# Patient Record
Sex: Female | Born: 1940 | Race: White | Hispanic: No | Smoking: Never smoker
Health system: Southern US, Community
[De-identification: ages and names within clinical notes are randomized; demographics above are authoritative.]

## PROBLEM LIST (undated history)

## (undated) DIAGNOSIS — Z923 Personal history of irradiation: Secondary | ICD-10-CM

## (undated) DIAGNOSIS — Z9889 Other specified postprocedural states: Secondary | ICD-10-CM

## (undated) DIAGNOSIS — Z8673 Personal history of transient ischemic attack (TIA), and cerebral infarction without residual deficits: Secondary | ICD-10-CM

## (undated) DIAGNOSIS — Z8619 Personal history of other infectious and parasitic diseases: Secondary | ICD-10-CM

## (undated) DIAGNOSIS — R112 Nausea with vomiting, unspecified: Secondary | ICD-10-CM

## (undated) DIAGNOSIS — E785 Hyperlipidemia, unspecified: Secondary | ICD-10-CM

## (undated) DIAGNOSIS — Z87440 Personal history of urinary (tract) infections: Secondary | ICD-10-CM

## (undated) DIAGNOSIS — K5790 Diverticulosis of intestine, part unspecified, without perforation or abscess without bleeding: Secondary | ICD-10-CM

## (undated) DIAGNOSIS — Z9289 Personal history of other medical treatment: Secondary | ICD-10-CM

## (undated) DIAGNOSIS — I10 Essential (primary) hypertension: Secondary | ICD-10-CM

## (undated) DIAGNOSIS — M6530 Trigger finger, unspecified finger: Secondary | ICD-10-CM

## (undated) DIAGNOSIS — C50919 Malignant neoplasm of unspecified site of unspecified female breast: Secondary | ICD-10-CM

## (undated) DIAGNOSIS — K649 Unspecified hemorrhoids: Secondary | ICD-10-CM

## (undated) DIAGNOSIS — Z5189 Encounter for other specified aftercare: Secondary | ICD-10-CM

## (undated) HISTORY — DX: Personal history of other infectious and parasitic diseases: Z86.19

## (undated) HISTORY — PX: BREAST LUMPECTOMY: SHX2

## (undated) HISTORY — PX: LAPAROSCOPIC HYSTERECTOMY: SHX1926

## (undated) HISTORY — PX: ESOPHAGOGASTRODUODENOSCOPY: SHX1529

## (undated) HISTORY — DX: Personal history of transient ischemic attack (TIA), and cerebral infarction without residual deficits: Z86.73

## (undated) HISTORY — DX: Trigger finger, unspecified finger: M65.30

## (undated) HISTORY — DX: Diverticulosis of intestine, part unspecified, without perforation or abscess without bleeding: K57.90

## (undated) HISTORY — PX: OTHER SURGICAL HISTORY: SHX169

## (undated) HISTORY — DX: Personal history of other medical treatment: Z92.89

## (undated) HISTORY — PX: ABDOMINAL HYSTERECTOMY: SHX81

## (undated) HISTORY — DX: Personal history of urinary (tract) infections: Z87.440

## (undated) HISTORY — PX: WRIST SURGERY: SHX841

## (undated) HISTORY — DX: Hyperlipidemia, unspecified: E78.5

## (undated) HISTORY — DX: Unspecified hemorrhoids: K64.9

---
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
## 1984-07-03 HISTORY — PX: BREAST BIOPSY: SHX20

## 1986-07-03 HISTORY — PX: BREAST BIOPSY: SHX20

## 1993-07-03 HISTORY — PX: BREAST BIOPSY: SHX20

## 2004-06-23 ENCOUNTER — Ambulatory Visit: Payer: Self-pay | Admitting: Internal Medicine

## 2004-07-12 ENCOUNTER — Ambulatory Visit: Payer: Self-pay | Admitting: Internal Medicine

## 2004-07-15 ENCOUNTER — Ambulatory Visit: Payer: Self-pay | Admitting: General Practice

## 2005-01-11 ENCOUNTER — Ambulatory Visit: Payer: Self-pay | Admitting: Internal Medicine

## 2005-03-22 ENCOUNTER — Ambulatory Visit: Payer: Self-pay | Admitting: General Practice

## 2005-07-13 ENCOUNTER — Ambulatory Visit: Payer: Self-pay | Admitting: Internal Medicine

## 2006-07-16 ENCOUNTER — Ambulatory Visit: Payer: Self-pay | Admitting: Internal Medicine

## 2007-07-04 DIAGNOSIS — C50919 Malignant neoplasm of unspecified site of unspecified female breast: Secondary | ICD-10-CM

## 2007-07-04 HISTORY — DX: Malignant neoplasm of unspecified site of unspecified female breast: C50.919

## 2007-07-04 HISTORY — PX: BREAST BIOPSY: SHX20

## 2007-07-18 ENCOUNTER — Ambulatory Visit: Payer: Self-pay | Admitting: Internal Medicine

## 2007-07-19 ENCOUNTER — Ambulatory Visit: Payer: Self-pay | Admitting: Internal Medicine

## 2007-08-07 ENCOUNTER — Ambulatory Visit: Payer: Self-pay | Admitting: General Surgery

## 2007-08-19 ENCOUNTER — Ambulatory Visit: Payer: Self-pay | Admitting: General Surgery

## 2007-08-26 ENCOUNTER — Ambulatory Visit: Payer: Self-pay | Admitting: Oncology

## 2007-09-01 ENCOUNTER — Ambulatory Visit: Payer: Self-pay | Admitting: Oncology

## 2007-09-02 ENCOUNTER — Ambulatory Visit: Payer: Self-pay | Admitting: Oncology

## 2007-10-02 ENCOUNTER — Ambulatory Visit: Payer: Self-pay | Admitting: Oncology

## 2007-11-01 ENCOUNTER — Ambulatory Visit: Payer: Self-pay | Admitting: Oncology

## 2007-12-02 ENCOUNTER — Ambulatory Visit: Payer: Self-pay | Admitting: Oncology

## 2008-01-01 ENCOUNTER — Ambulatory Visit: Payer: Self-pay | Admitting: Oncology

## 2008-02-01 ENCOUNTER — Ambulatory Visit: Payer: Self-pay | Admitting: Oncology

## 2008-03-03 ENCOUNTER — Ambulatory Visit: Payer: Self-pay | Admitting: Oncology

## 2008-03-18 ENCOUNTER — Ambulatory Visit: Payer: Self-pay | Admitting: General Surgery

## 2008-03-25 ENCOUNTER — Ambulatory Visit: Payer: Self-pay | Admitting: Oncology

## 2008-04-02 ENCOUNTER — Ambulatory Visit: Payer: Self-pay | Admitting: Oncology

## 2008-07-03 ENCOUNTER — Ambulatory Visit: Payer: Self-pay | Admitting: Radiation Oncology

## 2008-07-13 ENCOUNTER — Ambulatory Visit: Payer: Self-pay | Admitting: Radiation Oncology

## 2008-07-20 ENCOUNTER — Ambulatory Visit: Payer: Self-pay | Admitting: Oncology

## 2008-08-03 ENCOUNTER — Ambulatory Visit: Payer: Self-pay | Admitting: Radiation Oncology

## 2008-08-12 ENCOUNTER — Ambulatory Visit: Payer: Self-pay | Admitting: Radiation Oncology

## 2008-08-31 ENCOUNTER — Ambulatory Visit: Payer: Self-pay | Admitting: Radiation Oncology

## 2008-09-23 ENCOUNTER — Ambulatory Visit: Payer: Self-pay | Admitting: Oncology

## 2008-10-01 ENCOUNTER — Ambulatory Visit: Payer: Self-pay | Admitting: Radiation Oncology

## 2008-12-31 ENCOUNTER — Ambulatory Visit: Payer: Self-pay | Admitting: Oncology

## 2009-01-19 ENCOUNTER — Ambulatory Visit: Payer: Self-pay | Admitting: Oncology

## 2009-01-22 ENCOUNTER — Ambulatory Visit: Payer: Self-pay | Admitting: Oncology

## 2009-01-31 ENCOUNTER — Ambulatory Visit: Payer: Self-pay | Admitting: Oncology

## 2009-04-02 ENCOUNTER — Ambulatory Visit: Payer: Self-pay | Admitting: Oncology

## 2009-04-05 ENCOUNTER — Ambulatory Visit: Payer: Self-pay | Admitting: Oncology

## 2009-05-03 ENCOUNTER — Ambulatory Visit: Payer: Self-pay | Admitting: Oncology

## 2009-07-19 ENCOUNTER — Ambulatory Visit: Payer: Self-pay | Admitting: Internal Medicine

## 2009-10-01 ENCOUNTER — Ambulatory Visit: Payer: Self-pay | Admitting: Oncology

## 2009-10-06 ENCOUNTER — Ambulatory Visit: Payer: Self-pay | Admitting: Oncology

## 2009-10-31 ENCOUNTER — Ambulatory Visit: Payer: Self-pay | Admitting: Oncology

## 2009-12-31 ENCOUNTER — Ambulatory Visit: Payer: Self-pay | Admitting: Oncology

## 2010-01-07 ENCOUNTER — Ambulatory Visit: Payer: Self-pay | Admitting: Oncology

## 2010-01-31 ENCOUNTER — Ambulatory Visit: Payer: Self-pay | Admitting: Oncology

## 2010-02-16 ENCOUNTER — Ambulatory Visit: Payer: Self-pay | Admitting: Internal Medicine

## 2010-04-02 ENCOUNTER — Ambulatory Visit: Payer: Self-pay | Admitting: Oncology

## 2010-04-07 ENCOUNTER — Ambulatory Visit: Payer: Self-pay | Admitting: Oncology

## 2010-05-03 ENCOUNTER — Ambulatory Visit: Payer: Self-pay | Admitting: Oncology

## 2010-08-22 ENCOUNTER — Ambulatory Visit: Payer: Self-pay | Admitting: Internal Medicine

## 2010-08-26 ENCOUNTER — Ambulatory Visit: Payer: Self-pay | Admitting: Oncology

## 2010-09-01 ENCOUNTER — Ambulatory Visit: Payer: Self-pay | Admitting: Oncology

## 2011-01-20 ENCOUNTER — Ambulatory Visit: Payer: Self-pay | Admitting: Oncology

## 2011-02-01 ENCOUNTER — Ambulatory Visit: Payer: Self-pay | Admitting: Oncology

## 2011-03-31 ENCOUNTER — Ambulatory Visit: Payer: Self-pay | Admitting: Oncology

## 2011-04-03 ENCOUNTER — Ambulatory Visit: Payer: Self-pay | Admitting: Oncology

## 2011-08-24 ENCOUNTER — Ambulatory Visit: Payer: Self-pay | Admitting: Internal Medicine

## 2011-10-03 ENCOUNTER — Ambulatory Visit: Payer: Self-pay | Admitting: Oncology

## 2011-10-03 LAB — CBC CANCER CENTER
Basophil #: 0 x10 3/mm (ref 0.0–0.1)
Basophil %: 0.3 %
Eosinophil #: 0.1 x10 3/mm (ref 0.0–0.7)
HGB: 13.7 g/dL (ref 12.0–16.0)
Lymphocyte #: 1.1 x10 3/mm (ref 1.0–3.6)
Lymphocyte %: 16.3 %
MCHC: 34.8 g/dL (ref 32.0–36.0)
MCV: 90 fL (ref 80–100)
Neutrophil #: 4.9 x10 3/mm (ref 1.4–6.5)
Platelet: 306 x10 3/mm (ref 150–440)
RDW: 14.3 % (ref 11.5–14.5)

## 2011-10-03 LAB — COMPREHENSIVE METABOLIC PANEL
Albumin: 4 g/dL (ref 3.4–5.0)
Alkaline Phosphatase: 94 U/L (ref 50–136)
Anion Gap: 9 (ref 7–16)
BUN: 10 mg/dL (ref 7–18)
Bilirubin,Total: 0.5 mg/dL (ref 0.2–1.0)
Chloride: 100 mmol/L (ref 98–107)
Co2: 30 mmol/L (ref 21–32)
EGFR (African American): >60
EGFR (Non-African Amer.): >60
Glucose: 86 mg/dL (ref 65–99)
Osmolality: 276 (ref 275–301)
Potassium: 4 mmol/L (ref 3.5–5.1)
Sodium: 139 mmol/L (ref 136–145)
Total Protein: 7.6 g/dL (ref 6.4–8.2)

## 2011-11-01 ENCOUNTER — Ambulatory Visit: Payer: Self-pay | Admitting: Oncology

## 2012-01-26 ENCOUNTER — Ambulatory Visit: Payer: Self-pay | Admitting: Radiation Oncology

## 2012-02-01 ENCOUNTER — Ambulatory Visit: Payer: Self-pay | Admitting: Radiation Oncology

## 2012-04-02 ENCOUNTER — Ambulatory Visit: Payer: Self-pay | Admitting: Oncology

## 2012-04-02 LAB — CBC CANCER CENTER
Basophil #: 0 x10 3/mm (ref 0.0–0.1)
HCT: 42.5 % (ref 35.0–47.0)
HGB: 14.1 g/dL (ref 12.0–16.0)
Lymphocyte #: 1.3 x10 3/mm (ref 1.0–3.6)
Lymphocyte %: 21.3 %
MCH: 30 pg (ref 26.0–34.0)
Monocyte #: 0.4 x10 3/mm (ref 0.2–0.9)
Monocyte %: 7.3 %
Neutrophil %: 68.3 %
Platelet: 297 x10 3/mm (ref 150–440)
RBC: 4.69 10*6/uL (ref 3.80–5.20)
RDW: 13.4 % (ref 11.5–14.5)
WBC: 5.9 x10 3/mm (ref 3.6–11.0)

## 2012-04-02 LAB — COMPREHENSIVE METABOLIC PANEL
Albumin: 4 g/dL (ref 3.4–5.0)
Alkaline Phosphatase: 95 U/L (ref 50–136)
Anion Gap: 13 (ref 7–16)
BUN: 10 mg/dL (ref 7–18)
Bilirubin,Total: 0.5 mg/dL (ref 0.2–1.0)
Calcium, Total: 9.3 mg/dL (ref 8.5–10.1)
Creatinine: 0.76 mg/dL (ref 0.60–1.30)
Glucose: 100 mg/dL — ABNORMAL HIGH (ref 65–99)
Potassium: 3.9 mmol/L (ref 3.5–5.1)
SGOT(AST): 19 U/L (ref 15–37)
SGPT (ALT): 18 U/L (ref 12–78)
Total Protein: 7.7 g/dL (ref 6.4–8.2)

## 2012-05-03 ENCOUNTER — Ambulatory Visit: Payer: Self-pay | Admitting: Oncology

## 2012-08-27 ENCOUNTER — Ambulatory Visit: Payer: Self-pay | Admitting: Internal Medicine

## 2012-10-01 ENCOUNTER — Ambulatory Visit: Payer: Self-pay | Admitting: Oncology

## 2012-10-01 LAB — COMPREHENSIVE METABOLIC PANEL
Alkaline Phosphatase: 93 U/L (ref 50–136)
Bilirubin,Total: 0.6 mg/dL (ref 0.2–1.0)
Calcium, Total: 9 mg/dL (ref 8.5–10.1)
Co2: 30 mmol/L (ref 21–32)
Creatinine: 0.87 mg/dL (ref 0.60–1.30)
EGFR (African American): >60
EGFR (Non-African Amer.): >60
Osmolality: 280 (ref 275–301)
Sodium: 141 mmol/L (ref 136–145)

## 2012-10-01 LAB — CBC CANCER CENTER
Basophil #: 0 x10 3/mm (ref 0.0–0.1)
Basophil %: 0.9 %
HCT: 41.8 % (ref 35.0–47.0)
Lymphocyte %: 23 %
MCV: 89 fL (ref 80–100)
Monocyte #: 0.5 x10 3/mm (ref 0.2–0.9)
Monocyte %: 9.5 %
Neutrophil #: 3.6 x10 3/mm (ref 1.4–6.5)
RBC: 4.7 10*6/uL (ref 3.80–5.20)
RDW: 14.2 % (ref 11.5–14.5)
WBC: 5.6 x10 3/mm (ref 3.6–11.0)

## 2012-10-02 LAB — CANCER ANTIGEN 27.29: CA 27.29: 29.4 U/mL (ref 0.0–38.6)

## 2012-10-31 ENCOUNTER — Ambulatory Visit: Payer: Self-pay | Admitting: Oncology

## 2012-12-31 ENCOUNTER — Ambulatory Visit: Payer: Self-pay | Admitting: Oncology

## 2013-01-31 ENCOUNTER — Ambulatory Visit: Payer: Self-pay | Admitting: Oncology

## 2013-06-05 ENCOUNTER — Ambulatory Visit: Payer: Self-pay | Admitting: Internal Medicine

## 2013-06-06 ENCOUNTER — Ambulatory Visit: Payer: Self-pay | Admitting: Internal Medicine

## 2013-07-31 ENCOUNTER — Ambulatory Visit: Payer: Self-pay | Admitting: Gastroenterology

## 2013-07-31 HISTORY — PX: COLONOSCOPY: SHX174

## 2013-09-03 ENCOUNTER — Ambulatory Visit: Payer: Self-pay | Admitting: Internal Medicine

## 2013-10-01 ENCOUNTER — Ambulatory Visit: Payer: Self-pay | Admitting: Oncology

## 2013-10-01 LAB — COMPREHENSIVE METABOLIC PANEL
ALBUMIN: 3.8 g/dL (ref 3.4–5.0)
ALT: 17 U/L (ref 12–78)
Alkaline Phosphatase: 99 U/L
Anion Gap: 8 (ref 7–16)
BUN: 11 mg/dL (ref 7–18)
Bilirubin,Total: 0.5 mg/dL (ref 0.2–1.0)
Calcium, Total: 9.2 mg/dL (ref 8.5–10.1)
Chloride: 101 mmol/L (ref 98–107)
Co2: 29 mmol/L (ref 21–32)
Creatinine: 0.71 mg/dL (ref 0.60–1.30)
EGFR (African American): >60
EGFR (Non-African Amer.): >60
Glucose: 102 mg/dL — ABNORMAL HIGH (ref 65–99)
OSMOLALITY: 275 (ref 275–301)
Potassium: 4.2 mmol/L (ref 3.5–5.1)
SGOT(AST): 21 U/L (ref 15–37)
Sodium: 138 mmol/L (ref 136–145)
TOTAL PROTEIN: 7.4 g/dL (ref 6.4–8.2)

## 2013-10-01 LAB — CBC CANCER CENTER
Basophil #: 0.1 x10 3/mm (ref 0.0–0.1)
Basophil %: 0.9 %
Eosinophil #: 0.2 x10 3/mm (ref 0.0–0.7)
Eosinophil %: 2.3 %
HCT: 40.5 % (ref 35.0–47.0)
HGB: 13.6 g/dL (ref 12.0–16.0)
LYMPHS ABS: 1.2 x10 3/mm (ref 1.0–3.6)
LYMPHS PCT: 16.2 %
MCH: 29.6 pg (ref 26.0–34.0)
MCHC: 33.6 g/dL (ref 32.0–36.0)
MCV: 88 fL (ref 80–100)
Monocyte #: 0.5 x10 3/mm (ref 0.2–0.9)
Monocyte %: 6.5 %
Neutrophil #: 5.6 x10 3/mm (ref 1.4–6.5)
Neutrophil %: 74.1 %
Platelet: 322 x10 3/mm (ref 150–440)
RBC: 4.59 10*6/uL (ref 3.80–5.20)
RDW: 14 % (ref 11.5–14.5)
WBC: 7.6 x10 3/mm (ref 3.6–11.0)

## 2013-10-02 LAB — CANCER ANTIGEN 27.29: CA 27.29: 21 U/mL (ref 0.0–38.6)

## 2013-10-31 ENCOUNTER — Ambulatory Visit: Payer: Self-pay | Admitting: Oncology

## 2013-11-20 DIAGNOSIS — E7800 Pure hypercholesterolemia, unspecified: Secondary | ICD-10-CM | POA: Insufficient documentation

## 2014-03-26 ENCOUNTER — Emergency Department: Payer: Self-pay | Admitting: Emergency Medicine

## 2014-09-07 ENCOUNTER — Ambulatory Visit: Payer: Self-pay | Admitting: Internal Medicine

## 2014-09-17 ENCOUNTER — Emergency Department: Payer: Self-pay | Admitting: Internal Medicine

## 2014-10-05 ENCOUNTER — Ambulatory Visit
Admit: 2014-10-05 | Disposition: A | Discharge status: Home / Self Care | Payer: Self-pay | Attending: Oncology | Admitting: Oncology

## 2014-10-05 LAB — CBC CANCER CENTER
BASOS ABS: 0.1 x10 3/mm (ref 0.0–0.1)
BASOS PCT: 1.1 %
EOS PCT: 3.5 %
Eosinophil #: 0.2 x10 3/mm (ref 0.0–0.7)
HCT: 40.7 % (ref 35.0–47.0)
HGB: 13.8 g/dL (ref 12.0–16.0)
Lymphocyte #: 1.3 x10 3/mm (ref 1.0–3.6)
Lymphocyte %: 25.2 %
MCH: 30.1 pg (ref 26.0–34.0)
MCHC: 34 g/dL (ref 32.0–36.0)
MCV: 89 fL (ref 80–100)
MONOS PCT: 7.1 %
Monocyte #: 0.4 x10 3/mm (ref 0.2–0.9)
NEUTROS ABS: 3.2 x10 3/mm (ref 1.4–6.5)
Neutrophil %: 63.1 %
Platelet: 394 x10 3/mm (ref 150–440)
RBC: 4.6 10*6/uL (ref 3.80–5.20)
RDW: 14.3 % (ref 11.5–14.5)
WBC: 5 x10 3/mm (ref 3.6–11.0)

## 2014-10-05 LAB — COMPREHENSIVE METABOLIC PANEL
ALBUMIN: 4.6 g/dL
ANION GAP: 7 (ref 7–16)
Alkaline Phosphatase: 110 U/L
BUN: 10 mg/dL
Bilirubin,Total: 0.7 mg/dL
CALCIUM: 9.1 mg/dL
CO2: 29 mmol/L
Chloride: 96 mmol/L — ABNORMAL LOW
Creatinine: 0.65 mg/dL
EGFR (Non-African Amer.): >60
Glucose: 132 mg/dL — ABNORMAL HIGH
POTASSIUM: 3.7 mmol/L
SGOT(AST): 25 U/L
SGPT (ALT): 19 U/L
Sodium: 132 mmol/L — ABNORMAL LOW
Total Protein: 7.6 g/dL

## 2014-11-04 ENCOUNTER — Other Ambulatory Visit: Payer: Self-pay | Admitting: *Deleted

## 2014-11-04 DIAGNOSIS — Z853 Personal history of malignant neoplasm of breast: Secondary | ICD-10-CM

## 2015-09-13 ENCOUNTER — Ambulatory Visit
Admission: RE | Admit: 2015-09-13 | Discharge: 2015-09-13 | Disposition: A | Discharge status: Home / Self Care | Payer: Commercial Managed Care - HMO | Source: Ambulatory Visit | Attending: Oncology | Admitting: Oncology

## 2015-09-13 ENCOUNTER — Other Ambulatory Visit: Payer: Self-pay | Admitting: Oncology

## 2015-09-13 DIAGNOSIS — Z853 Personal history of malignant neoplasm of breast: Secondary | ICD-10-CM

## 2015-09-13 DIAGNOSIS — Z1231 Encounter for screening mammogram for malignant neoplasm of breast: Secondary | ICD-10-CM | POA: Insufficient documentation

## 2015-09-13 HISTORY — DX: Malignant neoplasm of unspecified site of unspecified female breast: C50.919

## 2015-10-11 ENCOUNTER — Other Ambulatory Visit: Payer: Self-pay

## 2015-10-11 ENCOUNTER — Ambulatory Visit: Payer: Self-pay | Admitting: Oncology

## 2015-11-01 ENCOUNTER — Other Ambulatory Visit: Payer: Self-pay | Admitting: *Deleted

## 2015-11-01 DIAGNOSIS — C50919 Malignant neoplasm of unspecified site of unspecified female breast: Secondary | ICD-10-CM

## 2015-11-04 ENCOUNTER — Encounter: Payer: Self-pay | Admitting: *Deleted

## 2015-11-04 ENCOUNTER — Inpatient Hospital Stay: Payer: Commercial Managed Care - HMO | Attending: Internal Medicine | Admitting: Internal Medicine

## 2015-11-04 ENCOUNTER — Other Ambulatory Visit: Payer: Self-pay | Admitting: *Deleted

## 2015-11-04 ENCOUNTER — Inpatient Hospital Stay: Payer: Commercial Managed Care - HMO

## 2015-11-04 ENCOUNTER — Encounter: Payer: Self-pay | Admitting: Internal Medicine

## 2015-11-04 VITALS — BP 147/85 | HR 75 | Temp 95.5°F | Resp 20 | Wt 135.5 lb

## 2015-11-04 DIAGNOSIS — Z9223 Personal history of estrogen therapy: Secondary | ICD-10-CM | POA: Diagnosis not present

## 2015-11-04 DIAGNOSIS — C50912 Malignant neoplasm of unspecified site of left female breast: Secondary | ICD-10-CM

## 2015-11-04 DIAGNOSIS — Z853 Personal history of malignant neoplasm of breast: Secondary | ICD-10-CM | POA: Diagnosis not present

## 2015-11-04 DIAGNOSIS — Z923 Personal history of irradiation: Secondary | ICD-10-CM | POA: Diagnosis not present

## 2015-11-04 DIAGNOSIS — Z170 Estrogen receptor positive status [ER+]: Secondary | ICD-10-CM

## 2015-11-04 LAB — CBC WITH DIFFERENTIAL/PLATELET
Basophils Absolute: 0 10*3/uL (ref 0–0.1)
Basophils Relative: 1 %
EOS ABS: 0.2 10*3/uL (ref 0–0.7)
EOS PCT: 4 %
HCT: 41.2 % (ref 35.0–47.0)
Hemoglobin: 14.2 g/dL (ref 12.0–16.0)
LYMPHS ABS: 1.2 10*3/uL (ref 1.0–3.6)
Lymphocytes Relative: 24 %
MCH: 30.2 pg (ref 26.0–34.0)
MCHC: 34.4 g/dL (ref 32.0–36.0)
MCV: 87.7 fL (ref 80.0–100.0)
MONO ABS: 0.5 10*3/uL (ref 0.2–0.9)
Monocytes Relative: 9 %
Neutro Abs: 3.1 10*3/uL (ref 1.4–6.5)
Neutrophils Relative %: 62 %
Platelets: 301 10*3/uL (ref 150–440)
RBC: 4.7 MIL/uL (ref 3.80–5.20)
RDW: 15.4 % — AB (ref 11.5–14.5)
WBC: 5 10*3/uL (ref 3.6–11.0)

## 2015-11-04 LAB — COMPREHENSIVE METABOLIC PANEL
ALT: 12 U/L — AB (ref 14–54)
AST: 20 U/L (ref 15–41)
Albumin: 4.3 g/dL (ref 3.5–5.0)
Alkaline Phosphatase: 81 U/L (ref 38–126)
Anion gap: 8 (ref 5–15)
BILIRUBIN TOTAL: 0.8 mg/dL (ref 0.3–1.2)
BUN: 12 mg/dL (ref 6–20)
CO2: 29 mmol/L (ref 22–32)
CREATININE: 0.63 mg/dL (ref 0.44–1.00)
Calcium: 9.4 mg/dL (ref 8.9–10.3)
Chloride: 100 mmol/L — ABNORMAL LOW (ref 101–111)
GFR calc Af Amer: >60 mL/min (ref >60)
GLUCOSE: 99 mg/dL (ref 65–99)
Potassium: 4.1 mmol/L (ref 3.5–5.1)
Sodium: 137 mmol/L (ref 135–145)
TOTAL PROTEIN: 7.4 g/dL (ref 6.5–8.1)

## 2015-11-04 NOTE — Progress Notes (Signed)
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 I reviewed the patient's prior charts/pertinent labs/imaging in detail; findings are summarized above.   Patient is doing well. Denies any unusual chest pain or shortness of breath or cough. No bone pain. She continues to work as a Administrator, arts on Fridays.   REVIEW OF SYSTEMS:  A complete 10 point review of system is done which is negative except mentioned above/history of present illness.   PAST MEDICAL HISTORY :  Past Medical History  Diagnosis Date  . Breast cancer ([place]) 2009    left breast, radiation  . History of bladder infections     PAST SURGICAL HISTORY :   Past Surgical History  Procedure Laterality Date  . Breast biopsy Left 1986    neg  . Breast biopsy Left 1988    neg  . Breast biopsy Left 1995    neg  . Breast lumpectomy    . Appendectomy    . Laparoscopic hysterectomy      FAMILY HISTORY :  No family history on file.  SOCIAL HISTORY:   Social History  Substance Use Topics  . Smoking status: Never Smoker   . Smokeless tobacco: Never Used  . Alcohol Use: No    ALLERGIES:  is allergic to levofloxacin and other.  MEDICATIONS:  Current Outpatient Prescriptions  Medication Sig Dispense Refill  . aspirin EC 81 MG tablet Take 81 mg by mouth.    . diphenhydrAMINE (BENADRYL) 25 mg capsule Take 25 mg by mouth Nightly.    . gabapentin (NEURONTIN) 300 MG capsule 300 mg 3 (three) times daily.    Marland Kitchen guaiFENesin-codeine (ROBITUSSIN AC) 100-10 MG/5ML syrup Take 5 mLs by mouth 2 (two) times daily.    . pravastatin (PRAVACHOL) 40 MG  tablet 40 mg.     No current facility-administered medications for this visit.    PHYSICAL EXAMINATION: ECOG PERFORMANCE STATUS: 0 - Asymptomatic  BP 147/85 mmHg  Pulse 75  Temp(Src) 95.5 F (35.3 C) (Tympanic)  Wt 135 lb 7.6 oz (61.45 kg)  Filed Weights   11/04/15 1119  Weight: 135 lb 7.6 oz (61.45 kg)    GENERAL: Well-nourished well-developed; Alert, no distress and comfortable.  Alone. EYES: no pallor or icterus OROPHARYNX: no thrush or ulceration; good dentition  NECK: supple, no masses felt LYMPH:  no palpable lymphadenopathy in the cervical, axillary or inguinal regions LUNGS: clear to auscultation and  No wheeze or crackles HEART/CVS: regular rate & rhythm and no murmurs; No lower extremity edema ABDOMEN:abdomen soft, non-tender and normal bowel sounds Musculoskeletal:no cyanosis of digits and no clubbing  PSYCH: alert & oriented x 3 with fluent speech NEURO: no focal motor/sensory deficits SKIN:  no rashes or significant lesions Right and left BREAST exam [in the presence of nurse]- no unusual skin changes or dominant masses felt. Surgical scar left outer upper quadrant noted.    LABORATORY DATA:  I have reviewed the data as listed    Component Value Date/Time   NA 137 11/04/2015 1031   NA 132* 10/05/2014 0951   K 4.1 11/04/2015 1031   K 3.7 10/05/2014  0951   CL 100* 11/04/2015 1031   CL 96* 10/05/2014 0951   CO2 29 11/04/2015 1031   CO2 29 10/05/2014 0951   GLUCOSE 99 11/04/2015 1031   GLUCOSE 132* 10/05/2014 0951   BUN 12 11/04/2015 1031   BUN 10 10/05/2014 0951   CREATININE 0.63 11/04/2015 1031   CREATININE 0.65 10/05/2014 0951   CALCIUM 9.4 11/04/2015 1031   CALCIUM 9.1 10/05/2014 0951   PROT 7.4 11/04/2015 1031   PROT 7.6 10/05/2014 0951   ALBUMIN 4.3 11/04/2015 1031   ALBUMIN 4.6 10/05/2014 0951   AST 20 11/04/2015 1031   AST 25 10/05/2014 0951   ALT 12* 11/04/2015 1031   ALT 19 10/05/2014 0951   ALKPHOS 81 11/04/2015 1031   ALKPHOS 110  10/05/2014 0951   BILITOT 0.8 11/04/2015 1031   BILITOT 0.7 10/05/2014 0951   GFRNONAA >60 11/04/2015 1031   GFRNONAA >60 10/05/2014 0951   GFRNONAA >60 10/03/2011 1117   GFRAA >60 11/04/2015 1031   GFRAA >60 10/05/2014 0951   GFRAA >60 10/03/2011 1117    No results found for: SPEP, UPEP  Lab Results  Component Value Date   WBC 5.0 11/04/2015   NEUTROABS 3.1 11/04/2015   HGB 14.2 11/04/2015   HCT 41.2 11/04/2015   MCV 87.7 11/04/2015   PLT 301 11/04/2015      Chemistry      Component Value Date/Time   NA 137 11/04/2015 1031   NA 132* 10/05/2014 0951   K 4.1 11/04/2015 1031   K 3.7 10/05/2014 0951   CL 100* 11/04/2015 1031   CL 96* 10/05/2014 0951   CO2 29 11/04/2015 1031   CO2 29 10/05/2014 0951   BUN 12 11/04/2015 1031   BUN 10 10/05/2014 0951   CREATININE 0.63 11/04/2015 1031   CREATININE 0.65 10/05/2014 0951      Component Value Date/Time   CALCIUM 9.4 11/04/2015 1031   CALCIUM 9.1 10/05/2014 0951   ALKPHOS 81 11/04/2015 1031   ALKPHOS 110 10/05/2014 0951   AST 20 11/04/2015 1031   AST 25 10/05/2014 0951   ALT 12* 11/04/2015 1031   ALT 19 10/05/2014 0951   BILITOT 0.8 11/04/2015 1031   BILITOT 0.7 10/05/2014 0951         ASSESSMENT & PLAN:   # 2008 STAGE I LEFT BREAST CA-  s/p Lumpec & RT ER/PR Pos; her 2 NEG; No Chemo; Arimidex 5 years [Dr.Choksi]. Clinically no dense recurrence. Recent mammogram March 2017 normal.   # Patient will follow-up with [place] in approximately one year/labs;  mammogram to be done through her PCP. CBC CMP revealed within normal limits.  All questions were answered. The patient knows to call the clinic with any problems, questions or concerns.      Cammie Sickle, MD 11/04/2015 11:29 AM

## 2015-11-04 NOTE — Progress Notes (Signed)
Rn Chaperoned provider with Breast Exam

## 2015-11-04 NOTE — Progress Notes (Signed)
Patient ambulates without assistance, brought to exam room 6.  Patient denies pain or discomfort, medication record updated information provided by patient.

## 2015-12-29 DIAGNOSIS — B0229 Other postherpetic nervous system involvement: Secondary | ICD-10-CM | POA: Insufficient documentation

## 2016-01-07 ENCOUNTER — Encounter: Payer: Self-pay | Admitting: *Deleted

## 2016-01-07 ENCOUNTER — Observation Stay
Admission: EM | Admit: 2016-01-07 | Discharge: 2016-01-10 | Disposition: A | Discharge status: Home / Self Care | Payer: Commercial Managed Care - HMO | Attending: Internal Medicine | Admitting: Internal Medicine

## 2016-01-07 DIAGNOSIS — Z8049 Family history of malignant neoplasm of other genital organs: Secondary | ICD-10-CM | POA: Insufficient documentation

## 2016-01-07 DIAGNOSIS — Z853 Personal history of malignant neoplasm of breast: Secondary | ICD-10-CM | POA: Diagnosis not present

## 2016-01-07 DIAGNOSIS — K640 First degree hemorrhoids: Secondary | ICD-10-CM | POA: Diagnosis not present

## 2016-01-07 DIAGNOSIS — Z8673 Personal history of transient ischemic attack (TIA), and cerebral infarction without residual deficits: Secondary | ICD-10-CM | POA: Diagnosis not present

## 2016-01-07 DIAGNOSIS — Z9049 Acquired absence of other specified parts of digestive tract: Secondary | ICD-10-CM | POA: Diagnosis not present

## 2016-01-07 DIAGNOSIS — Z87440 Personal history of urinary (tract) infections: Secondary | ICD-10-CM | POA: Insufficient documentation

## 2016-01-07 DIAGNOSIS — K2970 Gastritis, unspecified, without bleeding: Secondary | ICD-10-CM | POA: Insufficient documentation

## 2016-01-07 DIAGNOSIS — K5730 Diverticulosis of large intestine without perforation or abscess without bleeding: Secondary | ICD-10-CM | POA: Diagnosis not present

## 2016-01-07 DIAGNOSIS — Z8042 Family history of malignant neoplasm of prostate: Secondary | ICD-10-CM | POA: Insufficient documentation

## 2016-01-07 DIAGNOSIS — C50919 Malignant neoplasm of unspecified site of unspecified female breast: Secondary | ICD-10-CM | POA: Diagnosis not present

## 2016-01-07 DIAGNOSIS — Z923 Personal history of irradiation: Secondary | ICD-10-CM | POA: Diagnosis not present

## 2016-01-07 DIAGNOSIS — E785 Hyperlipidemia, unspecified: Secondary | ICD-10-CM | POA: Diagnosis not present

## 2016-01-07 DIAGNOSIS — Z823 Family history of stroke: Secondary | ICD-10-CM | POA: Diagnosis not present

## 2016-01-07 DIAGNOSIS — Z7982 Long term (current) use of aspirin: Secondary | ICD-10-CM | POA: Diagnosis not present

## 2016-01-07 DIAGNOSIS — Z881 Allergy status to other antibiotic agents status: Secondary | ICD-10-CM | POA: Insufficient documentation

## 2016-01-07 DIAGNOSIS — Z8249 Family history of ischemic heart disease and other diseases of the circulatory system: Secondary | ICD-10-CM | POA: Diagnosis not present

## 2016-01-07 DIAGNOSIS — K921 Melena: Secondary | ICD-10-CM | POA: Diagnosis not present

## 2016-01-07 DIAGNOSIS — K625 Hemorrhage of anus and rectum: Secondary | ICD-10-CM | POA: Diagnosis present

## 2016-01-07 DIAGNOSIS — K922 Gastrointestinal hemorrhage, unspecified: Secondary | ICD-10-CM

## 2016-01-07 DIAGNOSIS — Z8379 Family history of other diseases of the digestive system: Secondary | ICD-10-CM | POA: Insufficient documentation

## 2016-01-07 DIAGNOSIS — Z79899 Other long term (current) drug therapy: Secondary | ICD-10-CM | POA: Diagnosis not present

## 2016-01-07 HISTORY — DX: Encounter for other specified aftercare: Z51.89

## 2016-01-07 LAB — COMPREHENSIVE METABOLIC PANEL
ALT: 14 U/L (ref 14–54)
AST: 21 U/L (ref 15–41)
Albumin: 4.4 g/dL (ref 3.5–5.0)
Alkaline Phosphatase: 85 U/L (ref 38–126)
Anion gap: 7 (ref 5–15)
BILIRUBIN TOTAL: 0.3 mg/dL (ref 0.3–1.2)
BUN: 15 mg/dL (ref 6–20)
CHLORIDE: 102 mmol/L (ref 101–111)
CO2: 30 mmol/L (ref 22–32)
CREATININE: 0.7 mg/dL (ref 0.44–1.00)
Calcium: 9.5 mg/dL (ref 8.9–10.3)
Glucose, Bld: 119 mg/dL — ABNORMAL HIGH (ref 65–99)
POTASSIUM: 3.9 mmol/L (ref 3.5–5.1)
Sodium: 139 mmol/L (ref 135–145)
TOTAL PROTEIN: 7.4 g/dL (ref 6.5–8.1)

## 2016-01-07 LAB — CBC WITH DIFFERENTIAL/PLATELET
Basophils Absolute: 0.1 10*3/uL (ref 0–0.1)
Basophils Relative: 1 %
EOS PCT: 4 %
Eosinophils Absolute: 0.3 10*3/uL (ref 0–0.7)
HCT: 38.2 % (ref 35.0–47.0)
Hemoglobin: 13.1 g/dL (ref 12.0–16.0)
LYMPHS ABS: 1.5 10*3/uL (ref 1.0–3.6)
LYMPHS PCT: 23 %
MCH: 30.6 pg (ref 26.0–34.0)
MCHC: 34.2 g/dL (ref 32.0–36.0)
MCV: 89.3 fL (ref 80.0–100.0)
MONO ABS: 0.6 10*3/uL (ref 0.2–0.9)
Monocytes Relative: 9 %
Neutro Abs: 4.1 10*3/uL (ref 1.4–6.5)
Neutrophils Relative %: 63 %
PLATELETS: 291 10*3/uL (ref 150–440)
RBC: 4.27 MIL/uL (ref 3.80–5.20)
RDW: 13.5 % (ref 11.5–14.5)
WBC: 6.4 10*3/uL (ref 3.6–11.0)

## 2016-01-07 LAB — PROTIME-INR
INR: 1.11
PROTHROMBIN TIME: 14.5 s (ref 11.4–15.0)

## 2016-01-07 LAB — TYPE AND SCREEN
ABO/RH(D): O POS
Antibody Screen: NEGATIVE

## 2016-01-07 LAB — APTT: APTT: 34 s (ref 24–36)

## 2016-01-07 MED ORDER — SODIUM CHLORIDE 0.9 % IV BOLUS (SEPSIS)
500.0000 mL | Freq: Once | INTRAVENOUS | Status: AC
  Administered 2016-01-07: 500 mL via INTRAVENOUS

## 2016-01-07 MED ORDER — GABAPENTIN 300 MG PO CAPS
300.0000 mg | ORAL_CAPSULE | Freq: Three times a day (TID) | ORAL | Status: DC
  Administered 2016-01-07 – 2016-01-09 (×6): 300 mg via ORAL
  Filled 2016-01-07 (×6): qty 1

## 2016-01-07 MED ORDER — DOCUSATE SODIUM 100 MG PO CAPS: 100.0000 mg | ORAL_CAPSULE | Freq: Two times a day (BID) | ORAL | Status: DC

## 2016-01-07 MED ORDER — HYDROCODONE-ACETAMINOPHEN 5-325 MG PO TABS: 1.0000 | ORAL_TABLET | ORAL | Status: DC | PRN

## 2016-01-07 MED ORDER — ACETAMINOPHEN 650 MG RE SUPP: 650.0000 mg | Freq: Four times a day (QID) | RECTAL | Status: DC | PRN

## 2016-01-07 MED ORDER — TRAZODONE HCL 50 MG PO TABS: 25.0000 mg | ORAL_TABLET | Freq: Every evening | ORAL | Status: DC | PRN

## 2016-01-07 MED ORDER — ONDANSETRON HCL 4 MG/2ML IJ SOLN: 4.0000 mg | Freq: Four times a day (QID) | INTRAMUSCULAR | Status: DC | PRN

## 2016-01-07 MED ORDER — DIPHENHYDRAMINE HCL 25 MG PO CAPS
25.0000 mg | ORAL_CAPSULE | Freq: Four times a day (QID) | ORAL | Status: DC | PRN
  Administered 2016-01-07 – 2016-01-08 (×2): 25 mg via ORAL
  Filled 2016-01-07 (×2): qty 1

## 2016-01-07 MED ORDER — BISACODYL 5 MG PO TBEC
5.0000 mg | DELAYED_RELEASE_TABLET | Freq: Every day | ORAL | Status: DC | PRN
  Filled 2016-01-07: qty 1

## 2016-01-07 MED ORDER — ACETAMINOPHEN 325 MG PO TABS
650.0000 mg | ORAL_TABLET | Freq: Four times a day (QID) | ORAL | Status: DC | PRN
  Administered 2016-01-08 – 2016-01-09 (×2): 650 mg via ORAL
  Filled 2016-01-07 (×2): qty 2

## 2016-01-07 MED ORDER — PRAVASTATIN SODIUM 40 MG PO TABS
40.0000 mg | ORAL_TABLET | Freq: Every day | ORAL | Status: DC
  Administered 2016-01-07: 40 mg via ORAL
  Filled 2016-01-07 (×2): qty 1

## 2016-01-07 MED ORDER — SODIUM CHLORIDE 0.9 % IV SOLN: INTRAVENOUS | Status: DC

## 2016-01-07 MED ORDER — ONDANSETRON HCL 4 MG PO TABS: 4.0000 mg | ORAL_TABLET | Freq: Four times a day (QID) | ORAL | Status: DC | PRN

## 2016-01-07 MED ORDER — FAMOTIDINE IN NACL 20-0.9 MG/50ML-% IV SOLN
20.0000 mg | Freq: Two times a day (BID) | INTRAVENOUS | Status: DC
  Administered 2016-01-07 – 2016-01-09 (×5): 20 mg via INTRAVENOUS
  Filled 2016-01-07 (×7): qty 50

## 2016-01-07 NOTE — ED Notes (Addendum)
Pt to ED via POV after blood in stools began Wednesday morning. Pt has had two blood draws past to days to check H&H with Ronney Asters, PA..  hemoglobin dropped one point today and sent to ED today for an admission. Pt denies any other s/s with this. Vitals stable, NAD noted.

## 2016-01-07 NOTE — H&P (Signed)
[facility] [place] NAME: [name]    MR#:  [id number]  DATE OF BIRTH:  08-09-1940  DATE OF ADMISSION:  01/07/2016  PRIMARY CARE PHYSICIAN: [name], MD   REQUESTING/REFERRING PHYSICIAN: Dr. Marcelene Butte  CHIEF COMPLAINT: Sent in by primary GI doctor for hemoglobin drop    Chief Complaint  Patient presents with  . Rectal Bleeding    HISTORY OF PRESENT ILLNESS:  [name] [name]  is a 75 y.o. female with a known history of Diverticulosis before, hyperlipidemia, hemorrhoids before comes in because of rectal bleeding. Patient is having the episodes of black stool since Wednesday ,she had 8 bms on wedndesday. Patient went to see gastroenterology Dr. Anastasio Champion. The patient has no abdominal pain. No prior history of GI bleed. Denies any use of NSAID's. Takes only ASA. She is not taking aspirin since Wednesday. She indeed have a hemoglobin checked wednesdayt and Thursday, today she was sent to emergency room because of one point drop in hemoglobin. Hemoglobin was 12.7 yesterday and states 11.9 today in office.but here hb is  Good at 13.1.but because of her black stool  will keep her overnight.pt denies any chest  Pain or abdominal pain.Marland Kitchen  PAST MEDICAL HISTORY:   Past Medical History  Diagnosis Date  . History of bladder infections   . History of CVA (cerebrovascular accident)   . Hyperlipidemia   . Trigger finger   . Diverticulosis   . H/O bone density study     05/30/06; 06/04/08; 06/05/12  . History of chicken pox   . Hemorrhoids   . Diverticulosis   . Breast cancer ([place]) 2009    left breast, radiation  . Blood transfusion without reported diagnosis     PAST SURGICAL HISTOIRY:   Past Surgical History  Procedure Laterality Date  . Breast biopsy Left 1986    neg  . Breast biopsy Left 1988    neg  . Breast biopsy Left 1995    neg  . Breast lumpectomy    . Appendectomy  1954  . Laparoscopic hysterectomy    .  Tonsillectomy  1954  . Wrist surgery  2009, 2006    ganglion cysts removal  . Esophagogastroduodenoscopy  1/292015  . Colonoscopy  07/31/2013  . Eye lid lift      SOCIAL HISTORY:   Social History  Substance Use Topics  . Smoking status: Never Smoker   . Smokeless tobacco: Never Used  . Alcohol Use: No    FAMILY HISTORY:   Family History  Problem Relation Age of Onset  . Prostate cancer Brother   . Crohn's disease Brother   . Heart attack Father   . Prostate cancer Father   . Vaginal cancer Maternal Grandmother   . Stroke Mother     DRUG ALLERGIES:   Allergies  Allergen Reactions  . Levofloxacin Other (See Comments)  . Other Nausea And Vomiting    REVIEW OF SYSTEMS:  CONSTITUTIONAL: No fever, fatigue or weakness.  EYES: No blurred or double vision.  EARS, NOSE, AND THROAT: No tinnitus or ear pain.  RESPIRATORY: No cough, shortness of breath, wheezing or hemoptysis.  CARDIOVASCULAR: No chest pain, orthopnea, edema.  GASTROINTESTINAL: No nausea, vomiting, diarrhea or abdominal pain.  GENITOURINARY: No dysuria, hematuria.  ENDOCRINE: No polyuria, nocturia,  HEMATOLOGY: No anemia, easy bruising or bleeding SKIN: No rash or lesion. MUSCULOSKELETAL: No joint pain or arthritis.   NEUROLOGIC: No tingling, numbness, weakness.  PSYCHIATRY: No anxiety  or depression.   MEDICATIONS AT HOME:   Prior to Admission medications   Medication Sig Start Date End Date Taking? Authorizing Provider  aspirin EC 81 MG tablet Take 81 mg by mouth.    Historical Provider, MD  diphenhydrAMINE (BENADRYL) 25 mg capsule Take 25 mg by mouth Nightly.    Historical Provider, MD  gabapentin (NEURONTIN) 300 MG capsule 300 mg 3 (three) times daily. 07/16/15   Historical Provider, MD  guaiFENesin-codeine (ROBITUSSIN AC) 100-10 MG/5ML syrup Take 5 mLs by mouth 2 (two) times daily. 09/15/15   Historical Provider, MD  pravastatin (PRAVACHOL) 40 MG tablet 40 mg. 06/09/15   Historical Provider, MD       VITAL SIGNS:  Blood pressure 146/78, pulse 90, temperature 98.7 F (37.1 C), temperature source Oral, resp. rate 16, height 5\' 1"  (1.549 m), weight 59.875 kg (132 lb), SpO2 97 %.  PHYSICAL EXAMINATION:  GENERAL:  75 y.o.-year-old patient lying in the bed with no acute distress.  EYES: Pupils equal, round, reactive to light and accommodation. No scleral icterus. Extraocular muscles intact.  HEENT: Head atraumatic, normocephalic. Oropharynx and nasopharynx clear.  NECK:  Supple, no jugular venous distention. No thyroid enlargement, no tenderness.  LUNGS: Normal breath sounds bilaterally, no wheezing, rales,rhonchi or crepitation. No use of accessory muscles of respiration.  CARDIOVASCULAR: S1, S2 normal. No murmurs, rubs, or gallops.  ABDOMEN: Soft, nontender, nondistended. Bowel sounds present. No organomegaly or mass.  EXTREMITIES: No pedal edema, cyanosis, or clubbing.  NEUROLOGIC: Cranial nerves II through XII are intact. Muscle strength 5/5 in all extremities. Sensation intact. Gait not checked.  PSYCHIATRIC: The patient is alert and oriented x 3.  SKIN: No obvious rash, lesion, or ulcer.   LABORATORY PANEL:   CBC  Recent Labs Lab 01/07/16 1740  WBC 6.4  HGB 13.1  HCT 38.2  PLT 291   ------------------------------------------------------------------------------------------------------------------  Chemistries   Recent Labs Lab 01/07/16 1740  NA 139  K 3.9  CL 102  CO2 30  GLUCOSE 119*  BUN 15  CREATININE 0.70  CALCIUM 9.5  AST 21  ALT 14  ALKPHOS 85  BILITOT 0.3   ------------------------------------------------------------------------------------------------------------------  Cardiac Enzymes No results for input(s): TROPONINI in the last 168 hours. ------------------------------------------------------------------------------------------------------------------  RADIOLOGY:  No results found.  EKG:  No orders found for this or any previous  visit.  IMPRESSION AND PLAN:   1.GI bleed; with black stool;likley hemorrhoidal;no drop in hb here at [facility] labs but outside labs showed  Hb drop from 12.7 to 11.9.pt is convinced that she needed to stay ;because of mismatch of  Hb,will keep her overnight due to her history of  passing black stool since Wednesday.continue PPI>hold fluids and other blood work.    All the records are reviewed and case discussed with ED provider. Management plans discussed with the patient, family and they are in agreement.  CODE STATUS:  full TOTAL TIME TAKING CARE OF THIS PATIENT: 42minutes.    Epifanio Lesches M.D on 01/07/2016 at 6:37 PM  Between 7am to 6pm - Pager - [phone]  After 6pm go to [email or web address] - password [email or web address] [facility]  Office  [phone]  CC: Primary care physician; [name], MD  Note: This dictation was prepared with Dragon dictation along with smaller phrase technology. Any transcriptional errors that result from this process are unintentional.

## 2016-01-07 NOTE — ED Provider Notes (Signed)
Time Seen: Approximately 1715 I have reviewed the triage notes  Chief Complaint: Rectal Bleeding   History of Present Illness: [name] [name] is a 75 y.o. female who has had some bright red blood in her stool mixed in with some dark colored stool over the last several days. Patient's been followed by gastroenterology and was referred here by the PA after they had checked her hemoglobin today"" dropped one point "". Since had a shortness of breath but otherwise has not had any near syncopal symptoms, chest pain. She is not currently on any blood thinners. She states they plan on doing a colonoscopy "" next week". She denies any easy bruising or history with her difficulties with her blood clotting. Review of the laboratory work present systems does not show any recent hemoglobins.    Past Medical History  Diagnosis Date  . History of bladder infections   . History of CVA (cerebrovascular accident)   . Hyperlipidemia   . Trigger finger   . Diverticulosis   . H/O bone density study     05/30/06; 06/04/08; 06/05/12  . History of chicken pox   . Hemorrhoids   . Diverticulosis   . Breast cancer ([place]) 2009    left breast, radiation  . Blood transfusion without reported diagnosis     Patient Active Problem List   Diagnosis Date Noted  . Malignant neoplasm of breast ([place]) 11/04/2015    Past Surgical History  Procedure Laterality Date  . Breast biopsy Left 1986    neg  . Breast biopsy Left 1988    neg  . Breast biopsy Left 1995    neg  . Breast lumpectomy    . Appendectomy  1954  . Laparoscopic hysterectomy    . Tonsillectomy  1954  . Wrist surgery  2009, 2006    ganglion cysts removal  . Esophagogastroduodenoscopy  1/292015  . Colonoscopy  07/31/2013  . Eye lid lift      Past Surgical History  Procedure Laterality Date  . Breast biopsy Left 1986    neg  . Breast biopsy Left 1988    neg  . Breast biopsy Left 1995    neg  . Breast lumpectomy    . Appendectomy  1954   . Laparoscopic hysterectomy    . Tonsillectomy  1954  . Wrist surgery  2009, 2006    ganglion cysts removal  . Esophagogastroduodenoscopy  1/292015  . Colonoscopy  07/31/2013  . Eye lid lift      Current Outpatient Rx  Name  Route  Sig  Dispense  Refill  . aspirin EC 81 MG tablet   Oral   Take 81 mg by mouth.         . diphenhydrAMINE (BENADRYL) 25 mg capsule   Oral   Take 25 mg by mouth Nightly.         . gabapentin (NEURONTIN) 300 MG capsule      300 mg 3 (three) times daily.         Marland Kitchen guaiFENesin-codeine (ROBITUSSIN AC) 100-10 MG/5ML syrup   Oral   Take 5 mLs by mouth 2 (two) times daily.         . pravastatin (PRAVACHOL) 40 MG tablet      40 mg.           Allergies:  Levofloxacin and Other  Family History: Family History  Problem Relation Age of Onset  . Prostate cancer Brother   . Crohn's disease Brother   .  Heart attack Father   . Prostate cancer Father   . Vaginal cancer Maternal Grandmother   . Stroke Mother     Social History: Social History  Substance Use Topics  . Smoking status: Never Smoker   . Smokeless tobacco: Never Used  . Alcohol Use: No     Review of Systems:   10 point review of systems was performed and was otherwise negative:  Constitutional: No fever Eyes: No visual disturbances ENT: No sore throat, ear pain Cardiac: No chest pain Respiratory: No shortness of breath, wheezing, or stridor Abdomen: No abdominal pain, no vomiting, No diarrhea Endocrine: No weight loss, No night sweats Extremities: No peripheral edema, cyanosis Skin: No rashes, easy bruising Neurologic: No focal weakness, trouble with speech or swollowing Urologic: No dysuria, Hematuria, or urinary frequency Patient states she had a rectal exam in the office 2 days ago which showed blood  Physical Exam:  ED Triage Vitals  Enc Vitals Group     BP 01/07/16 1601 146/78 mmHg     Pulse Rate 01/07/16 1601 90     Resp 01/07/16 1601 16     Temp  01/07/16 1601 98.7 F (37.1 C)     Temp Source 01/07/16 1601 Oral     SpO2 01/07/16 1601 97 %     Weight 01/07/16 1601 132 lb (59.875 kg)     Height 01/07/16 1601 5\' 1"  (1.549 m)     Head Cir --      Peak Flow --      Pain Score 01/07/16 1601 0     Pain Loc --      Pain Edu? --      Excl. in [place]? --     General: Awake , Alert , and Oriented times 3; GCS 15 Head: Normal cephalic , atraumatic Eyes: Pupils equal , round, reactive to light Nose/Throat: No nasal drainage, patent upper airway without erythema or exudate.  Neck: Supple, Full range of motion, No anterior adenopathy or palpable thyroid masses Lungs: Clear to ascultation without wheezes , rhonchi, or rales Heart: Regular rate, regular rhythm without murmurs , gallops , or rubs Abdomen: Soft, non tender without rebound, guarding , or rigidity; bowel sounds positive and symmetric in all 4 quadrants. No organomegaly .        Extremities: 2 plus symmetric pulses. No edema, clubbing or cyanosis Neurologic: normal ambulation, Motor symmetric without deficits, sensory intact Skin: warm, dry, no rashes   Labs:   All laboratory work was reviewed including any pertinent negatives or positives listed below:  Labs Reviewed  CBC WITH DIFFERENTIAL/PLATELET  COMPREHENSIVE METABOLIC PANEL  APTT  PROTIME-INR  TYPE AND SCREEN  Laboratory work was reviewed and showed no clinically significant abnormalities.    ED Course:  Patient appears to be hemodynamically stable and the laboratory work is within normal limits. Source of bleeding could be diverticulosis, colon/rectal cancer, AV malformation, etc. Patient's case was reviewed with the hospitalist team, further disposition and management depends upon their evaluation, I felt there was no reason to initiate blood transfusion at this time    Assessment: * Stable gastrointestinal bleed     Plan: *Inpatient observation          Daymon Larsen, MD 01/07/16 [phone]

## 2016-01-08 LAB — CBC
HEMATOCRIT: 33.3 % — AB (ref 35.0–47.0)
HEMOGLOBIN: 11.7 g/dL — AB (ref 12.0–16.0)
MCH: 31.3 pg (ref 26.0–34.0)
MCHC: 35.1 g/dL (ref 32.0–36.0)
MCV: 89.2 fL (ref 80.0–100.0)
Platelets: 248 10*3/uL (ref 150–440)
RBC: 3.73 MIL/uL — AB (ref 3.80–5.20)
RDW: 13.5 % (ref 11.5–14.5)
WBC: 4.9 10*3/uL (ref 3.6–11.0)

## 2016-01-08 LAB — BASIC METABOLIC PANEL
ANION GAP: 6 (ref 5–15)
BUN: 15 mg/dL (ref 6–20)
CALCIUM: 8.8 mg/dL — AB (ref 8.9–10.3)
CHLORIDE: 108 mmol/L (ref 101–111)
CO2: 26 mmol/L (ref 22–32)
Creatinine, Ser: 0.69 mg/dL (ref 0.44–1.00)
Glucose, Bld: 100 mg/dL — ABNORMAL HIGH (ref 65–99)
POTASSIUM: 3.8 mmol/L (ref 3.5–5.1)
Sodium: 140 mmol/L (ref 135–145)

## 2016-01-08 MED ORDER — PRAVASTATIN SODIUM 40 MG PO TABS
40.0000 mg | ORAL_TABLET | Freq: Every day | ORAL | Status: DC
  Administered 2016-01-08 – 2016-01-09 (×2): 40 mg via ORAL
  Filled 2016-01-08 (×2): qty 1

## 2016-01-08 MED ORDER — PRAVASTATIN SODIUM 40 MG PO TABS: 40.0000 mg | ORAL_TABLET | Freq: Every day | ORAL | Status: DC

## 2016-01-08 NOTE — Consult Note (Signed)
GI Inpatient Consult Note  Reason for Consult: Rectal bleeding   Attending Requesting Consult: Dr. Vianne Bulls  History of Present Illness: [name] [name] is a 75 y.o. 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  Patient states she was in her usual state of health experienced approximately 8 episodes of soft/loose black stools with BRB intermixed on Wednesday.  On Thursday morning, she awoke to 3 more stools of the same color and consistency.  The BMs were not painful or difficult to pass.  No palpable hemorrhoids or fissures per patient.  She endorses 1-2 episodes of intermittent epigastric burning per week; this began about 6 months ago.  Alleviating factors include drinking water.  Otherwise, she denies dysphasia, GERD symptoms, nausea/vomiting, unexplained weight loss, or appetite changes.  No significant NSAID use; she may take Advil 400 mg once per week for headache as needed.  Patient's last scopes were in 07/2013.  Her colonoscopy wasnotable for sigmoid diverticulosis.  EGD (to evaluate weight loss) was normal.  Patient denies a family history of CCA, colon polyps, or other GI malignancy.  Patient presented to [facility], PA[facility] on Thursday for evaluation of these complaints.  At that time, rectal exam revealed maroon appearing stool in the rectal vault, which was grossly heme positive on FOBT cards.  Labs revealed Hgb 12.7 with normal HCT, MCV, and BUN.  Patient return on Friday morning for a repeat CBC, which was notable for Hgb 11.9.  Given this drop in hemoglobin as well as continued maroon stools, patient was advised to seek evaluation at the [facility] ED.  The ED, patient continued to endorse intermittent black/maroon stools but denies any anemia symptoms or other GI concerns.  Hgb returned at 13.1.  Patient was admitted for monitoring of serial Hgb, which  returned at 11.7 this morning.  BUN remains WNL.  Patient is currently receiving IV Pepcid 20 mg twice daily for GI prophylaxis.  A GI consultation was requested for further management.  This morning, [name] [name] reports 3 episodes of "very liquid stool", still with a small amount of BRB intermixed.  She is tolerating clear liquids w/o difficulty.  No new GI complaints at this time.   Past Medical History:  Past Medical History  Diagnosis Date  . History of bladder infections   . History of CVA (cerebrovascular accident)   . Hyperlipidemia   . Trigger finger   . Diverticulosis   . H/O bone density study     05/30/06; 06/04/08; 06/05/12  . History of chicken pox   . Hemorrhoids   . Diverticulosis   . Breast cancer ([place]) 2009    left breast, radiation  . Blood transfusion without reported diagnosis     Problem List: Patient Active Problem List   Diagnosis Date Noted  . Rectal bleeding 01/07/2016  . Malignant neoplasm of breast ([place]) 11/04/2015    Past Surgical History: Past Surgical History  Procedure Laterality Date  . Breast biopsy Left 1986    neg  . Breast biopsy Left 1988    neg  . Breast biopsy Left 1995    neg  . Breast lumpectomy    . Appendectomy  1954  . Laparoscopic hysterectomy    . Tonsillectomy  1954  . Wrist surgery  2009, 2006    ganglion cysts removal  . Esophagogastroduodenoscopy  1/292015  . Colonoscopy  07/31/2013  . Eye lid lift  Allergies: Allergies  Allergen Reactions  . Levofloxacin Other (See Comments)    Causes muscle problems.  . Demerol [Meperidine] Nausea Only  . Other Nausea And Vomiting  . Sulfa Antibiotics Nausea Only    Home Medications: Prescriptions prior to admission  Medication Sig Dispense Refill Last Dose  . diphenhydrAMINE (BENADRYL) 25 mg capsule Take 25 mg by mouth at bedtime.    01/06/2016 at Unknown time  . gabapentin (NEURONTIN) 300 MG capsule 300 mg 2 (two) times daily.    01/07/2016 at Unknown time  . pantoprazole  (PROTONIX) 40 MG tablet Take 40 mg by mouth 2 (two) times daily.   01/07/2016 at 1300  . pravastatin (PRAVACHOL) 40 MG tablet Take 40 mg by mouth at bedtime.    01/06/2016 at Unknown time  . aspirin EC 81 MG tablet Take 81 mg by mouth.   01/06/2016   Home medication reconciliation was completed with the patient.   Scheduled Inpatient Medications:   . famotidine (PEPCID) IV  20 mg Intravenous Q12H  . gabapentin  300 mg Oral TID  . pravastatin  40 mg Oral QHS    Continuous Inpatient Infusions:     PRN Inpatient Medications:  acetaminophen **OR** acetaminophen, bisacodyl, diphenhydrAMINE, HYDROcodone-acetaminophen, ondansetron **OR** ondansetron (ZOFRAN) IV, traZODone  Family History: family history includes Crohn's disease in her brother; Heart attack in her father; Prostate cancer in her brother and father; Stroke in her mother; Vaginal cancer in her maternal grandmother.    Social History:   reports that she has never smoked. She has never used smokeless tobacco. She reports that she does not drink alcohol or use illicit drugs.   Review of Systems: Constitutional: Weight is stable.  Eyes: No changes in vision. ENT: No oral lesions, sore throat.  GI: see HPI.  Heme/Lymph: No easy bruising.  CV: No chest pain.  GU: No hematuria.  Integumentary: No rashes.  Neuro: No headaches.  Psych: No depression/anxiety.  Endocrine: No heat/cold intolerance.  Allergic/Immunologic: No urticaria.  Resp: No cough, SOB.  Musculoskeletal: No joint swelling.    Physical Examination: BP 131/67 mmHg  Pulse 79  Temp(Src) 97.8 F (36.6 C) (Oral)  Resp 20  Ht 5\' 1"  (1.549 m)  Wt 62.415 kg (137 lb 9.6 oz)  BMI 26.01 kg/m2  SpO2 97% Gen: NAD, alert and oriented x 4 HEENT: PEERLA, EOMI, Neck: supple, no JVD or thyromegaly Chest: CTA bilaterally, no wheezes, crackles, or other adventitious sounds CV: RRR, no m/g/c/r Abd: soft, NT, ND, +BS in all four quadrants; no HSM, guarding, ridigity, or  rebound tenderness Ext: no edema, well perfused with 2+ pulses, Skin: no rash or lesions noted Lymph: no LAD  Data: Lab Results  Component Value Date   WBC 4.9 01/08/2016   HGB 11.7* 01/08/2016   HCT 33.3* 01/08/2016   MCV 89.2 01/08/2016   PLT 248 01/08/2016    Recent Labs Lab 01/07/16 1740 01/08/16 0337  HGB 13.1 11.7*   Lab Results  Component Value Date   NA 140 01/08/2016   K 3.8 01/08/2016   CL 108 01/08/2016   CO2 26 01/08/2016   BUN 15 01/08/2016   CREATININE 0.69 01/08/2016   Lab Results  Component Value Date   ALT 14 01/07/2016   AST 21 01/07/2016   ALKPHOS 85 01/07/2016   BILITOT 0.3 01/07/2016    Recent Labs Lab 01/07/16 1740  APTT 34  INR 1.11   Assessment/Plan: Ms. Schrauben is a 75 y.o. female with a known history of  CVA (not on anticoagulation), diverticulosis, hemorrhoids, breast cancer (A/P radiation), and HLD admitted with rectal bleeding.  Patient presented to [facility] on Thursday for evaluation after several episodes of black stools with BRB intermixed.  HCV was initially 12.7, decreased to 11.9 on Friday.  ED evaluation was recommended given this drop in Hgb and continued her in stools.  Since admission, Hgb has fluctuated between 13.1 and 11.7.  In March, patient's Hgb was 14.2.  Patient presently denies GI concerns.  Patient had a BM just prior to my visit, which consisted of dark-colored liquid with a small amount of BRB intermixed.  I discussed this patient with Dr. Vira Agar, and a colonoscopy will be considered pending her progress through the rest of the day.  Please see his note for further recommendations.  Recommendations: - Considering colonoscopy - Dr. Vira Agar to determine pending patient's progress - Monitor Hgb, transfuse if <7 - Continue clear liquids, PPI - Will continue to monitor  Thank you for the consult. We will follow along with you. Please call with questions or concerns.  Lavera Guise, PA-C [facility]  Gastroenterology Phone: [phone] Pager: [phone]

## 2016-01-08 NOTE — Progress Notes (Signed)
[place] at [place] NAME: [name] [name]    MR#:  [id number]  DATE OF BIRTH:  Apr 10, 1941  SUBJECTIVE:   Patient with dark colored stools no abdominal pain Not taking NSAIDs.  REVIEW OF SYSTEMS:    Review of Systems  Constitutional: Negative for fever, chills and malaise/fatigue.  HENT: Negative for ear discharge, ear pain, hearing loss, nosebleeds and sore throat.   Eyes: Negative for blurred vision and pain.  Respiratory: Negative for cough, hemoptysis, shortness of breath and wheezing.   Cardiovascular: Negative for chest pain, palpitations and leg swelling.  Gastrointestinal: Positive for melena. Negative for nausea, vomiting, abdominal pain, diarrhea and blood in stool.  Genitourinary: Negative for dysuria.  Musculoskeletal: Negative for back pain.  Neurological: Negative for dizziness, tremors, speech change, focal weakness, seizures and headaches.  Endo/Heme/Allergies: Does not bruise/bleed easily.  Psychiatric/Behavioral: Negative for depression, suicidal ideas and hallucinations.    Tolerating Diet:Clear liquid diet      DRUG ALLERGIES:   Allergies  Allergen Reactions  . Levofloxacin Other (See Comments)    Causes muscle problems.  . Demerol [Meperidine] Nausea Only  . Other Nausea And Vomiting  . Sulfa Antibiotics Nausea Only    VITALS:  Blood pressure 131/67, pulse 79, temperature 97.8 F (36.6 C), temperature source Oral, resp. rate 20, height 5\' 1"  (1.549 m), weight 62.415 kg (137 lb 9.6 oz), SpO2 97 %.  PHYSICAL EXAMINATION:   Physical Exam    LABORATORY PANEL:   CBC  Recent Labs Lab 01/08/16 0337  WBC 4.9  HGB 11.7*  HCT 33.3*  PLT 248   ------------------------------------------------------------------------------------------------------------------  Chemistries   Recent Labs Lab 01/07/16 1740 01/08/16 0337  NA 139 140  K 3.9 3.8  CL 102 108  CO2 30 26  GLUCOSE 119* 100*  BUN 15 15   CREATININE 0.70 0.69  CALCIUM 9.5 8.8*  AST 21  --   ALT 14  --   ALKPHOS 85  --   BILITOT 0.3  --    ------------------------------------------------------------------------------------------------------------------  Cardiac Enzymes No results for input(s): TROPONINI in the last 168 hours. ------------------------------------------------------------------------------------------------------------------  RADIOLOGY:  No results found.   ASSESSMENT AND PLAN:   75 year old female who presents with melena.   1. GI bleed with melena: Hemoglobin relatively stable. GI consult for further evaluation. Continue PPI  2. Hyperlipidemia: Continue statin.     Management plans discussed with the patient and she is in agreement.  CODE STATUS: full  TOTAL TIME TAKING CARE OF THIS PATIENT: 30 minutes.     POSSIBLE D/C tomorrow, DEPENDING ON CLINICAL CONDITION.   Jeanean Hollett M.D on 01/08/2016 at 10:22 AM  Between 7am to 6pm - Pager - [phone] After 6pm go to [email or web address] - password [email or web address] [place] [facility]  Office  [phone]  CC: Primary care physician; [name], MD  Note: This dictation was prepared with Dragon dictation along with smaller phrase technology. Any transcriptional errors that result from this process are unintentional.

## 2016-01-08 NOTE — Care Management Obs Status (Signed)
MEDICARE OBSERVATION STATUS NOTIFICATION   Patient Details  Name: [name] [name] MRN: [id number] Date of Birth: 02-Dec-1940   Medicare Observation Status Notification Given:  Yes    Ival Bible, RN 01/08/2016, 7:07 PM

## 2016-01-08 NOTE — Consult Note (Signed)
Patient with blackish stools for a few days with some BRB on or mixed with it.  Hx of diverticulosis 07/2013.  No epigastric abd pain, no NSAID use for a while. Will do EGD tomorrow and if neg do colonoscopy Monday.

## 2016-01-09 ENCOUNTER — Observation Stay: Payer: Commercial Managed Care - HMO | Admitting: Registered Nurse

## 2016-01-09 ENCOUNTER — Encounter: Payer: Self-pay | Admitting: *Deleted

## 2016-01-09 ENCOUNTER — Encounter
Admission: EM | Disposition: A | Discharge status: Home / Self Care | Payer: Self-pay | Source: Home / Self Care | Attending: Emergency Medicine

## 2016-01-09 HISTORY — PX: ESOPHAGOGASTRODUODENOSCOPY: SHX5428

## 2016-01-09 LAB — CBC
HCT: 37.6 % (ref 35.0–47.0)
Hemoglobin: 13.4 g/dL (ref 12.0–16.0)
MCH: 31.1 pg (ref 26.0–34.0)
MCHC: 35.5 g/dL (ref 32.0–36.0)
MCV: 87.6 fL (ref 80.0–100.0)
PLATELETS: 280 10*3/uL (ref 150–440)
RBC: 4.29 MIL/uL (ref 3.80–5.20)
RDW: 13.4 % (ref 11.5–14.5)
WBC: 5 10*3/uL (ref 3.6–11.0)

## 2016-01-09 LAB — GLUCOSE, CAPILLARY: Glucose-Capillary: 91 mg/dL (ref 65–99)

## 2016-01-09 SURGERY — EGD (ESOPHAGOGASTRODUODENOSCOPY)
Anesthesia: General

## 2016-01-09 MED ORDER — PEG 3350-KCL-NA BICARB-NACL 420 G PO SOLR
4000.0000 mL | Freq: Once | ORAL | Status: AC
  Administered 2016-01-09: 4000 mL via ORAL
  Filled 2016-01-09: qty 4000

## 2016-01-09 MED ORDER — FENTANYL CITRATE (PF) 100 MCG/2ML IJ SOLN
INTRAMUSCULAR | Status: DC | PRN
  Administered 2016-01-09: 50 ug via INTRAVENOUS

## 2016-01-09 MED ORDER — FENTANYL CITRATE (PF) 100 MCG/2ML IJ SOLN
25.0000 ug | INTRAMUSCULAR | Status: DC | PRN
  Administered 2016-01-10: 50 ug via INTRAVENOUS
  Filled 2016-01-09 (×2): qty 0.5

## 2016-01-09 MED ORDER — MIDAZOLAM HCL 2 MG/2ML IJ SOLN
INTRAMUSCULAR | Status: DC | PRN
  Administered 2016-01-09: 1 mg via INTRAVENOUS

## 2016-01-09 MED ORDER — LACTATED RINGERS IV SOLN
INTRAVENOUS | Status: DC
  Administered 2016-01-09: 08:00:00 via INTRAVENOUS
  Administered 2016-01-09: 1000 mL via INTRAVENOUS

## 2016-01-09 MED ORDER — ONDANSETRON HCL 4 MG/2ML IJ SOLN: 4.0000 mg | Freq: Once | INTRAMUSCULAR | Status: DC | PRN

## 2016-01-09 MED ORDER — SODIUM CHLORIDE 0.9 % IV SOLN: INTRAVENOUS | Status: DC

## 2016-01-09 MED ORDER — PROPOFOL 10 MG/ML IV BOLUS
INTRAVENOUS | Status: DC | PRN
  Administered 2016-01-09: 50 mg via INTRAVENOUS

## 2016-01-09 MED ORDER — PROPOFOL 500 MG/50ML IV EMUL
INTRAVENOUS | Status: DC | PRN
  Administered 2016-01-09: 180 ug/kg/min via INTRAVENOUS

## 2016-01-09 NOTE — Consult Note (Signed)
Patient EGD showed minimal gastritis in body of stomach, doubt source of melena.  Will do colonoscopy tomorrow morning after 4L [place] prep today.

## 2016-01-09 NOTE — Op Note (Signed)
[facility] Patient Name: [name] [name] Procedure Date: 01/09/2016 8:09 AM MRN: [id number] Account #: [id number] Date of Birth: 04/02/1941 Admit Type: Inpatient Age: 75 Room: [facility] ENDO ROOM 4 Gender: Female Note Status: Finalized Procedure:            Upper GI endoscopy Indications:          Melena Providers:            Manya Silvas, MD Referring MD:         Hewitt Blade. Sarina Ser, MD (Referring MD) Medicines:            Propofol per Anesthesia Complications:        No immediate complications. Procedure:            Pre-Anesthesia Assessment:                       - After reviewing the risks and benefits, the patient                        was deemed in satisfactory condition to undergo the                        procedure.                       After obtaining informed consent, the endoscope was                        passed under direct vision. Throughout the procedure,                        the patient's blood pressure, pulse, and oxygen                        saturations were monitored continuously. The Endoscope                        was introduced through the mouth, and advanced to the                        second part of duodenum. The upper GI endoscopy was                        accomplished without difficulty. The patient tolerated                        the procedure well. Findings:      The examined esophagus was normal. GEJ 40cm.      Patchy minimal inflammation characterized by erythema and granularity       was found in the gastric body. And cardia. Streaks of minimal coarse       erythema.      The examined duodenum was normal. Impression:           - Normal esophagus.                       - Gastritis.                       - Normal examined duodenum.                       -  No specimens collected. Recommendation:       - The findings and recommendations were discussed with                        the patient. Plan  colonoscopy for tomorrow. Manya Silvas, MD 01/09/2016 8:21:38 AM This report has been signed electronically. Number of Addenda: 0 Note Initiated On: 01/09/2016 8:09 AM      [facility]

## 2016-01-09 NOTE — Anesthesia Preprocedure Evaluation (Addendum)
Anesthesia Evaluation  Patient identified by MRN, date of birth, ID band Patient awake    Reviewed: Allergy & Precautions, NPO status , Patient's Chart, lab work & pertinent test results  Airway Mallampati: II  TM Distance: >3 FB     Dental  (+) Caps   Pulmonary neg pulmonary ROS,    Pulmonary exam normal        Cardiovascular negative cardio ROS Normal cardiovascular exam     Neuro/Psych CVA, No Residual Symptoms negative psych ROS   GI/Hepatic Neg liver ROS, GERD  Medicated and Controlled,Rectal bleeding diverticulosis   Endo/Other  negative endocrine ROS  Renal/GU negative Renal ROS     Musculoskeletal   Abdominal Normal abdominal exam  (+)   Peds  Hematology negative hematology ROS (+)   Anesthesia Other Findings Rectal bleeding Hx of colonoscopy and endoscopy Breast neoplasm  Reproductive/Obstetrics                             Anesthesia Physical Anesthesia Plan  ASA: III and emergent  Anesthesia Plan: General   Post-op Pain Management:    Induction: Intravenous  Airway Management Planned: Nasal Cannula  Additional Equipment:   Intra-op Plan:   Post-operative Plan:   Informed Consent: I have reviewed the patients History and Physical, chart, labs and discussed the procedure including the risks, benefits and alternatives for the proposed anesthesia with the patient or authorized representative who has indicated his/her understanding and acceptance.   Dental advisory given  Plan Discussed with: CRNA and Surgeon  Anesthesia Plan Comments:        Anesthesia Quick Evaluation

## 2016-01-09 NOTE — Anesthesia Procedure Notes (Signed)
Date/Time: 01/09/2016 8:06 AM Performed by: Doreen Salvage Pre-anesthesia Checklist: Patient identified, Emergency Drugs available, Suction available and Patient being monitored Patient Re-evaluated:Patient Re-evaluated prior to inductionOxygen Delivery Method: Nasal cannula Intubation Type: IV induction Dental Injury: Teeth and Oropharynx as per pre-operative assessment  Comments: Nasal cannula with etCO2 monitoring

## 2016-01-09 NOTE — Transfer of Care (Signed)
Immediate Anesthesia Transfer of Care Note  Patient: [name] [name]  Procedure(s) Performed: Procedure(s): ESOPHAGOGASTRODUODENOSCOPY (EGD) (N/A)  Patient Location: PACU  Anesthesia Type:General  Level of Consciousness: sedated  Airway & Oxygen Therapy: Patient Spontanous Breathing and Patient connected to face mask oxygen  Post-op Assessment: Report given to RN and Post -op Vital signs reviewed and stable  Post vital signs: Reviewed and stable  Last Vitals:  Filed Vitals:   01/09/16 0745 01/09/16 0825  BP: 136/73 105/71  Pulse: 79 73  Temp: 36.9 C 35.6 C  Resp: 18 12    Complications: No apparent anesthesia complications

## 2016-01-09 NOTE — Progress Notes (Signed)
[place] at [place] NAME: [name] [name]    MR#:  [id number]  DATE OF BIRTH:  1940-09-05  SUBJECTIVE:   Patient Had 2 dark tar colored stool yesterday  REVIEW OF SYSTEMS:    Review of Systems  Constitutional: Negative for fever, chills and malaise/fatigue.  HENT: Negative for ear discharge, ear pain, hearing loss, nosebleeds and sore throat.   Eyes: Negative for blurred vision and pain.  Respiratory: Negative for cough, hemoptysis, shortness of breath and wheezing.   Cardiovascular: Negative for chest pain, palpitations and leg swelling.  Gastrointestinal: Positive for melena. Negative for nausea, vomiting, abdominal pain, diarrhea and blood in stool.  Genitourinary: Negative for dysuria.  Musculoskeletal: Negative for back pain.  Neurological: Negative for dizziness, tremors, speech change, focal weakness, seizures and headaches.  Endo/Heme/Allergies: Does not bruise/bleed easily.  Psychiatric/Behavioral: Negative for depression, suicidal ideas and hallucinations.    Tolerating Diet: Nothing by mouth     DRUG ALLERGIES:   Allergies  Allergen Reactions  . Levofloxacin Other (See Comments)    Causes muscle problems.  . Demerol [Meperidine] Nausea Only  . Other Nausea And Vomiting  . Sulfa Antibiotics Nausea Only    VITALS:  Blood pressure 128/65, pulse 69, temperature 97.9 F (36.6 C), temperature source Oral, resp. rate 16, height 5\' 1"  (1.549 m), weight 62.279 kg (137 lb 4.8 oz), SpO2 95 %.  PHYSICAL EXAMINATION:   Physical Exam    LABORATORY PANEL:   CBC  Recent Labs Lab 01/09/16 0714  WBC 5.0  HGB 13.4  HCT 37.6  PLT 280   ------------------------------------------------------------------------------------------------------------------  Chemistries   Recent Labs Lab 01/07/16 1740 01/08/16 0337  NA 139 140  K 3.9 3.8  CL 102 108  CO2 30 26  GLUCOSE 119* 100*  BUN 15 15  CREATININE 0.70 0.69   CALCIUM 9.5 8.8*  AST 21  --   ALT 14  --   ALKPHOS 85  --   BILITOT 0.3  --    ------------------------------------------------------------------------------------------------------------------  Cardiac Enzymes No results for input(s): TROPONINI in the last 168 hours. ------------------------------------------------------------------------------------------------------------------  RADIOLOGY:  No results found.   ASSESSMENT AND PLAN:   75 year old female who presents with melena.   1. GI bleed with melena: Hemoglobin Remained stable. She underwent EGD which showed no etiology of GI bleed. Plan for colonoscopy tomorrow.   2. Hyperlipidemia: Continue statin.     Management plans discussed with the patient and she is in agreement.  CODE STATUS: full  TOTAL TIME TAKING CARE OF THIS PATIENT: 25 minutes.   Discussed with Dr. Vira Agar  POSSIBLE D/C tomorrow, DEPENDING ON CLINICAL CONDITION.   Shi Blankenship M.D on 01/09/2016 at 9:28 AM  Between 7am to 6pm - Pager - [phone] After 6pm go to [email or web address] - password [email or web address] [place] [facility]  Office  [phone]  CC: Primary care physician; [name] Brunner, MD  Note: This dictation was prepared with Dragon dictation along with smaller phrase technology. Any transcriptional errors that result from this process are unintentional.

## 2016-01-10 ENCOUNTER — Observation Stay: Payer: Commercial Managed Care - HMO | Admitting: Anesthesiology

## 2016-01-10 ENCOUNTER — Encounter
Admission: EM | Disposition: A | Discharge status: Home / Self Care | Payer: Self-pay | Source: Home / Self Care | Attending: Emergency Medicine

## 2016-01-10 ENCOUNTER — Encounter: Payer: Self-pay | Admitting: *Deleted

## 2016-01-10 HISTORY — PX: COLONOSCOPY WITH PROPOFOL: SHX5780

## 2016-01-10 LAB — CBC
HCT: 35.6 % (ref 35.0–47.0)
HEMOGLOBIN: 12.5 g/dL (ref 12.0–16.0)
MCH: 31.1 pg (ref 26.0–34.0)
MCHC: 35.2 g/dL (ref 32.0–36.0)
MCV: 88.3 fL (ref 80.0–100.0)
Platelets: 269 10*3/uL (ref 150–440)
RBC: 4.04 MIL/uL (ref 3.80–5.20)
RDW: 13.4 % (ref 11.5–14.5)
WBC: 4.6 10*3/uL (ref 3.6–11.0)

## 2016-01-10 LAB — GLUCOSE, CAPILLARY: GLUCOSE-CAPILLARY: 89 mg/dL (ref 65–99)

## 2016-01-10 SURGERY — COLONOSCOPY WITH PROPOFOL
Anesthesia: General

## 2016-01-10 MED ORDER — PROPOFOL 500 MG/50ML IV EMUL
INTRAVENOUS | Status: DC | PRN
  Administered 2016-01-10: 50 ug/kg/min via INTRAVENOUS

## 2016-01-10 MED ORDER — MIDAZOLAM HCL 5 MG/5ML IJ SOLN
INTRAMUSCULAR | Status: DC | PRN
  Administered 2016-01-10: 1 mg via INTRAVENOUS

## 2016-01-10 MED ORDER — LIDOCAINE HCL (PF) 2 % IJ SOLN
INTRAMUSCULAR | Status: DC | PRN
  Administered 2016-01-10: 50 mg

## 2016-01-10 MED ORDER — SODIUM CHLORIDE 0.9 % IV SOLN
INTRAVENOUS | Status: DC
Start: 2016-01-10 — End: 2016-01-10
  Administered 2016-01-10: 09:00:00 via INTRAVENOUS

## 2016-01-10 MED ORDER — PROPOFOL 10 MG/ML IV BOLUS
INTRAVENOUS | Status: DC | PRN
  Administered 2016-01-10: 10 mg via INTRAVENOUS
  Administered 2016-01-10: 20 mg via INTRAVENOUS

## 2016-01-10 MED ORDER — PANTOPRAZOLE SODIUM 40 MG PO TBEC: 40.0000 mg | DELAYED_RELEASE_TABLET | Freq: Every day | ORAL | Status: DC

## 2016-01-10 NOTE — Anesthesia Preprocedure Evaluation (Signed)
Anesthesia Evaluation  Patient identified by MRN, date of birth, ID band Patient awake    Reviewed: Allergy & Precautions, H&P , NPO status , Patient's Chart, lab work & pertinent test results, reviewed documented beta blocker date and time   Airway Mallampati: II   Neck ROM: full    Dental  (+) Poor Dentition   Pulmonary neg pulmonary ROS,    Pulmonary exam normal        Cardiovascular negative cardio ROS Normal cardiovascular exam     Neuro/Psych negative neurological ROS  negative psych ROS   GI/Hepatic negative GI ROS, Neg liver ROS,   Endo/Other  negative endocrine ROS  Renal/GU negative Renal ROS  negative genitourinary   Musculoskeletal   Abdominal   Peds  Hematology negative hematology ROS (+)   Anesthesia Other Findings Past Medical History:   History of bladder infections                                History of CVA (cerebrovascular accident)                    Hyperlipidemia                                               Trigger finger                                               Diverticulosis                                               H/O bone density study                                         Comment:05/30/06; 06/04/08; 06/05/12   History of chicken pox                                       Hemorrhoids                                                  Diverticulosis                                               Blood transfusion without reported diagnosis                 Breast cancer ([place])                             2009           Comment:left breast,  radiation Past Surgical History:   BREAST BIOPSY                                   Left 1986           Comment:neg   BREAST BIOPSY                                   Left 1988           Comment:neg   BREAST BIOPSY                                   Left 1995           Comment:neg   BREAST LUMPECTOMY                                              [place]         WRIST SURGERY                                    2009, 2006     Comment:ganglion cysts removal   ESOPHAGOGASTRODUODENOSCOPY                       1/292015     COLONOSCOPY                                      07/31/2013    eye lid lift                                                BMI    Body Mass Index   25.31 kg/m 2     Reproductive/Obstetrics                             Anesthesia Physical Anesthesia Plan  ASA: III  Anesthesia Plan: General   Post-op Pain Management:    Induction:   Airway Management Planned:   Additional Equipment:   Intra-op Plan:   Post-operative Plan:   Informed Consent: I have reviewed the patients History and Physical, chart, labs and  discussed the procedure including the risks, benefits and alternatives for the proposed anesthesia with the patient or authorized representative who has indicated his/her understanding and acceptance.   Dental Advisory Given  Plan Discussed with: CRNA  Anesthesia Plan Comments:         Anesthesia Quick Evaluation

## 2016-01-10 NOTE — Discharge Summary (Signed)
[name] [place] NAME: [name] [name]    MR#:  [id number]  DATE OF BIRTH:  March 05, 1941  DATE OF ADMISSION:  01/07/2016 ADMITTING PHYSICIAN: Epifanio Lesches, MD  DATE OF DISCHARGE: 01/10/2016  PRIMARY CARE PHYSICIAN: [name], MD    ADMISSION DIAGNOSIS:  Lower GI bleed [K92.2]  DISCHARGE DIAGNOSIS:  Active Problems:   Rectal bleeding   SECONDARY DIAGNOSIS:   Past Medical History  Diagnosis Date  . History of bladder infections   . History of CVA (cerebrovascular accident)   . Hyperlipidemia   . Trigger finger   . Diverticulosis   . H/O bone density study     05/30/06; 06/04/08; 06/05/12  . History of chicken pox   . Hemorrhoids   . Diverticulosis   . Blood transfusion without reported diagnosis   . Breast cancer [facility]) 2009    left breast, radiation    HOSPITAL COURSE:   75 year old female who presents with melena.   1. GI bleed with melena: Hemoglobin Remains stable. She underwent EGD and colonoscopy Both of which did not reveal the source of GI bleed. We are suspecting patient may have had a diverticular bleed. If she continues to have this in the future she will most likely benefit from capsule endoscopy. She required no blood transfusions while in the hospital.  2. Hyperlipidemia: Continue statin.    DISCHARGE CONDITIONS AND DIET:   Stable  Regular diet  CONSULTS OBTAINED:  Treatment Team:  Bettey Costa, MD Manya Silvas, MD  DRUG ALLERGIES:   Allergies  Allergen Reactions  . Levofloxacin Other (See Comments)    Causes muscle problems.  . Demerol [Meperidine] Nausea Only  . Other Nausea And Vomiting  . Sulfa Antibiotics Nausea Only    DISCHARGE MEDICATIONS:   Current Discharge Medication List    CONTINUE these medications which have CHANGED   Details  pantoprazole (PROTONIX) 40 MG tablet Take 1 tablet (40 mg total) by mouth daily. Qty: 30 tablet, Refills: 0      CONTINUE these  medications which have NOT CHANGED   Details  diphenhydrAMINE (BENADRYL) 25 mg capsule Take 25 mg by mouth at bedtime.    Associated Diagnoses: Malignant neoplasm of left female breast, unspecified site of breast (HCC)    gabapentin (NEURONTIN) 300 MG capsule 300 mg 2 (two) times daily.    Associated Diagnoses: Malignant neoplasm of left female breast, unspecified site of breast (HCC)    pravastatin (PRAVACHOL) 40 MG tablet Take 40 mg by mouth at bedtime.    Associated Diagnoses: Malignant neoplasm of left female breast, unspecified site of breast (HCC)    aspirin EC 81 MG tablet Take 81 mg by mouth.   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   VITAL SIGNS:  Blood pressure 124/67, pulse 79, temperature 97.6 F (36.4 C), temperature source Oral, resp. rate 18, height 5\' 1"  (1.549 m), weight 60.737 kg (133 lb 14.4 oz), SpO2 95 %.   REVIEW OF SYSTEMS:  Review of Systems  Constitutional: Negative for fever, chills and malaise/fatigue.  HENT: Negative for ear discharge, ear pain, hearing loss, nosebleeds and sore throat.   Eyes: Negative for blurred vision and pain.  Respiratory: Negative for cough, hemoptysis, shortness of breath and wheezing.   Cardiovascular: Negative for  chest pain, palpitations and leg swelling.  Gastrointestinal: Negative for nausea, vomiting, abdominal pain, diarrhea and blood in stool.  Genitourinary: Negative for dysuria.  Musculoskeletal: Negative for back pain.  Neurological: Negative for dizziness, tremors, speech change, focal weakness, seizures and headaches.  Endo/Heme/Allergies: Does not bruise/bleed easily.  Psychiatric/Behavioral: Negative for depression, suicidal ideas and hallucinations.     PHYSICAL EXAMINATION:  GENERAL:  75 y.o.-year-old patient lying in the bed with no acute distress.  NECK:   Supple, no jugular venous distention. No thyroid enlargement, no tenderness.  LUNGS: Normal breath sounds bilaterally, no wheezing, rales,rhonchi  No use of accessory muscles of respiration.  CARDIOVASCULAR: S1, S2 normal. No murmurs, rubs, or gallops.  ABDOMEN: Soft, non-tender, non-distended. Bowel sounds present. No organomegaly or mass.  EXTREMITIES: No pedal edema, cyanosis, or clubbing.  PSYCHIATRIC: The patient is alert and oriented x 3.  SKIN: No obvious rash, lesion, or ulcer.   DATA REVIEW:   CBC  Recent Labs Lab 01/10/16 0420  WBC 4.6  HGB 12.5  HCT 35.6  PLT 269    Chemistries   Recent Labs Lab 01/07/16 1740 01/08/16 0337  NA 139 140  K 3.9 3.8  CL 102 108  CO2 30 26  GLUCOSE 119* 100*  BUN 15 15  CREATININE 0.70 0.69  CALCIUM 9.5 8.8*  AST 21  --   ALT 14  --   ALKPHOS 85  --   BILITOT 0.3  --     Cardiac Enzymes No results for input(s): TROPONINI in the last 168 hours.  Microbiology Results  @[email or web address] @  RADIOLOGY:  No results found.    Management plans discussed with the patient and she is in agreement. Stable for discharge home  Patient should follow up with PCP in 7-14 days  CODE STATUS:     Code Status Orders        Start     Ordered   01/07/16 1827  Full code   Continuous     01/07/16 1827    Code Status History    Date Active Date Inactive Code Status Order ID Comments User Context   This patient has a current code status but no historical code status.      TOTAL TIME TAKING CARE OF THIS PATIENT: 35 minutes.    Note: This dictation was prepared with Dragon dictation along with smaller phrase technology. Any transcriptional errors that result from this process are unintentional.  Peg Fifer M.D on 01/10/2016 at 10:54 AM  Between 7am to 6pm - Pager - [phone] After 6pm go to [email or web address] - password [email or web address] [facility]  Office  [phone]  CC: Primary care physician; [name], MD

## 2016-01-10 NOTE — Op Note (Signed)
[facility]enterology Patient Name: [name] [name] Procedure Date: 01/10/2016 9:21 AM MRN: [id number] Account #: [id number] Date of Birth: 1941-02-18 Admit Type: Inpatient Age: 75 Room: [facility] ENDO ROOM 4 Gender: Female Note Status: Finalized Procedure:            Colonoscopy Indications:          Melena Providers:            Manya Silvas, MD Referring MD:         Hewitt Blade. Sarina Ser, MD (Referring MD) Medicines:            Propofol per Anesthesia Complications:        No immediate complications. Procedure:            Pre-Anesthesia Assessment:                       - After reviewing the risks and benefits, the patient                        was deemed in satisfactory condition to undergo the                        procedure.                       After obtaining informed consent, the colonoscope was                        passed under direct vision. Throughout the procedure,                        the patient's blood pressure, pulse, and oxygen                        saturations were monitored continuously. The                        Colonoscope was introduced through the anus and                        advanced to the the cecum, identified by appendiceal                        orifice and ileocecal valve. The colonoscopy was                        performed without difficulty. The patient tolerated the                        procedure well. The quality of the bowel preparation                        was good. Findings:      Multiple small and large-mouthed diverticula were found in the sigmoid       colon and descending colon.      Internal hemorrhoids were found during endoscopy. The hemorrhoids were       small and Grade I (internal hemorrhoids that do not prolapse).      The exam was otherwise without abnormality. Impression:           - Diverticulosis in the sigmoid colon and  in the                        descending colon.                       -  Internal hemorrhoids.                       - The examination was otherwise normal.                       - No specimens collected. Recommendation:       - The findings and recommendations were discussed with                        the patient. OK to discharge home. Manya Silvas, MD 01/10/2016 9:47:13 AM This report has been signed electronically. Number of Addenda: 0 Note Initiated On: 01/10/2016 9:21 AM Scope Withdrawal Time: 0 hours 9 minutes 33 seconds  Total Procedure Duration: 0 hours 17 minutes 57 seconds       [facility]

## 2016-01-10 NOTE — Progress Notes (Signed)
Alert and oriented. Vital signs stable . No signs of acute distress. Discharge instructions given. Patient verbalizes understanding. No other issues noted at this time. Patient will call regular Dr's office for follow up appointment.

## 2016-01-10 NOTE — Progress Notes (Addendum)
Pt. Non compliant with voiding in the hat and only informed us of one stool that we could check if the GoLytely worked. That stool was watery and light brown

## 2016-01-10 NOTE — Anesthesia Postprocedure Evaluation (Signed)
Anesthesia Post Note  Patient: TOMA SALZANO  Procedure(s) Performed: Procedure(s) (LRB): COLONOSCOPY WITH PROPOFOL (N/A)  Patient location during evaluation: PACU Anesthesia Type: General Level of consciousness: awake and alert Pain management: pain level controlled Vital Signs Assessment: post-procedure vital signs reviewed and stable Respiratory status: spontaneous breathing, nonlabored ventilation, respiratory function stable and patient connected to nasal cannula oxygen Cardiovascular status: blood pressure returned to baseline and stable Postop Assessment: no signs of nausea or vomiting Anesthetic complications: no    Last Vitals:  Filed Vitals:   01/10/16 0947 01/10/16 1049  BP: 117/72 124/67  Pulse: 77 79  Temp: 36.3 C 36.4 C  Resp: 14 18    Last Pain:  Filed Vitals:   01/10/16 1049  PainSc: 0-No pain                 Molli Barrows

## 2016-01-10 NOTE — Transfer of Care (Signed)
Immediate Anesthesia Transfer of Care Note  Patient: [name] [name]  Procedure(s) Performed: Procedure(s): COLONOSCOPY WITH PROPOFOL (N/A)  Patient Location: PACU  Anesthesia Type:General  Level of Consciousness: sedated  Airway & Oxygen Therapy: Patient Spontanous Breathing and Patient connected to nasal cannula oxygen  Post-op Assessment: Report given to RN and Post -op Vital signs reviewed and stable  Post vital signs: Reviewed and stable  Last Vitals:  Filed Vitals:   01/10/16 0902 01/10/16 0947  BP: 144/70 117/72  Pulse: 76 77  Temp: 35.9 C 36.3 C  Resp: 15 14    Last Pain:  Filed Vitals:   01/10/16 0949  PainSc: 0-No pain      Patients Stated Pain Goal: 0 ([id number])  Complications: No apparent anesthesia complications

## 2016-01-11 ENCOUNTER — Encounter: Payer: Self-pay | Admitting: Unknown Physician Specialty

## 2016-01-11 NOTE — Anesthesia Postprocedure Evaluation (Signed)
Anesthesia Post Note  Patient: [name] [name]  Procedure(s) Performed: Procedure(s) (LRB): ESOPHAGOGASTRODUODENOSCOPY (EGD) (N/A)  Patient location during evaluation: PACU Anesthesia Type: General Level of consciousness: awake and alert and oriented Pain management: pain level controlled Vital Signs Assessment: post-procedure vital signs reviewed and stable Respiratory status: spontaneous breathing Cardiovascular status: blood pressure returned to baseline Anesthetic complications: no    Last Vitals:  Filed Vitals:   01/10/16 0947 01/10/16 1049  BP: 117/72 124/67  Pulse: 77 79  Temp: 36.3 C 36.4 C  Resp: 14 18    Last Pain:  Filed Vitals:   01/10/16 1049  PainSc: 0-No pain                 Averiana Clouatre

## 2016-07-07 ENCOUNTER — Other Ambulatory Visit: Payer: Self-pay | Admitting: Internal Medicine

## 2016-07-07 DIAGNOSIS — Z1231 Encounter for screening mammogram for malignant neoplasm of breast: Secondary | ICD-10-CM

## 2016-09-13 ENCOUNTER — Ambulatory Visit
Admission: RE | Admit: 2016-09-13 | Discharge: 2016-09-13 | Disposition: A | Discharge status: Home / Self Care | Payer: Commercial Managed Care - HMO | Source: Ambulatory Visit | Attending: Internal Medicine | Admitting: Internal Medicine

## 2016-09-13 ENCOUNTER — Encounter: Payer: Self-pay | Admitting: Radiology

## 2016-09-13 DIAGNOSIS — Z1231 Encounter for screening mammogram for malignant neoplasm of breast: Secondary | ICD-10-CM | POA: Diagnosis present

## 2016-09-13 HISTORY — DX: Personal history of irradiation: Z92.3

## 2016-11-03 ENCOUNTER — Inpatient Hospital Stay (HOSPITAL_BASED_OUTPATIENT_CLINIC_OR_DEPARTMENT_OTHER): Payer: Medicare PPO | Admitting: Internal Medicine

## 2016-11-03 ENCOUNTER — Inpatient Hospital Stay: Payer: Medicare PPO | Attending: Internal Medicine

## 2016-11-03 DIAGNOSIS — Z9223 Personal history of estrogen therapy: Secondary | ICD-10-CM | POA: Insufficient documentation

## 2016-11-03 DIAGNOSIS — Z79899 Other long term (current) drug therapy: Secondary | ICD-10-CM | POA: Insufficient documentation

## 2016-11-03 DIAGNOSIS — Z87440 Personal history of urinary (tract) infections: Secondary | ICD-10-CM | POA: Insufficient documentation

## 2016-11-03 DIAGNOSIS — I10 Essential (primary) hypertension: Secondary | ICD-10-CM

## 2016-11-03 DIAGNOSIS — C50912 Malignant neoplasm of unspecified site of left female breast: Secondary | ICD-10-CM

## 2016-11-03 DIAGNOSIS — Z170 Estrogen receptor positive status [ER+]: Secondary | ICD-10-CM | POA: Insufficient documentation

## 2016-11-03 DIAGNOSIS — Z8673 Personal history of transient ischemic attack (TIA), and cerebral infarction without residual deficits: Secondary | ICD-10-CM | POA: Diagnosis not present

## 2016-11-03 DIAGNOSIS — Z923 Personal history of irradiation: Secondary | ICD-10-CM

## 2016-11-03 DIAGNOSIS — C50812 Malignant neoplasm of overlapping sites of left female breast: Secondary | ICD-10-CM | POA: Insufficient documentation

## 2016-11-03 DIAGNOSIS — Z7982 Long term (current) use of aspirin: Secondary | ICD-10-CM | POA: Diagnosis not present

## 2016-11-03 DIAGNOSIS — Z853 Personal history of malignant neoplasm of breast: Secondary | ICD-10-CM | POA: Diagnosis not present

## 2016-11-03 DIAGNOSIS — E785 Hyperlipidemia, unspecified: Secondary | ICD-10-CM | POA: Insufficient documentation

## 2016-11-03 LAB — CBC WITH DIFFERENTIAL/PLATELET
Basophils Absolute: 0 10*3/uL (ref 0–0.1)
Basophils Relative: 1 %
EOS ABS: 0.1 10*3/uL (ref 0–0.7)
EOS PCT: 1 %
HCT: 37.4 % (ref 35.0–47.0)
Hemoglobin: 13.1 g/dL (ref 12.0–16.0)
LYMPHS ABS: 1.5 10*3/uL (ref 1.0–3.6)
LYMPHS PCT: 24 %
MCH: 30 pg (ref 26.0–34.0)
MCHC: 34.9 g/dL (ref 32.0–36.0)
MCV: 86.1 fL (ref 80.0–100.0)
MONO ABS: 0.5 10*3/uL (ref 0.2–0.9)
MONOS PCT: 7 %
Neutro Abs: 4.3 10*3/uL (ref 1.4–6.5)
Neutrophils Relative %: 67 %
PLATELETS: 303 10*3/uL (ref 150–440)
RBC: 4.35 MIL/uL (ref 3.80–5.20)
RDW: 14.1 % (ref 11.5–14.5)
WBC: 6.5 10*3/uL (ref 3.6–11.0)

## 2016-11-03 LAB — COMPREHENSIVE METABOLIC PANEL
ALT: 14 U/L (ref 14–54)
ANION GAP: 8 (ref 5–15)
AST: 22 U/L (ref 15–41)
Albumin: 4.2 g/dL (ref 3.5–5.0)
Alkaline Phosphatase: 95 U/L (ref 38–126)
BUN: 16 mg/dL (ref 6–20)
CHLORIDE: 100 mmol/L — AB (ref 101–111)
CO2: 26 mmol/L (ref 22–32)
CREATININE: 0.7 mg/dL (ref 0.44–1.00)
Calcium: 9.2 mg/dL (ref 8.9–10.3)
GFR calc Af Amer: >60 mL/min (ref >60)
Glucose, Bld: 75 mg/dL (ref 65–99)
POTASSIUM: 4.2 mmol/L (ref 3.5–5.1)
SODIUM: 134 mmol/L — AB (ref 135–145)
Total Bilirubin: 0.8 mg/dL (ref 0.3–1.2)
Total Protein: 7.3 g/dL (ref 6.5–8.1)

## 2016-11-03 NOTE — Assessment & Plan Note (Addendum)
#   2008 STAGE I LEFT BREAST CA-  s/p Lumpec & RT ER/PR Pos; her 2 NEG; No Chemo; Arimidex 5 years [Dr.Choksi]. Clinically no dense recurrence. Recent mammogram March 2018 normal.   # Slightly elevated blood pressure- recommend keeping a log of the blood pressures; bring them to PCP attention.   # Thyroid nodules [followed by Dr.Solum]  # Patient will follow-up with [place] in approximately one year/labs;  mammogram to be done through her PCP. CBC CMP revealed- all within normal limits.  All questions were answered. The patient knows to call the clinic with any problems, questions or concerns.

## 2016-11-03 NOTE — Progress Notes (Signed)
[place] OFFICE PROGRESS NOTE  Patient Care Team: [name], MD as PCP - General (Internal Medicine)   SUMMARY OF HEMATOLOGIC/ONCOLOGIC HISTORY:  Oncology History   # 2008 STAGE I LEFT BREAST CA- s/p Lumpec & RT ER/PR Pos; her 2 NEG; No Chemo; Arimidex 5 years [Dr.Choksi]; mammor March 2018-NEG     Carcinoma of overlapping sites of left breast in female, estrogen receptor positive ([place])     INTERVAL HISTORY:  A very pleasant 76 year old female patient with above history of stage I left breast cancer is here for follow-up. 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 Denies any unusual chest pain or shortness of breath or cough. No bone pain. States that she was recently diagnosed with thyroid nodules being followed by endocrinology. Denies any headaches. Denies any swelling in the legs.  REVIEW OF SYSTEMS:  A complete 10 point review of system is done which is negative except mentioned above/history of present illness.   PAST MEDICAL HISTORY :  Past Medical History:  Diagnosis Date  . Blood transfusion without reported diagnosis   . Breast cancer ([place]) 2009   left breast, radiation  . Diverticulosis   . Diverticulosis   . H/O bone density study    05/30/06; 06/04/08; 06/05/12  . Hemorrhoids   . History of bladder infections   . History of chicken pox   . History of CVA (cerebrovascular accident)   . Hyperlipidemia   . Personal history of radiation therapy   . Trigger finger     PAST SURGICAL HISTORY :   Past Surgical History:  Procedure Laterality Date  . APPENDECTOMY  1954  . BREAST BIOPSY Left 1986   neg  . BREAST BIOPSY Left 1988   neg  . BREAST BIOPSY Left 1995   neg  . BREAST BIOPSY Left 2009   positive (ultrasound guided biopsy)  . BREAST LUMPECTOMY    . COLONOSCOPY  07/31/2013  . COLONOSCOPY WITH PROPOFOL N/A 01/10/2016   Procedure: COLONOSCOPY WITH PROPOFOL;  Surgeon: Manya Silvas, MD;  Location: [facility] ENDOSCOPY;  Service: Endoscopy;  Laterality: N/A;  . ESOPHAGOGASTRODUODENOSCOPY  1/292015  . ESOPHAGOGASTRODUODENOSCOPY N/A 01/09/2016   Procedure: ESOPHAGOGASTRODUODENOSCOPY (EGD);  Surgeon: Manya Silvas, MD;  Location[facility] ENDOSCOPY;  Service: Endoscopy;  Laterality: N/A;  . eye lid lift    . LAPAROSCOPIC HYSTERECTOMY    . TONSILLECTOMY  1954  . WRIST SURGERY  2009, 2006   ganglion cysts removal    FAMILY HISTORY :   Family History  Problem Relation Age of Onset  . Prostate cancer Brother   . Crohn's disease Brother   . Heart attack Father   . Prostate cancer Father   . Vaginal cancer Maternal Grandmother   . Stroke Mother     SOCIAL HISTORY:   Social History  Substance Use Topics  . Smoking status: Never Smoker  . Smokeless tobacco: Never Used  . Alcohol use No    ALLERGIES:  is allergic to levofloxacin; demerol [meperidine]; other; and sulfa antibiotics.  MEDICATIONS:  Current Outpatient Prescriptions  Medication Sig Dispense Refill  . aspirin EC 81 MG tablet Take 81 mg by mouth.    . diphenhydrAMINE (BENADRYL) 25 mg capsule Take 25 mg by mouth at bedtime.     . gabapentin (NEURONTIN) 300 MG capsule Take 300 mg by mouth 2 (two) times daily.     . pantoprazole (PROTONIX) 40 MG tablet Take  1 tablet (40 mg total) by mouth daily. 30 tablet 0  . pravastatin (PRAVACHOL) 40 MG tablet Take 40 mg by mouth at bedtime.      No current facility-administered medications for this visit.     PHYSICAL EXAMINATION: ECOG PERFORMANCE STATUS: 0 - Asymptomatic  BP (!) 135/94 (BP Location: Right Arm, Patient Position: Sitting)   Pulse 89   Temp 97.6 F (36.4 C) (Tympanic)   Resp 18   Ht 5\' 1"  (1.549 m)   Wt 132 lb (59.9 kg)   BMI 24.94 kg/m   Filed Weights   11/03/16 1022  Weight: 132 lb (59.9 kg)    GENERAL: Well-nourished well-developed; Alert, no distress and comfortable.  Alone. EYES: no pallor or icterus OROPHARYNX: no thrush or  ulceration; good dentition  NECK: supple, no masses felt LYMPH:  no palpable lymphadenopathy in the cervical, axillary or inguinal regions LUNGS: clear to auscultation and  No wheeze or crackles HEART/CVS: regular rate & rhythm and no murmurs; No lower extremity edema ABDOMEN:abdomen soft, non-tender and normal bowel sounds Musculoskeletal:no cyanosis of digits and no clubbing  PSYCH: alert & oriented x 3 with fluent speech NEURO: no focal motor/sensory deficits SKIN:  no rashes or significant lesions Right and left BREAST exam [in the presence of nurse]- no unusual skin changes or dominant masses felt. Surgical scar left outer upper quadrant noted.    LABORATORY DATA:  I have reviewed the data as listed    Component Value Date/Time   NA 134 (L) 11/03/2016 1003   NA 132 (L) 10/05/2014 0951   K 4.2 11/03/2016 1003   K 3.7 10/05/2014 0951   CL 100 (L) 11/03/2016 1003   CL 96 (L) 10/05/2014 0951   CO2 26 11/03/2016 1003   CO2 29 10/05/2014 0951   GLUCOSE 75 11/03/2016 1003   GLUCOSE 132 (H) 10/05/2014 0951   BUN 16 11/03/2016 1003   BUN 10 10/05/2014 0951   CREATININE 0.70 11/03/2016 1003   CREATININE 0.65 10/05/2014 0951   CALCIUM 9.2 11/03/2016 1003   CALCIUM 9.1 10/05/2014 0951   PROT 7.3 11/03/2016 1003   PROT 7.6 10/05/2014 0951   ALBUMIN 4.2 11/03/2016 1003   ALBUMIN 4.6 10/05/2014 0951   AST 22 11/03/2016 1003   AST 25 10/05/2014 0951   ALT 14 11/03/2016 1003   ALT 19 10/05/2014 0951   ALKPHOS 95 11/03/2016 1003   ALKPHOS 110 10/05/2014 0951   BILITOT 0.8 11/03/2016 1003   BILITOT 0.7 10/05/2014 0951   GFRNONAA >60 11/03/2016 1003   GFRNONAA >60 10/05/2014 0951   GFRAA >60 11/03/2016 1003   GFRAA >60 10/05/2014 0951    No results found for: SPEP, UPEP  Lab Results  Component Value Date   WBC 6.5 11/03/2016   NEUTROABS 4.3 11/03/2016   HGB 13.1 11/03/2016   HCT 37.4 11/03/2016   MCV 86.1 11/03/2016   PLT 303 11/03/2016      Chemistry      Component  Value Date/Time   NA 134 (L) 11/03/2016 1003   NA 132 (L) 10/05/2014 0951   K 4.2 11/03/2016 1003   K 3.7 10/05/2014 0951   CL 100 (L) 11/03/2016 1003   CL 96 (L) 10/05/2014 0951   CO2 26 11/03/2016 1003   CO2 29 10/05/2014 0951   BUN 16 11/03/2016 1003   BUN 10 10/05/2014 0951   CREATININE 0.70 11/03/2016 1003   CREATININE 0.65 10/05/2014 0951      Component Value Date/Time   CALCIUM  9.2 11/03/2016 1003   CALCIUM 9.1 10/05/2014 0951   ALKPHOS 95 11/03/2016 1003   ALKPHOS 110 10/05/2014 0951   AST 22 11/03/2016 1003   AST 25 10/05/2014 0951   ALT 14 11/03/2016 1003   ALT 19 10/05/2014 0951   BILITOT 0.8 11/03/2016 1003   BILITOT 0.7 10/05/2014 0951         ASSESSMENT & PLAN:  Carcinoma of overlapping sites of left breast in female, estrogen receptor positive ([place])  # 2008 STAGE I LEFT BREAST CA-  s/p Lumpec & RT ER/PR Pos; her 2 NEG; No Chemo; Arimidex 5 years [Dr.Choksi]. Clinically no dense recurrence. Recent mammogram March 2018 normal.   # Slightly elevated blood pressure- recommend keeping a log of the blood pressures; bring them to PCP attention.   # Thyroid nodules [followed by Dr.Solum]  # Patient will follow-up with [place] in approximately one year/labs;  mammogram to be done through her PCP. CBC CMP revealed- all within normal limits.  All questions were answered. The patient knows to call the clinic with any problems, questions or concerns.        Cammie Sickle, MD 11/03/2016 1:05 PM

## 2017-07-16 ENCOUNTER — Other Ambulatory Visit: Payer: Self-pay | Admitting: Internal Medicine

## 2017-07-16 DIAGNOSIS — Z1231 Encounter for screening mammogram for malignant neoplasm of breast: Secondary | ICD-10-CM

## 2017-07-16 DIAGNOSIS — K219 Gastro-esophageal reflux disease without esophagitis: Secondary | ICD-10-CM | POA: Insufficient documentation

## 2017-07-25 DIAGNOSIS — D099 Carcinoma in situ, unspecified: Secondary | ICD-10-CM

## 2017-07-25 HISTORY — DX: Carcinoma in situ, unspecified: D09.9

## 2017-09-17 ENCOUNTER — Ambulatory Visit
Admission: RE | Admit: 2017-09-17 | Discharge: 2017-09-17 | Disposition: A | Discharge status: Home / Self Care | Payer: Medicare PPO | Source: Ambulatory Visit | Attending: Internal Medicine | Admitting: Internal Medicine

## 2017-09-17 DIAGNOSIS — Z1231 Encounter for screening mammogram for malignant neoplasm of breast: Secondary | ICD-10-CM | POA: Insufficient documentation

## 2017-11-01 ENCOUNTER — Other Ambulatory Visit: Payer: Self-pay | Admitting: *Deleted

## 2017-11-01 DIAGNOSIS — C50812 Malignant neoplasm of overlapping sites of left female breast: Secondary | ICD-10-CM

## 2017-11-01 DIAGNOSIS — Z170 Estrogen receptor positive status [ER+]: Principal | ICD-10-CM

## 2017-11-02 ENCOUNTER — Inpatient Hospital Stay: Payer: Medicare PPO | Admitting: Internal Medicine

## 2017-11-02 ENCOUNTER — Encounter: Payer: Self-pay | Admitting: Internal Medicine

## 2017-11-02 ENCOUNTER — Inpatient Hospital Stay: Payer: Medicare PPO | Attending: Internal Medicine

## 2017-11-02 ENCOUNTER — Other Ambulatory Visit: Payer: Self-pay

## 2017-11-02 VITALS — BP 142/88 | HR 78 | Temp 97.8°F | Resp 16 | Wt 127.4 lb

## 2017-11-02 DIAGNOSIS — C50812 Malignant neoplasm of overlapping sites of left female breast: Secondary | ICD-10-CM

## 2017-11-02 DIAGNOSIS — Z853 Personal history of malignant neoplasm of breast: Secondary | ICD-10-CM | POA: Diagnosis not present

## 2017-11-02 DIAGNOSIS — Z170 Estrogen receptor positive status [ER+]: Principal | ICD-10-CM

## 2017-11-02 LAB — CBC WITH DIFFERENTIAL/PLATELET
BASOS ABS: 0.1 10*3/uL (ref 0–0.1)
Basophils Relative: 1 %
EOS ABS: 0.1 10*3/uL (ref 0–0.7)
Eosinophils Relative: 2 %
HCT: 37.1 % (ref 35.0–47.0)
HEMOGLOBIN: 12.8 g/dL (ref 12.0–16.0)
LYMPHS PCT: 22 %
Lymphs Abs: 1.3 10*3/uL (ref 1.0–3.6)
MCH: 29.7 pg (ref 26.0–34.0)
MCHC: 34.6 g/dL (ref 32.0–36.0)
MCV: 85.8 fL (ref 80.0–100.0)
Monocytes Absolute: 0.4 10*3/uL (ref 0.2–0.9)
Monocytes Relative: 7 %
NEUTROS PCT: 68 %
Neutro Abs: 4.2 10*3/uL (ref 1.4–6.5)
Platelets: 328 10*3/uL (ref 150–440)
RBC: 4.32 MIL/uL (ref 3.80–5.20)
RDW: 14.8 % — ABNORMAL HIGH (ref 11.5–14.5)
WBC: 6.1 10*3/uL (ref 3.6–11.0)

## 2017-11-02 LAB — COMPREHENSIVE METABOLIC PANEL
ALT: 11 U/L — ABNORMAL LOW (ref 14–54)
AST: 20 U/L (ref 15–41)
Albumin: 4 g/dL (ref 3.5–5.0)
Alkaline Phosphatase: 82 U/L (ref 38–126)
Anion gap: 9 (ref 5–15)
BILIRUBIN TOTAL: 0.6 mg/dL (ref 0.3–1.2)
BUN: 13 mg/dL (ref 6–20)
CO2: 24 mmol/L (ref 22–32)
Calcium: 9.1 mg/dL (ref 8.9–10.3)
Chloride: 99 mmol/L — ABNORMAL LOW (ref 101–111)
Creatinine, Ser: 0.7 mg/dL (ref 0.44–1.00)
Glucose, Bld: 96 mg/dL (ref 65–99)
POTASSIUM: 4.1 mmol/L (ref 3.5–5.1)
Sodium: 132 mmol/L — ABNORMAL LOW (ref 135–145)
TOTAL PROTEIN: 7.1 g/dL (ref 6.5–8.1)

## 2017-11-02 NOTE — Progress Notes (Signed)
[place] OFFICE PROGRESS NOTE  Patient Care Team: [name], MD as PCP - General (Internal Medicine)   SUMMARY OF HEMATOLOGIC/ONCOLOGIC HISTORY:  Oncology History   # 2008 STAGE I LEFT BREAST CA- s/p Lumpec & RT ER/PR Pos; her 2 NEG; No Chemo; Arimidex 5 years [Dr.Choksi]; mammor March 2018-NEG     Carcinoma of overlapping sites of left breast in female, estrogen receptor positive ([place])     INTERVAL HISTORY:  77 year old female patient with above history of stage I left breast cancer is here for follow-up.   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  Denies any unusual body aches or joint pains.  Denies any headaches.  REVIEW OF SYSTEMS:  A complete 10 point review of system is done which is negative except mentioned above/history of present illness.   PAST MEDICAL HISTORY :  Past Medical History:  Diagnosis Date  . Blood transfusion without reported diagnosis   . Breast cancer ([place]) 2009   left breast, radiation  . Diverticulosis   . Diverticulosis   . H/O bone density study    05/30/06; 06/04/08; 06/05/12  . Hemorrhoids   . History of bladder infections   . History of chicken pox   . History of CVA (cerebrovascular accident)   . Hyperlipidemia   . Personal history of radiation therapy   . Trigger finger     PAST SURGICAL HISTORY :   Past Surgical History:  Procedure Laterality Date  . APPENDECTOMY  1954  . BREAST BIOPSY Left 1986   neg  . BREAST BIOPSY Left 1988   neg  . BREAST BIOPSY Left 1995   neg  . BREAST BIOPSY Left 2009   positive (ultrasound guided biopsy)  . BREAST LUMPECTOMY    . COLONOSCOPY  07/31/2013  . COLONOSCOPY WITH PROPOFOL N/A 01/10/2016   Procedure: COLONOSCOPY WITH PROPOFOL;  Surgeon: Manya Silvas, MD;  Location: [facility] ENDOSCOPY;  Service: Endoscopy;  Laterality: N/A;  . ESOPHAGOGASTRODUODENOSCOPY  1/292015  . ESOPHAGOGASTRODUODENOSCOPY N/A 01/09/2016   Procedure:  ESOPHAGOGASTRODUODENOSCOPY (EGD);  Surgeon: Manya Silvas, MD;  Location: [facility] ENDOSCOPY;  Service: Endoscopy;  Laterality: N/A;  . eye lid lift    . LAPAROSCOPIC HYSTERECTOMY    . TONSILLECTOMY  1954  . WRIST SURGERY  2009, 2006   ganglion cysts removal    FAMILY HISTORY :   Family History  Problem Relation Age of Onset  . Prostate cancer Brother   . Crohn's disease Brother   . Heart attack Father   . Prostate cancer Father   . Vaginal cancer Maternal Grandmother   . Stroke Mother     SOCIAL HISTORY:   Social History   Tobacco Use  . Smoking status: Never Smoker  . Smokeless tobacco: Never Used  Substance Use Topics  . Alcohol use: No    Alcohol/week: 0.0 oz  . Drug use: No    ALLERGIES:  is allergic to levofloxacin; demerol [meperidine]; other; and sulfa antibiotics.  MEDICATIONS:  Current Outpatient Medications  Medication Sig Dispense Refill  . aspirin EC 81 MG tablet Take 81 mg by mouth.    . gabapentin (NEURONTIN) 300 MG capsule Take 300 mg by mouth 2 (two) times daily.     . pantoprazole (PROTONIX) 40 MG tablet Take 1 tablet (40 mg total) by mouth daily. 30 tablet 0  . pravastatin (PRAVACHOL) 40 MG tablet Take 40 mg by mouth at bedtime.     . diphenhydrAMINE (BENADRYL)  25 mg capsule Take 25 mg by mouth at bedtime.      No current facility-administered medications for this visit.     PHYSICAL EXAMINATION: ECOG PERFORMANCE STATUS: 0 - Asymptomatic  BP (!) 142/88 (BP Location: Left Arm, Patient Position: Sitting)   Pulse 78   Temp 97.8 F (36.6 C) (Tympanic)   Resp 16   Wt 127 lb 6.4 oz (57.8 kg)   BMI 24.07 kg/m   Filed Weights   11/02/17 1116  Weight: 127 lb 6.4 oz (57.8 kg)    GENERAL: Well-nourished well-developed; Alert, no distress and comfortable.  Alone. EYES: no pallor or icterus OROPHARYNX: no thrush or ulceration; good dentition  NECK: supple, no masses felt LYMPH:  no palpable lymphadenopathy in the cervical, axillary or inguinal  regions LUNGS: clear to auscultation and  No wheeze or crackles HEART/CVS: regular rate & rhythm and no murmurs; No lower extremity edema ABDOMEN:abdomen soft, non-tender and normal bowel sounds Musculoskeletal:no cyanosis of digits and no clubbing  PSYCH: alert & oriented x 3 with fluent speech NEURO: no focal motor/sensory deficits SKIN:  no rashes or significant lesions Right and left BREAST exam [in the presence of nurse]- no unusual skin changes or dominant masses felt. Surgical scar left outer upper quadrant noted.    LABORATORY DATA:  I have reviewed the data as listed    Component Value Date/Time   NA 132 (L) 11/02/2017 1048   NA 132 (L) 10/05/2014 0951   K 4.1 11/02/2017 1048   K 3.7 10/05/2014 0951   CL 99 (L) 11/02/2017 1048   CL 96 (L) 10/05/2014 0951   CO2 24 11/02/2017 1048   CO2 29 10/05/2014 0951   GLUCOSE 96 11/02/2017 1048   GLUCOSE 132 (H) 10/05/2014 0951   BUN 13 11/02/2017 1048   BUN 10 10/05/2014 0951   CREATININE 0.70 11/02/2017 1048   CREATININE 0.65 10/05/2014 0951   CALCIUM 9.1 11/02/2017 1048   CALCIUM 9.1 10/05/2014 0951   PROT 7.1 11/02/2017 1048   PROT 7.6 10/05/2014 0951   ALBUMIN 4.0 11/02/2017 1048   ALBUMIN 4.6 10/05/2014 0951   AST 20 11/02/2017 1048   AST 25 10/05/2014 0951   ALT 11 (L) 11/02/2017 1048   ALT 19 10/05/2014 0951   ALKPHOS 82 11/02/2017 1048   ALKPHOS 110 10/05/2014 0951   BILITOT 0.6 11/02/2017 1048   BILITOT 0.7 10/05/2014 0951   GFRNONAA >60 11/02/2017 1048   GFRNONAA >60 10/05/2014 0951   GFRAA >60 11/02/2017 1048   GFRAA >60 10/05/2014 0951    No results found for: SPEP, UPEP  Lab Results  Component Value Date   WBC 6.1 11/02/2017   NEUTROABS 4.2 11/02/2017   HGB 12.8 11/02/2017   HCT 37.1 11/02/2017   MCV 85.8 11/02/2017   PLT 328 11/02/2017      Chemistry      Component Value Date/Time   NA 132 (L) 11/02/2017 1048   NA 132 (L) 10/05/2014 0951   K 4.1 11/02/2017 1048   K 3.7 10/05/2014 0951    CL 99 (L) 11/02/2017 1048   CL 96 (L) 10/05/2014 0951   CO2 24 11/02/2017 1048   CO2 29 10/05/2014 0951   BUN 13 11/02/2017 1048   BUN 10 10/05/2014 0951   CREATININE 0.70 11/02/2017 1048   CREATININE 0.65 10/05/2014 0951      Component Value Date/Time   CALCIUM 9.1 11/02/2017 1048   CALCIUM 9.1 10/05/2014 0951   ALKPHOS 82 11/02/2017 1048  ALKPHOS 110 10/05/2014 0951   AST 20 11/02/2017 1048   AST 25 10/05/2014 0951   ALT 11 (L) 11/02/2017 1048   ALT 19 10/05/2014 0951   BILITOT 0.6 11/02/2017 1048   BILITOT 0.7 10/05/2014 0951         ASSESSMENT & PLAN:  Carcinoma of overlapping sites of left breast in female, estrogen receptor positive ([place])  # 2008 STAGE I LEFT BREAST CA-  s/p Lumpec & RT ER/PR Pos; her 2 NEG; No Chemo; Arimidex 5 years [Dr.Choksi]. Clinically no evidence of recurrence. Recent mammogram March 2019- normal.   # Slightly elevated blood pressure-not well controlled; recommend checking blood pressure closely at home.  # patient will continue follow-up with PCP/annual mammograms and annual breast exams;  follow up as needed.   Cc; Dr.Johnston.         Cammie Sickle, MD 11/02/2017 1:24 PM

## 2017-11-02 NOTE — Assessment & Plan Note (Addendum)
#   2008 STAGE I LEFT BREAST CA-  s/p Lumpec & RT ER/PR Pos; her 2 NEG; No Chemo; Arimidex 5 years [Dr.Choksi]. Clinically no evidence of recurrence. Recent mammogram March 2019- normal.   # Slightly elevated blood pressure-not well controlled; recommend checking blood pressure closely at home.  # patient will continue follow-up with PCP/annual mammograms and annual breast exams;  follow up as needed.   Cc; Dr.Johnston.

## 2018-08-06 ENCOUNTER — Other Ambulatory Visit: Payer: Self-pay | Admitting: Internal Medicine

## 2018-08-06 DIAGNOSIS — Z1231 Encounter for screening mammogram for malignant neoplasm of breast: Secondary | ICD-10-CM

## 2018-08-27 ENCOUNTER — Other Ambulatory Visit: Payer: Self-pay | Admitting: Family Medicine

## 2018-08-27 ENCOUNTER — Ambulatory Visit
Admission: RE | Admit: 2018-08-27 | Discharge: 2018-08-27 | Disposition: A | Discharge status: Home / Self Care | Payer: Medicare PPO | Source: Ambulatory Visit | Attending: Family Medicine | Admitting: Family Medicine

## 2018-08-27 DIAGNOSIS — S0990XA Unspecified injury of head, initial encounter: Secondary | ICD-10-CM

## 2018-08-27 DIAGNOSIS — G44319 Acute post-traumatic headache, not intractable: Secondary | ICD-10-CM

## 2018-08-27 DIAGNOSIS — R55 Syncope and collapse: Secondary | ICD-10-CM | POA: Insufficient documentation

## 2018-09-05 ENCOUNTER — Ambulatory Visit: Admission: RE | Admit: 2018-09-05 | Payer: Medicare PPO | Source: Ambulatory Visit

## 2018-09-05 ENCOUNTER — Ambulatory Visit: Payer: Medicare PPO

## 2019-01-01 ENCOUNTER — Ambulatory Visit
Admission: RE | Admit: 2019-01-01 | Discharge: 2019-01-01 | Disposition: A | Discharge status: Home / Self Care | Payer: Medicare PPO | Source: Ambulatory Visit | Attending: Internal Medicine | Admitting: Internal Medicine

## 2019-01-01 ENCOUNTER — Other Ambulatory Visit: Payer: Self-pay

## 2019-01-01 DIAGNOSIS — Z1231 Encounter for screening mammogram for malignant neoplasm of breast: Secondary | ICD-10-CM | POA: Insufficient documentation

## 2019-01-02 ENCOUNTER — Other Ambulatory Visit: Payer: Self-pay | Admitting: Internal Medicine

## 2019-01-02 DIAGNOSIS — N6320 Unspecified lump in the left breast, unspecified quadrant: Secondary | ICD-10-CM

## 2019-01-08 ENCOUNTER — Ambulatory Visit
Admission: RE | Admit: 2019-01-08 | Discharge: 2019-01-08 | Disposition: A | Discharge status: Home / Self Care | Payer: Medicare PPO | Source: Ambulatory Visit | Attending: Internal Medicine | Admitting: Internal Medicine

## 2019-01-08 DIAGNOSIS — N6320 Unspecified lump in the left breast, unspecified quadrant: Secondary | ICD-10-CM | POA: Insufficient documentation

## 2019-01-08 DIAGNOSIS — Z853 Personal history of malignant neoplasm of breast: Secondary | ICD-10-CM | POA: Diagnosis not present

## 2019-01-08 DIAGNOSIS — Z9012 Acquired absence of left breast and nipple: Secondary | ICD-10-CM | POA: Insufficient documentation

## 2019-05-24 ENCOUNTER — Encounter: Payer: Self-pay | Admitting: Emergency Medicine

## 2019-05-24 ENCOUNTER — Emergency Department
Admission: EM | Admit: 2019-05-24 | Discharge: 2019-05-24 | Disposition: A | Discharge status: Home / Self Care | Payer: Medicare PPO | Attending: Student | Admitting: Student

## 2019-05-24 ENCOUNTER — Other Ambulatory Visit: Payer: Self-pay

## 2019-05-24 DIAGNOSIS — Z79899 Other long term (current) drug therapy: Secondary | ICD-10-CM | POA: Diagnosis not present

## 2019-05-24 DIAGNOSIS — Y9301 Activity, walking, marching and hiking: Secondary | ICD-10-CM | POA: Diagnosis not present

## 2019-05-24 DIAGNOSIS — Y999 Unspecified external cause status: Secondary | ICD-10-CM | POA: Insufficient documentation

## 2019-05-24 DIAGNOSIS — S61511A Laceration without foreign body of right wrist, initial encounter: Secondary | ICD-10-CM | POA: Insufficient documentation

## 2019-05-24 DIAGNOSIS — W01110A Fall on same level from slipping, tripping and stumbling with subsequent striking against sharp glass, initial encounter: Secondary | ICD-10-CM | POA: Diagnosis not present

## 2019-05-24 DIAGNOSIS — Z7982 Long term (current) use of aspirin: Secondary | ICD-10-CM | POA: Diagnosis not present

## 2019-05-24 DIAGNOSIS — Y92009 Unspecified place in unspecified non-institutional (private) residence as the place of occurrence of the external cause: Secondary | ICD-10-CM | POA: Insufficient documentation

## 2019-05-24 MED ORDER — LIDOCAINE HCL (PF) 1 % IJ SOLN
5.0000 mL | Freq: Once | INTRAMUSCULAR | Status: AC
  Administered 2019-05-24: 18:00:00 5 mL

## 2019-05-24 NOTE — Discharge Instructions (Signed)
Keep the wound clean, dry, and covered. See your provider in 10-14 days for suture removal.

## 2019-05-24 NOTE — ED Provider Notes (Signed)
[facility] Emergency Department Provider Note ____________________________________________  Time seen: 1730  I have reviewed the triage vital signs and the nursing notes.  HISTORY  Chief Complaint  Laceration  HPI [name] [name] is a 78 y.o. female presents with self to the ED for evaluation of an accidental fall, resulting in a laceration to the volar aspect of her right wrist.  Patient describes she was walking into her family's home, when she apparently tripped, and landed, cutting her wrist on a mirror frame.  She denies any mechanical injury or disability to the hand or wrist.  She denies any head injury, loss of consciousness, or weakness.  She presents for evaluation of a laceration to the volar wrist, with bleeding currently controlled.  She reports a current tetanus status.  Past Medical History:  Diagnosis Date  . Blood transfusion without reported diagnosis   . Breast cancer ([place]) 2009   left breast, radiation  . Diverticulosis   . Diverticulosis   . H/O bone density study    05/30/06; 06/04/08; 06/05/12  . Hemorrhoids   . History of bladder infections   . History of chicken pox   . History of CVA (cerebrovascular accident)   . Hyperlipidemia   . Personal history of radiation therapy   . Trigger finger     Patient Active Problem List   Diagnosis Date Noted  . Carcinoma of overlapping sites of left breast in female, estrogen receptor positive ([place]) 11/03/2016  . Rectal bleeding 01/07/2016    Past Surgical History:  Procedure Laterality Date  . APPENDECTOMY  1954  . BREAST BIOPSY Left 1986   neg  . BREAST BIOPSY Left 1988   neg  . BREAST BIOPSY Left 1995   neg  . BREAST BIOPSY Left 2009   positive (ultrasound guided biopsy)  . BREAST LUMPECTOMY    . COLONOSCOPY  07/31/2013  . COLONOSCOPY WITH PROPOFOL N/A 01/10/2016   Procedure: COLONOSCOPY WITH PROPOFOL;  Surgeon: Manya Silvas, MD;  Location: [facility] ENDOSCOPY;  Service: Endoscopy;   Laterality: N/A;  . ESOPHAGOGASTRODUODENOSCOPY  1/292015  . ESOPHAGOGASTRODUODENOSCOPY N/A 01/09/2016   Procedure: ESOPHAGOGASTRODUODENOSCOPY (EGD);  Surgeon: Manya Silvas, MD;  Location: [facility] ENDOSCOPY;  Service: Endoscopy;  Laterality: N/A;  . eye lid lift    . LAPAROSCOPIC HYSTERECTOMY    . TONSILLECTOMY  1954  . WRIST SURGERY  2009, 2006   ganglion cysts removal    Prior to Admission medications   Medication Sig Start Date End Date Taking? Authorizing Provider  aspirin EC 81 MG tablet Take 81 mg by mouth.    [provider]  diphenhydrAMINE (BENADRYL) 25 mg capsule Take 25 mg by mouth at bedtime.     [provider]  gabapentin (NEURONTIN) 300 MG capsule Take 300 mg by mouth 2 (two) times daily.  07/16/15   [provider]  pantoprazole (PROTONIX) 40 MG tablet Take 1 tablet (40 mg total) by mouth daily. 01/10/16   Bettey Costa, MD  pravastatin (PRAVACHOL) 40 MG tablet Take 40 mg by mouth at bedtime.  06/09/15   [provider]    Allergies Levofloxacin, Demerol [meperidine], Other, and Sulfa antibiotics  Family History  Problem Relation Age of Onset  . Prostate cancer Brother   . Crohn's disease Brother   . Heart attack Father   . Prostate cancer Father   . Vaginal cancer Maternal Grandmother   . Stroke Mother   . Breast cancer Neg Hx     Social History  Social History   Tobacco Use  . Smoking status: Never Smoker  . Smokeless tobacco: Never Used  Substance Use Topics  . Alcohol use: No    Alcohol/week: 0.0 standard drinks  . Drug use: No    Review of Systems  Constitutional: Negative for fever. Cardiovascular: Negative for chest pain. Respiratory: Negative for shortness of breath. Musculoskeletal: Negative for back pain. Skin: Negative for rash.  Right wrist laceration as above. Neurological: Negative for headaches, focal weakness or numbness. ____________________________________________  PHYSICAL EXAM:  VITAL SIGNS: ED  Triage Vitals  Enc Vitals Group     BP 05/24/19 1639 (!) 165/86     Pulse Rate 05/24/19 1639 84     Resp 05/24/19 1639 16     Temp 05/24/19 1639 98.4 F (36.9 C)     Temp Source 05/24/19 1639 Oral     SpO2 05/24/19 1639 98 %     Weight 05/24/19 1639 120 lb (54.4 kg)     Height 05/24/19 1639 5\' 2"  (1.575 m)     Head Circumference --      Peak Flow --      Pain Score 05/24/19 1646 5     Pain Loc --      Pain Edu? --      Excl. in [place]? --     Constitutional: Alert and oriented. Well appearing and in no distress. Head: Normocephalic and atraumatic. Eyes: Conjunctivae are normal. Normal extraocular movements Cardiovascular: Normal rate, regular rhythm. Normal distal pulses. Respiratory: Normal respiratory effort.  Musculoskeletal: Normal composite fist on the right.  Nontender with normal range of motion in all extremities.  Neurologic:  Normal gait without ataxia. Normal speech and language. No gross focal neurologic deficits are appreciated.  Right volar wrist with a 4 cm curvilinear laceration at the distal portion.  No active bleeding is appreciated.  Subcu fat is visualized.  No visualized tendon or vessel damage is appreciated. Skin:  Skin is warm, dry and intact. No rash noted. ___________________________________________  PROCEDURES  .Marland KitchenLaceration Repair  Date/Time: 05/24/2019 5:33 PM Performed by: Melvenia Needles, PA-C Authorized by: Melvenia Needles, PA-C   Consent:    Consent obtained:  Verbal   Consent given by:  Patient   Risks discussed:  Pain and poor wound healing Anesthesia (see MAR for exact dosages):    Anesthesia method:  Local infiltration   Local anesthetic:  Lidocaine 1% w/o epi Laceration details:    Location:  Hand   Hand location:  R wrist   Length (cm):  4   Depth (mm):  4 Repair type:    Repair type:  Simple Pre-procedure details:    Preparation:  Patient was prepped and draped in usual sterile fashion Exploration:    Hemostasis  achieved with:  Direct pressure Treatment:    Area cleansed with:  Betadine and saline   Amount of cleaning:  Standard   Irrigation solution:  Sterile saline   Irrigation method:  Tap Skin repair:    Repair method:  Sutures   Suture size:  3-0   Suture material:  Nylon   Suture technique:  Simple interrupted   Number of sutures:  5 Approximation:    Approximation:  Close Post-procedure details:    Dressing:  Non-adherent dressing and bulky dressing   Patient tolerance of procedure:  Tolerated well, no immediate complications   ____________________________________________  INITIAL IMPRESSION / ASSESSMENT AND PLAN / ED COURSE  Patient with ED evaluation of an accidental laceration  to the volar right wrist.  Patient exam is overall benign without any signs of musculoskeletal injury.  The wound is repaired with nylon sutures, and good wound edge approximation is achieved.  Patient is discharged with wound care instructions and supplies.  She will follow with primary provider for suture removal in 10 to 14 days.  [name] [name] was evaluated in Emergency Department on 05/24/2019 for the symptoms described in the history of present illness. She was evaluated in the context of the global COVID-19 pandemic, which necessitated consideration that the patient might be at risk for infection with the SARS-CoV-2 virus that causes COVID-19. Institutional protocols and algorithms that pertain to the evaluation of patients at risk for COVID-19 are in a state of rapid change based on information released by regulatory bodies including the CDC and federal and state organizations. These policies and algorithms were followed during the patient's care in the ED. ____________________________________________  FINAL CLINICAL IMPRESSION(S) / ED DIAGNOSES  Final diagnoses:  Wrist laceration, right, initial encounter      Melvenia Needles, PA-C 05/24/19 2028    Lilia Pro., MD 05/25/19  2009

## 2019-05-24 NOTE — ED Notes (Signed)
See triage note. Pt with approximately 2inch lac to R hand/wrist. Pt A&Ox4. Provider currently stitching/suturing site. Pt calmly sitting in bed; alert. Pt reports normal sensation in hand, warmth, color appropriate, pt has full movement at site.

## 2019-05-24 NOTE — ED Notes (Signed)
First Nurse Note: Pt to ED stating that she fell and has a cut on her right wrist. Pt is in NAD.

## 2019-05-24 NOTE — ED Triage Notes (Signed)
Laceration wrist. States just prior to arrival tripped on step and cut wrist on frame. Bleeding controlled at present.

## 2019-07-22 ENCOUNTER — Other Ambulatory Visit: Payer: Self-pay | Admitting: Internal Medicine

## 2019-07-22 DIAGNOSIS — Z1231 Encounter for screening mammogram for malignant neoplasm of breast: Secondary | ICD-10-CM

## 2020-01-09 ENCOUNTER — Ambulatory Visit
Admission: RE | Admit: 2020-01-09 | Discharge: 2020-01-09 | Disposition: A | Discharge status: Home / Self Care | Payer: Medicare PPO | Source: Ambulatory Visit | Attending: Internal Medicine | Admitting: Internal Medicine

## 2020-01-09 DIAGNOSIS — Z1231 Encounter for screening mammogram for malignant neoplasm of breast: Secondary | ICD-10-CM | POA: Diagnosis not present

## 2020-01-20 DIAGNOSIS — E039 Hypothyroidism, unspecified: Secondary | ICD-10-CM | POA: Insufficient documentation

## 2020-06-02 ENCOUNTER — Ambulatory Visit: Payer: Medicare PPO | Admitting: Dermatology

## 2020-06-02 ENCOUNTER — Other Ambulatory Visit: Payer: Self-pay

## 2020-06-02 DIAGNOSIS — L578 Other skin changes due to chronic exposure to nonionizing radiation: Secondary | ICD-10-CM

## 2020-06-02 DIAGNOSIS — D485 Neoplasm of uncertain behavior of skin: Secondary | ICD-10-CM

## 2020-06-02 DIAGNOSIS — Z85828 Personal history of other malignant neoplasm of skin: Secondary | ICD-10-CM | POA: Diagnosis not present

## 2020-06-02 NOTE — Progress Notes (Signed)
   Follow-Up Visit   Subjective  [name] [name] is a 79 y.o. female who presents for the following: Lesion (Patient here today for a spot on her head. Present for a long time, does not bother patient but she wants to make sure it's ok. Patient did have a [place] at left eyebrow > 20 years ago.  ).  The following portions of the chart were reviewed this encounter and updated as appropriate:   Tobacco  Allergies  Meds  Problems  Med Hx  Surg Hx  Fam Hx      Review of Systems:  No other skin or systemic complaints except as noted in HPI or Assessment and Plan.  Objective  Well appearing patient in no apparent distress; mood and affect are within normal limits.  A focused examination was performed including face, scalp. Relevant physical exam findings are noted in the Assessment and Plan.  Objective  Mid Frontal Scalp: 2.1 x 1.0cm scaly pink plaque      Assessment & Plan  Neoplasm of uncertain behavior of skin Mid Frontal Scalp  Skin / nail biopsy Type of biopsy: punch   Informed consent: discussed and consent obtained   Timeout: patient name, date of birth, surgical site, and procedure verified   Patient was prepped and draped in usual sterile fashion: Area prepped with isopropyl alcohol. Anesthesia: the lesion was anesthetized in a standard fashion   Anesthetic:  1% lidocaine w/ epinephrine 1-100,000 buffered w/ 8.4% NaHCO3 Punch size:  3 mm Suture size:  3-0 Suture type: Prolene (polypropylene)   Suture removal (days):  14 Hemostasis achieved with: suture and aluminum chloride   Outcome: patient tolerated procedure well   Post-procedure details: wound care instructions given   Additional details:  Mupirocin and a dressing applied  Specimen 1 - Surgical pathology Differential Diagnosis: r/o SCC vs SK Check Margins: No 2.1 x 1.0cm scaly pink plaque  Actinic Damage - chronic, secondary to cumulative UV radiation exposure/sun exposure over time - diffuse scaly  erythematous macules with underlying dyspigmentation - Recommend daily broad spectrum sunscreen SPF 30+ to sun-exposed areas, reapply every 2 hours as needed.  - Call for new or changing lesions.  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  - Call if any new or changing lesions are noted between office visits   Return in about 2 weeks (around 06/16/2020) for Suture Removal.  Graciella Belton, RMA, am acting as scribe for Forest Gleason, MD .  Documentation: I have reviewed the above documentation for accuracy and completeness, and I agree with the above.  Forest Gleason, MD

## 2020-06-02 NOTE — Patient Instructions (Signed)

## 2020-06-08 ENCOUNTER — Telehealth: Payer: Self-pay

## 2020-06-08 NOTE — Telephone Encounter (Signed)
Patient advised biopsy benign SK, can treat with LN2 if becomes bothersome, [name] [name]

## 2020-06-08 NOTE — Progress Notes (Signed)
Skin , mid frontal scalp SEBORRHEIC KERATOSIS, IRRITATED   This is a benign growth or "wisdom spot". No additional treatment is needed unless it's bothersome. If bothersome, we can treat with liquid nitrogen.  MAs please call

## 2020-06-08 NOTE — Telephone Encounter (Signed)
-----   Message from [place], MD sent at 06/08/2020  1:59 PM EST ----- Skin , mid frontal scalp SEBORRHEIC KERATOSIS, IRRITATED   This is a benign growth or "wisdom spot". No additional treatment is needed unless it's bothersome. If bothersome, we can treat with liquid nitrogen.  MAs please call

## 2020-06-14 ENCOUNTER — Encounter: Payer: Self-pay | Admitting: Dermatology

## 2020-06-15 ENCOUNTER — Other Ambulatory Visit: Payer: Self-pay

## 2020-06-15 ENCOUNTER — Ambulatory Visit (INDEPENDENT_AMBULATORY_CARE_PROVIDER_SITE_OTHER): Payer: Medicare PPO | Admitting: Dermatology

## 2020-06-15 ENCOUNTER — Encounter: Payer: Self-pay | Admitting: Dermatology

## 2020-06-15 DIAGNOSIS — L578 Other skin changes due to chronic exposure to nonionizing radiation: Secondary | ICD-10-CM

## 2020-06-15 MED ORDER — IMIQUIMOD 5 % EX CREA: TOPICAL_CREAM | CUTANEOUS | 1 refills | Status: DC

## 2020-06-15 NOTE — Progress Notes (Signed)
   Follow-Up Visit   Subjective  [name] [name] is a 79 y.o. female who presents for the following: Follow-up (Patient here today for suture removal at the mid frontal scalp, excision showed benign SK. ).  She also has some crusted areas on her face present for a long time. She has used an over the counter moisturizer with some improvement.  The following portions of the chart were reviewed this encounter and updated as appropriate:   Tobacco  Allergies  Meds  Problems  Med Hx  Surg Hx  Fam Hx      Review of Systems:  No other skin or systemic complaints except as noted in HPI or Assessment and Plan.  Objective  Well appearing patient in no apparent distress; mood and affect are within normal limits.  A focused examination was performed including scalp, face. Relevant physical exam findings are noted in the Assessment and Plan.     Assessment & Plan    Actinic Damage - Severe, chronic, secondary to cumulative UV radiation exposure over time - diffuse scaly erythematous macules and papules with underlying dyspigmentation at multiple areas of the face - Discussed Prescription "Field Treatment" for Severe, Chronic Confluent Actinic Changes with Pre-Cancerous Actinic Keratoses Field treatment involves treatment of an entire area of skin that has confluent Actinic Changes (Sun/ Ultraviolet light damage) and PreCancerous Actinic Keratoses by method of PhotoDynamic Therapy (PDT) and/or prescription Topical Chemotherapy agents such as 5-fluorouracil, 5-fluorouracil/calcipotriene, and/or imiquimod.  The purpose is to decrease the number of clinically evident and subclinical PreCancerous lesions to prevent progression to development of skin cancer by chemically destroying early precancer changes that may or may not be visible.  It has been shown to reduce the risk of developing skin cancer in the treated area. As a result of treatment, redness, scaling, crusting, and open sores may occur  during treatment course. One or more than one of these methods may be used and may have to be used several times to control, suppress and eliminate the PreCancerous changes. Discussed treatment course, expected reaction, and possible side effects. - Recommend daily broad spectrum sunscreen SPF 30+ to sun-exposed areas, reapply every 2 hours as needed.  - Call for new or changing lesions. - She had a bad experience with fluorouracil in the past. Discussed options of 5-fluorouracil/calcipotriene or imiquimod. She prefers imiquimod. - Start imiquimod 5% 3 times weekly for 2 months to affected areas at Right Tip of Nose, nasal dorsum, R nasal sidewall, R nasofacial, R temple, L temple  Encounter for Removal of Sutures - Incision site at the mid frontal scalp is clean, dry and intact - Wound cleansed, sutures removed, wound cleansed and steri strips applied.  - Discussed pathology results showing seborrheic keratosis  - Patient advised to keep steri-strips dry until they fall off. - Scars remodel for a full year. - Once steri-strips fall off, patient can apply over-the-counter silicone scar cream each night to help with scar remodeling if desired. - Patient advised to call with any concerns or if they notice any new or changing lesions.   Return for TBSE.  Graciella Belton, RMA, am acting as scribe for Forest Gleason, MD .  Documentation: I have reviewed the above documentation for accuracy and completeness, and I agree with the above.  Forest Gleason, MD

## 2020-08-18 ENCOUNTER — Other Ambulatory Visit: Payer: Self-pay | Admitting: Internal Medicine

## 2020-08-18 DIAGNOSIS — Z1231 Encounter for screening mammogram for malignant neoplasm of breast: Secondary | ICD-10-CM

## 2020-11-18 ENCOUNTER — Encounter: Payer: Medicare PPO | Admitting: Dermatology

## 2021-01-11 ENCOUNTER — Other Ambulatory Visit: Payer: Self-pay

## 2021-01-11 ENCOUNTER — Ambulatory Visit
Admission: RE | Admit: 2021-01-11 | Discharge: 2021-01-11 | Disposition: A | Discharge status: Home / Self Care | Payer: Medicare PPO | Source: Ambulatory Visit | Attending: Internal Medicine | Admitting: Internal Medicine

## 2021-01-11 DIAGNOSIS — Z1231 Encounter for screening mammogram for malignant neoplasm of breast: Secondary | ICD-10-CM | POA: Insufficient documentation

## 2021-02-11 ENCOUNTER — Encounter: Payer: Self-pay | Admitting: Ophthalmology

## 2021-02-15 NOTE — Discharge Instructions (Signed)

## 2021-02-18 ENCOUNTER — Encounter
Admission: RE | Disposition: A | Discharge status: Home / Self Care | Payer: Self-pay | Source: Home / Self Care | Attending: Ophthalmology

## 2021-02-18 ENCOUNTER — Ambulatory Visit: Payer: Medicare PPO | Admitting: Anesthesiology

## 2021-02-18 ENCOUNTER — Encounter: Payer: Self-pay | Admitting: Ophthalmology

## 2021-02-18 ENCOUNTER — Ambulatory Visit
Admission: RE | Admit: 2021-02-18 | Discharge: 2021-02-18 | Disposition: A | Discharge status: Home / Self Care | Payer: Medicare PPO | Attending: Ophthalmology | Admitting: Ophthalmology

## 2021-02-18 ENCOUNTER — Other Ambulatory Visit: Payer: Self-pay

## 2021-02-18 DIAGNOSIS — I10 Essential (primary) hypertension: Secondary | ICD-10-CM | POA: Diagnosis not present

## 2021-02-18 DIAGNOSIS — E785 Hyperlipidemia, unspecified: Secondary | ICD-10-CM | POA: Insufficient documentation

## 2021-02-18 DIAGNOSIS — Z85828 Personal history of other malignant neoplasm of skin: Secondary | ICD-10-CM | POA: Insufficient documentation

## 2021-02-18 DIAGNOSIS — Z8673 Personal history of transient ischemic attack (TIA), and cerebral infarction without residual deficits: Secondary | ICD-10-CM | POA: Diagnosis not present

## 2021-02-18 DIAGNOSIS — Z881 Allergy status to other antibiotic agents status: Secondary | ICD-10-CM | POA: Diagnosis not present

## 2021-02-18 DIAGNOSIS — Z9049 Acquired absence of other specified parts of digestive tract: Secondary | ICD-10-CM | POA: Insufficient documentation

## 2021-02-18 DIAGNOSIS — H02403 Unspecified ptosis of bilateral eyelids: Secondary | ICD-10-CM | POA: Insufficient documentation

## 2021-02-18 DIAGNOSIS — Z8049 Family history of malignant neoplasm of other genital organs: Secondary | ICD-10-CM | POA: Insufficient documentation

## 2021-02-18 DIAGNOSIS — Z8249 Family history of ischemic heart disease and other diseases of the circulatory system: Secondary | ICD-10-CM | POA: Diagnosis not present

## 2021-02-18 DIAGNOSIS — Z8379 Family history of other diseases of the digestive system: Secondary | ICD-10-CM | POA: Diagnosis not present

## 2021-02-18 DIAGNOSIS — Z8619 Personal history of other infectious and parasitic diseases: Secondary | ICD-10-CM | POA: Insufficient documentation

## 2021-02-18 DIAGNOSIS — Z90710 Acquired absence of both cervix and uterus: Secondary | ICD-10-CM | POA: Insufficient documentation

## 2021-02-18 DIAGNOSIS — Z853 Personal history of malignant neoplasm of breast: Secondary | ICD-10-CM | POA: Diagnosis not present

## 2021-02-18 DIAGNOSIS — Z885 Allergy status to narcotic agent status: Secondary | ICD-10-CM | POA: Diagnosis not present

## 2021-02-18 DIAGNOSIS — Z8719 Personal history of other diseases of the digestive system: Secondary | ICD-10-CM | POA: Diagnosis not present

## 2021-02-18 DIAGNOSIS — Z7982 Long term (current) use of aspirin: Secondary | ICD-10-CM | POA: Diagnosis not present

## 2021-02-18 DIAGNOSIS — Z882 Allergy status to sulfonamides status: Secondary | ICD-10-CM | POA: Insufficient documentation

## 2021-02-18 DIAGNOSIS — Z8042 Family history of malignant neoplasm of prostate: Secondary | ICD-10-CM | POA: Insufficient documentation

## 2021-02-18 HISTORY — PX: BROW LIFT: SHX178

## 2021-02-18 HISTORY — DX: Nausea with vomiting, unspecified: R11.2

## 2021-02-18 HISTORY — DX: Essential (primary) hypertension: I10

## 2021-02-18 HISTORY — DX: Other specified postprocedural states: Z98.890

## 2021-02-18 SURGERY — BLEPHAROPLASTY
Anesthesia: General | Site: Eye

## 2021-02-18 MED ORDER — OXYCODONE HCL 5 MG/5ML PO SOLN: 5.0000 mg | Freq: Once | ORAL | Status: DC | PRN

## 2021-02-18 MED ORDER — LIDOCAINE HCL (CARDIAC) PF 100 MG/5ML IV SOSY
PREFILLED_SYRINGE | INTRAVENOUS | Status: DC | PRN
  Administered 2021-02-18: 20 mg via INTRAVENOUS

## 2021-02-18 MED ORDER — TETRACAINE HCL 0.5 % OP SOLN
OPHTHALMIC | Status: DC | PRN
  Administered 2021-02-18: 1 [drp] via OPHTHALMIC

## 2021-02-18 MED ORDER — OXYCODONE HCL 5 MG PO TABS: 5.0000 mg | ORAL_TABLET | Freq: Once | ORAL | Status: DC | PRN

## 2021-02-18 MED ORDER — PROMETHAZINE HCL 25 MG/ML IJ SOLN: 6.2500 mg | INTRAMUSCULAR | Status: DC | PRN

## 2021-02-18 MED ORDER — MIDAZOLAM HCL 2 MG/2ML IJ SOLN
INTRAMUSCULAR | Status: DC | PRN
  Administered 2021-02-18: 1 mg via INTRAVENOUS

## 2021-02-18 MED ORDER — TRAMADOL HCL 50 MG PO TABS: ORAL_TABLET | ORAL | 0 refills | Status: DC

## 2021-02-18 MED ORDER — ERYTHROMYCIN 5 MG/GM OP OINT: TOPICAL_OINTMENT | OPHTHALMIC | 2 refills | Status: DC

## 2021-02-18 MED ORDER — ERYTHROMYCIN 5 MG/GM OP OINT
TOPICAL_OINTMENT | OPHTHALMIC | Status: DC | PRN
  Administered 2021-02-18: 1 via OPHTHALMIC

## 2021-02-18 MED ORDER — BSS IO SOLN
INTRAOCULAR | Status: DC | PRN
  Administered 2021-02-18: 15 mL

## 2021-02-18 MED ORDER — FENTANYL CITRATE PF 50 MCG/ML IJ SOSY: 25.0000 ug | PREFILLED_SYRINGE | INTRAMUSCULAR | Status: DC | PRN

## 2021-02-18 MED ORDER — PROPOFOL 500 MG/50ML IV EMUL
INTRAVENOUS | Status: DC | PRN
  Administered 2021-02-18: 25 ug/kg/min via INTRAVENOUS

## 2021-02-18 MED ORDER — ALFENTANIL 500 MCG/ML IJ INJ
INJECTION | INTRAVENOUS | Status: DC | PRN
  Administered 2021-02-18: 200 ug via INTRAVENOUS

## 2021-02-18 MED ORDER — BACITRACIN 500 UNIT/GM OP OINT: TOPICAL_OINTMENT | OPHTHALMIC | Status: DC | PRN

## 2021-02-18 MED ORDER — LACTATED RINGERS IV SOLN: INTRAVENOUS | Status: DC

## 2021-02-18 MED ORDER — LIDOCAINE-EPINEPHRINE 2 %-1:100000 IJ SOLN
INTRAMUSCULAR | Status: DC | PRN
  Administered 2021-02-18: 1 mL via OPHTHALMIC

## 2021-02-18 SURGICAL SUPPLY — 21 items
APPLICATOR COTTON TIP WD 3 STR (MISCELLANEOUS) ×2 IMPLANT
BLADE SURG 15 STRL LF DISP TIS (BLADE) ×1 IMPLANT
BLADE SURG 15 STRL SS (BLADE) ×2
CORD BIP STRL DISP 12FT (MISCELLANEOUS) ×2 IMPLANT
GAUZE SPONGE 4X4 12PLY STRL (GAUZE/BANDAGES/DRESSINGS) ×2 IMPLANT
GLOVE BIO SURGEON STRL SZ7 (GLOVE) ×4 IMPLANT
GOWN STRL REUS W/ TWL LRG LVL3 (GOWN DISPOSABLE) ×1 IMPLANT
GOWN STRL REUS W/TWL LRG LVL3 (GOWN DISPOSABLE) ×2
MARKER SKIN XFINE TIP W/RULER (MISCELLANEOUS) ×2 IMPLANT
NEEDLE FILTER BLUNT 18X 1/2SAF (NEEDLE) ×1
NEEDLE FILTER BLUNT 18X1 1/2 (NEEDLE) ×1 IMPLANT
NEEDLE HYPO 30X.5 LL (NEEDLE) ×4 IMPLANT
PACK ENT CUSTOM (PACKS) ×2 IMPLANT
SOL PREP PVP 2OZ (MISCELLANEOUS) ×2
SOLUTION PREP PVP 2OZ (MISCELLANEOUS) ×1 IMPLANT
SPONGE GAUZE 2X2 8PLY STRL LF (GAUZE/BANDAGES/DRESSINGS) ×20 IMPLANT
SUT GUT PLAIN 6-0 1X18 ABS (SUTURE) ×2 IMPLANT
SUT PROLENE 6 0 P 1 18 (SUTURE) ×4 IMPLANT
SYR 10ML LL (SYRINGE) ×2 IMPLANT
SYR 3ML LL SCALE MARK (SYRINGE) ×2 IMPLANT
WATER STERILE IRR 250ML POUR (IV SOLUTION) ×2 IMPLANT

## 2021-02-18 NOTE — Op Note (Signed)
Preoperative Diagnosis:  Visually significant blepharoptosis bilateral  Upper Eyelid(s)  Postoperative Diagnosis:  Same.  Procedure(s) Performed:   Blepharoptosis repair with levator aponeurosis advancement bilateral  Upper Eyelid(s)  Teaching Surgeon: Philis Pique. Vickki Muff, M.D.  Assistants: none  Anesthesia: MAC  Specimens: None.  Estimated Blood Loss: Minimal.  Complications: None.  Operative Findings: None Dictated  PROCEDURE:  Allergies were reviewed and the patient is allergic to levofloxacin, demerol [meperidine], other, and sulfa antibiotics..   After the risks, benefits, complications and alternatives were discussed with the patient, appropriate informed consent was obtained. While seated in an upright position and looking in primary gaze, the mid pupillary line was marked on the upper eyelid margins bilaterally. The patient was then brought to the operating suite and reclined supine.  Timeout was conducted and the patient was sedated. Local anesthetic consisting of a 50-50 mixture of 2% lidocaine with epinephrine and 0.75% bupivacaine with added Hylenex was injected subcutaneously to the both  upper eyelid(s). After adequate local was instilled, the patient was prepped and draped in the usual sterile fashion for eyelid surgery.   Attention was turned to the upper eyelids. A 38m upper eyelid crease incision line was marked with calipers on both  upper eyelid(s).  Attention was turned to the  right  upper eyelid. A #15 blade was used to open the premarked incision line and hemostasis was obtained with bipolar cautery. Westcott scissors were then used to transect through orbicularis for the length of the incision down to the tarsal plate. Epitarsus was dissected to create a smooth surface to suture to. Dissection was then carried superiorly in the plane between orbicularis and orbital septum. Once the preaponeurotic fat pocket was identified, the orbital septum was opened. This  revealed the levator and its aponeurosis.    Attention was then turned to the opposite eyelid where the same procedure was performed in the same manner.   3 interrupted 6-0 Prolene sutures were then passed partial thickness through the tarsal plates of both  upper eyelid(s). These sutures were placed in line with the mid pupillary, medial limbal, and lateral limbal lines. The sutures were fixed to the levator aponeurosis and adjusted until a nice lid height and contour were achieved. Once nice symmetry was achieved, the skin incisions were closed with a running 6-0 fast absorbing plain suture. The patient tolerated the procedure well. Erythromycin ophthalmic ointment was applied to the incision site(s) followed by ice packs. The patient was taken to the recovery area where she recovered without difficulty.  Post-Op Plan/Instructions:  Ms. AKluverwas instructed to use ice packs frequently for the next 48 hours. Her was instructed to use Erythromycin ophthalmic ointment on her incisions 4 times a day for the next 12 to 14 days. She was given a prescription for tramadol (or similar) for pain control should Tylenol not be effective. She was asked to to [facility] as needed for problems.  [name] [name] M. FVickki Muff M.D. Ophthalmology

## 2021-02-18 NOTE — Transfer of Care (Signed)
Immediate Anesthesia Transfer of Care Note  Patient: [name] [name]  Procedure(s) Performed: BLEPHAROPTOSIS REPAIR; RESECT EX BILATERAL (Eye)  Patient Location: PACU  Anesthesia Type: General  Level of Consciousness: awake, alert  and patient cooperative  Airway and Oxygen Therapy: Patient Spontanous Breathing and Patient connected to supplemental oxygen  Post-op Assessment: Post-op Vital signs reviewed, Patient's Cardiovascular Status Stable, Respiratory Function Stable, Patent Airway and No signs of Nausea or vomiting  Post-op Vital Signs: Reviewed and stable  Complications: No notable events documented.

## 2021-02-18 NOTE — Anesthesia Procedure Notes (Signed)
Date/Time: 02/18/2021 8:04 AM Performed by: Mayme Genta, CRNA Pre-anesthesia Checklist: Patient identified, Emergency Drugs available, Suction available, Timeout performed and Patient being monitored Patient Re-evaluated:Patient Re-evaluated prior to induction Oxygen Delivery Method: Nasal cannula Placement Confirmation: positive ETCO2

## 2021-02-18 NOTE — Anesthesia Postprocedure Evaluation (Signed)
Anesthesia Post Note  Patient: LILIA SHEPHERD  Procedure(s) Performed: BLEPHAROPTOSIS REPAIR; RESECT EX BILATERAL (Eye)     Patient location during evaluation: PACU Anesthesia Type: General Level of consciousness: awake and alert Pain management: pain level controlled Vital Signs Assessment: post-procedure vital signs reviewed and stable Respiratory status: spontaneous breathing, nonlabored ventilation, respiratory function stable and patient connected to nasal cannula oxygen Cardiovascular status: blood pressure returned to baseline and stable Postop Assessment: no apparent nausea or vomiting Anesthetic complications: no   No notable events documented.  Maddyx Wieck, Glade Stanford

## 2021-02-18 NOTE — Interval H&P Note (Signed)
History and Physical Interval Note:  02/18/2021 7:39 AM  [name] [name]  has presented today for surgery, with the diagnosis of H02.403 Ptosis of Eyelid, Unspecified, Bilateral.  The various methods of treatment have been discussed with the patient and family. After consideration of risks, benefits and other options for treatment, the patient has consented to  Procedure(s): BLEPHAROPTOSIS REPAIR; RESECT EX BILATERAL (Bilateral) as a surgical intervention.  The patient's history has been reviewed, patient examined, no change in status, stable for surgery.  I have reviewed the patient's chart and labs.  Questions were answered to the patient's satisfaction.     Vickki Muff, [name] M

## 2021-02-18 NOTE — Anesthesia Preprocedure Evaluation (Signed)
Anesthesia Evaluation  Patient identified by MRN, date of birth, ID band Patient awake    Reviewed: Allergy & Precautions, H&P , NPO status , Patient's Chart, lab work & pertinent test results, reviewed documented beta blocker date and time   History of Anesthesia Complications (+) PONV and history of anesthetic complications (PONV)  Airway Mallampati: II  TM Distance: >3 FB Neck ROM: full    Dental no notable dental hx.    Pulmonary neg pulmonary ROS,    Pulmonary exam normal breath sounds clear to auscultation       Cardiovascular Exercise Tolerance: Good hypertension, negative cardio ROS   Rhythm:regular Rate:Normal     Neuro/Psych negative neurological ROS  negative psych ROS   GI/Hepatic negative GI ROS, Neg liver ROS,   Endo/Other  negative endocrine ROS  Renal/GU negative Renal ROS  negative genitourinary   Musculoskeletal   Abdominal   Peds  Hematology negative hematology ROS (+)   Anesthesia Other Findings   Reproductive/Obstetrics negative OB ROS                             Anesthesia Physical Anesthesia Plan  ASA: 2  Anesthesia Plan: General   Post-op Pain Management:    Induction:   PONV Risk Score and Plan:   Airway Management Planned:   Additional Equipment:   Intra-op Plan:   Post-operative Plan:   Informed Consent: I have reviewed the patients History and Physical, chart, labs and discussed the procedure including the risks, benefits and alternatives for the proposed anesthesia with the patient or authorized representative who has indicated his/her understanding and acceptance.     Dental Advisory Given  Plan Discussed with: CRNA  Anesthesia Plan Comments:         Anesthesia Quick Evaluation

## 2021-02-18 NOTE — H&P (Signed)
[place]: [facility]  Primary Care Physician:  Baxter Hire, MD Ophthalmologist: Dr. Philis Pique. Vickki Muff, M.D.  Pre-Procedure History & Physical: HPI:  [name] [name] is a 80 y.o. female here for periocular surgery.   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   Trigger finger     Past Surgical History:  Procedure Laterality Date   ABDOMINAL HYSTERECTOMY     APPENDECTOMY  1954   BREAST BIOPSY Left 1986   neg   BREAST BIOPSY Left 1988   neg   BREAST BIOPSY Left 1995   neg   BREAST BIOPSY Left 2009   positive (ultrasound guided biopsy)   BREAST LUMPECTOMY     COLONOSCOPY  07/31/2013   COLONOSCOPY WITH PROPOFOL N/A 01/10/2016   Procedure: COLONOSCOPY WITH PROPOFOL;  Surgeon: Manya Silvas, MD;  Location: [place];  Service: Endoscopy;  Laterality: N/A;   ESOPHAGOGASTRODUODENOSCOPY  1/292015   ESOPHAGOGASTRODUODENOSCOPY N/A 01/09/2016   Procedure: ESOPHAGOGASTRODUODENOSCOPY (EGD);  Surgeon: Manya Silvas, MD;  Location: [facility] ENDOSCOPY;  Service: Endoscopy;  Laterality: N/A;   eye lid lift     [place]   WRIST SURGERY  2009, 2006   ganglion cysts removal    Prior to Admission medications   Medication Sig Start Date End Date Taking? Authorizing Provider  aspirin EC 81 MG tablet Take 81 mg by mouth.   Yes [provider]  diphenhydrAMINE (BENADRYL) 25 mg capsule Take 25 mg by mouth  at bedtime.    Yes [provider]  gabapentin (NEURONTIN) 300 MG capsule Take 300 mg by mouth 2 (two) times daily.  07/16/15  Yes [provider]  imiquimod (ALDARA) 5 % cream Apply to affected areas three times weekly for 2 months. 06/15/20  Yes Moye, [place], MD  losartan (COZAAR) 25 MG tablet Take 25 mg by mouth daily.   Yes [provider]  pantoprazole (PROTONIX) 40 MG tablet Take 1 tablet (40 mg total) by mouth daily. 01/10/16  Yes Mody, Ulice Bold, MD  pravastatin (PRAVACHOL) 40 MG tablet Take 40 mg by mouth at bedtime.  06/09/15  Yes [provider]    Allergies as of 01/11/2021 - Review Complete 06/15/2020  Allergen Reaction Noted   Levofloxacin Other (See Comments) 11/04/2015   Demerol [meperidine] Nausea Only 01/07/2016   Other Nausea And Vomiting 11/04/2015   Sulfa antibiotics Nausea Only 01/07/2016    Family History  Problem Relation Age of Onset   Prostate cancer Brother    Crohn's disease Brother    Heart attack Father    Prostate cancer Father    Vaginal cancer Maternal Grandmother    Stroke Mother    Breast cancer Neg Hx     Social History   Socioeconomic History   Marital status: Widowed    Spouse name: Not on file   Number  of children: Not on file   Years of education: Not on file   Highest education level: Not on file  Occupational History   Not on file  Tobacco Use   Smoking status: Never   Smokeless tobacco: Never  Substance and Sexual Activity   Alcohol use: No    Alcohol/week: 0.0 standard drinks   Drug use: No   Sexual activity: Not on file  Other Topics Concern   Not on file  Social History Narrative   Not on file   Social Determinants of Health   Financial Resource Strain: Not on file  Food Insecurity: Not on file  Transportation Needs: Not on file  Physical Activity: Not on file  Stress: Not on file  Social Connections: Not on file  Intimate Partner Violence: Not on file    Review of Systems: See  HPI, otherwise negative ROS  Physical Exam: BP (!) 172/79   Pulse 86   Temp (!) 97.2 F (36.2 C) (Temporal)   Ht '5\' 1"'$  (1.549 m)   Wt 54 kg   SpO2 97%   BMI 22.48 kg/m  General:   Alert and cooperative in NAD Head:  Normocephalic and atraumatic. Respiratory:  Normal work of breathing.  Impression/Plan: [name] [name] is here for periocular surgery.  Risks, benefits, limitations, and alternatives regarding surgery have been reviewed with the patient.  Questions have been answered.  All parties agreeable.   Karle Starch, MD  02/18/2021, 7:39 AM

## 2021-02-21 ENCOUNTER — Encounter: Payer: Self-pay | Admitting: Ophthalmology

## 2021-07-19 ENCOUNTER — Ambulatory Visit (INDEPENDENT_AMBULATORY_CARE_PROVIDER_SITE_OTHER): Payer: No Typology Code available for payment source | Admitting: Dermatology

## 2021-07-19 ENCOUNTER — Other Ambulatory Visit: Payer: Self-pay

## 2021-07-19 DIAGNOSIS — C44629 Squamous cell carcinoma of skin of left upper limb, including shoulder: Secondary | ICD-10-CM | POA: Diagnosis not present

## 2021-07-19 DIAGNOSIS — Z0100 Encounter for examination of eyes and vision without abnormal findings: Secondary | ICD-10-CM | POA: Diagnosis not present

## 2021-07-19 DIAGNOSIS — E039 Hypothyroidism, unspecified: Secondary | ICD-10-CM | POA: Diagnosis not present

## 2021-07-19 DIAGNOSIS — E7800 Pure hypercholesterolemia, unspecified: Secondary | ICD-10-CM | POA: Diagnosis not present

## 2021-07-19 DIAGNOSIS — C4492 Squamous cell carcinoma of skin, unspecified: Secondary | ICD-10-CM

## 2021-07-19 DIAGNOSIS — D492 Neoplasm of unspecified behavior of bone, soft tissue, and skin: Secondary | ICD-10-CM

## 2021-07-19 HISTORY — DX: Squamous cell carcinoma of skin, unspecified: C44.92

## 2021-07-19 NOTE — Progress Notes (Signed)
° °  Follow-Up Visit   Subjective  [name] [name] is a 81 y.o. female who presents for the following: Skin Problem (Check a growth on the left arm appeared about 2 months ago, will not go away. ).   The following portions of the chart were reviewed this encounter and updated as appropriate:   Tobacco   Allergies   Meds   Problems   Med Hx   Surg Hx   Fam Hx       Review of Systems:  No other skin or systemic complaints except as noted in HPI or Assessment and Plan.  Objective  Well appearing patient in no apparent distress; mood and affect are within normal limits.  A focused examination was performed including left arm. Relevant physical exam findings are noted in the Assessment and Plan.  Left Forearm - Anterior 0.7 cm firm pink papule              Assessment & Plan  Neoplasm of skin Left Forearm - Anterior  Skin / nail biopsy Type of biopsy: tangential   Informed consent: discussed and consent obtained   Timeout: patient name, date of birth, surgical site, and procedure verified   Procedure prep:  Patient was prepped and draped in usual sterile fashion Prep type:  Isopropyl alcohol Anesthesia: the lesion was anesthetized in a standard fashion   Anesthetic:  1% lidocaine w/ epinephrine 1-100,000 buffered w/ 8.4% NaHCO3 Instrument used: flexible razor blade   Hemostasis achieved with: aluminum chloride   Outcome: patient tolerated procedure well   Post-procedure details: wound care instructions given   Additional details:  Petrolatum and a pressure bandage applied  Specimen 1 - Surgical pathology Differential Diagnosis: R/O SCC >>amelanotic    Check Margins: No  R/O SCC >>amelanotic    Keep scheduled  I, Marye Round, CMA, am acting as scribe for Forest Gleason, MD .   Documentation: I have reviewed the above documentation for accuracy and completeness, and I agree with the above.  Forest Gleason, MD

## 2021-07-19 NOTE — Patient Instructions (Addendum)

## 2021-07-21 ENCOUNTER — Telehealth: Payer: Self-pay

## 2021-07-21 NOTE — Telephone Encounter (Signed)
Patient advised bx results SCC, will treat with EDC on follow up.js

## 2021-07-24 ENCOUNTER — Encounter: Payer: Self-pay | Admitting: Dermatology

## 2021-08-18 ENCOUNTER — Encounter: Payer: Self-pay | Admitting: Dermatology

## 2021-08-18 ENCOUNTER — Ambulatory Visit (INDEPENDENT_AMBULATORY_CARE_PROVIDER_SITE_OTHER): Payer: No Typology Code available for payment source | Admitting: Dermatology

## 2021-08-18 ENCOUNTER — Other Ambulatory Visit: Payer: Self-pay

## 2021-08-18 DIAGNOSIS — L578 Other skin changes due to chronic exposure to nonionizing radiation: Secondary | ICD-10-CM | POA: Diagnosis not present

## 2021-08-18 DIAGNOSIS — Z1283 Encounter for screening for malignant neoplasm of skin: Secondary | ICD-10-CM

## 2021-08-18 DIAGNOSIS — D229 Melanocytic nevi, unspecified: Secondary | ICD-10-CM

## 2021-08-18 DIAGNOSIS — C4492 Squamous cell carcinoma of skin, unspecified: Secondary | ICD-10-CM

## 2021-08-18 DIAGNOSIS — L570 Actinic keratosis: Secondary | ICD-10-CM

## 2021-08-18 DIAGNOSIS — L814 Other melanin hyperpigmentation: Secondary | ICD-10-CM | POA: Diagnosis not present

## 2021-08-18 DIAGNOSIS — D1800 Hemangioma unspecified site: Secondary | ICD-10-CM

## 2021-08-18 DIAGNOSIS — C44629 Squamous cell carcinoma of skin of left upper limb, including shoulder: Secondary | ICD-10-CM | POA: Diagnosis not present

## 2021-08-18 DIAGNOSIS — Z85828 Personal history of other malignant neoplasm of skin: Secondary | ICD-10-CM | POA: Diagnosis not present

## 2021-08-18 DIAGNOSIS — Z872 Personal history of diseases of the skin and subcutaneous tissue: Secondary | ICD-10-CM | POA: Diagnosis not present

## 2021-08-18 DIAGNOSIS — L821 Other seborrheic keratosis: Secondary | ICD-10-CM | POA: Diagnosis not present

## 2021-08-18 NOTE — Patient Instructions (Addendum)
Wound Care Instructions  Cleanse wound gently with soap and water once a day then pat dry with clean gauze. Apply a thing coat of Petrolatum (petroleum jelly, "Vaseline") over the wound (unless you have an allergy to this). We recommend that you use a new, sterile tube of Vaseline. Do not pick or remove scabs. Do not remove the yellow or white "healing tissue" from the base of the wound.  Cover the wound with fresh, clean, nonstick gauze and secure with paper tape. You may use Band-Aids in place of gauze and tape if the would is small enough, but would recommend trimming much of the tape off as there is often too much. Sometimes Band-Aids can irritate the skin.  You should call the office for your biopsy report after 1 week if you have not already been contacted.  If you experience any problems, such as abnormal amounts of bleeding, swelling, significant bruising, significant pain, or evidence of infection, please call the office immediately.  FOR ADULT SURGERY PATIENTS: If you need something for pain relief you may take 1 extra strength Tylenol (acetaminophen) AND 2 Ibuprofen (200mg  each) together every 4 hours as needed for pain. (do not take these if you are allergic to them or if you have a reason you should not take them.) Typically, you may only need pain medication for 1 to 3 days.   Cryotherapy Aftercare  Wash gently with soap and water everyday.   Apply Vaseline and Band-Aid daily until healed.   Prior to procedure, discussed risks of blister formation, small wound, skin dyspigmentation, or rare scar following cryotherapy. Recommend Vaseline ointment to treated areas while healing.    Recommend taking Heliocare sun protection supplement daily in sunny weather for additional sun protection. For maximum protection on the sunniest days, you can take up to 2 capsules of regular Heliocare OR take 1 capsule of Heliocare Ultra. For prolonged exposure (such as a full day in the sun), you can  repeat your dose of the supplement 4 hours after your first dose. Heliocare can be purchased at [employer], at some [facility] or at [email or web address].    Recommend daily broad spectrum sunscreen SPF 30+ to sun-exposed areas, reapply every 2 hours as needed. Call for new or changing lesions.  Staying in the shade or wearing long sleeves, sun glasses (UVA+UVB protection) and wide brim hats (4-inch brim around the entire circumference of the hat) are also recommended for sun protection.   Melanoma ABCDEs  Melanoma is the most dangerous type of skin cancer, and is the leading cause of death from skin disease.  You are more likely to develop melanoma if you: Have light-colored skin, light-colored eyes, or red or blond hair Spend a lot of time in the sun Tan regularly, either outdoors or in a tanning bed Have had blistering sunburns, especially during childhood Have a close family member who has had a melanoma Have atypical moles or large birthmarks  Early detection of melanoma is key since treatment is typically straightforward and cure rates are extremely high if we catch it early.   The first sign of melanoma is often a change in a mole or a new dark spot.  The ABCDE system is a way of remembering the signs of melanoma.  A for asymmetry:  The two halves do not match. B for border:  The edges of the growth are irregular. C for color:  A mixture of colors are present instead of an even brown color. D for diameter:  Melanomas are usually (but not always) greater than 65mm - the size of a pencil eraser. E for evolution:  The spot keeps changing in size, shape, and color.  Please check your skin once per month between visits. You can use a small mirror in front and a large mirror behind you to keep an eye on the back side or your body.   If you see any new or changing lesions before your next follow-up, please call to schedule a visit.  Please continue daily skin protection including  broad spectrum sunscreen SPF 30+ to sun-exposed areas, reapplying every 2 hours as needed when you're outdoors.   Staying in the shade or wearing long sleeves, sun glasses (UVA+UVB protection) and wide brim hats (4-inch brim around the entire circumference of the hat) are also recommended for sun protection.    If You Need Anything After Your Visit  If you have any questions or concerns for your doctor, please call our main line at [phone] and press option 4 to reach your doctor's medical assistant. If no one answers, please leave a voicemail as directed and we will return your call as soon as possible. Messages left after 4 pm will be answered the following business day.   You may also send [place] a message via [place]. We typically respond to MyChart messages within 1-2 business days.  For prescription refills, please ask your pharmacy to contact our office. Our fax number is [phone].  If you have an urgent issue when the clinic is closed that cannot wait until the next business day, you can page your doctor at the number below.    Please note that while we do our best to be available for urgent issues outside of office hours, we are not available 24/7.   If you have an urgent issue and are unable to reach [place], you may choose to seek medical care at your doctor's office, retail clinic, urgent care center, or emergency room.  If you have a medical emergency, please immediately call 911 or go to the emergency department.  Pager Numbers  - Dr. Nehemiah Massed: [phone]  - Dr. Laurence Ferrari: [phone]  - Dr. Nicole Kindred: [phone]  In the event of inclement weather, please call our main line at [phone] for an update on the status of any delays or closures.  Dermatology Medication Tips: Please keep the boxes that topical medications come in in order to help keep track of the instructions about where and how to use these. Pharmacies typically print the medication instructions only on the  boxes and not directly on the medication tubes.   If your medication is too expensive, please contact our office at [phone] option 4 or send [place] a message through [place].   We are unable to tell what your co-pay for medications will be in advance as this is different depending on your insurance coverage. However, we may be able to find a substitute medication at lower cost or fill out paperwork to get insurance to cover a needed medication.   If a prior authorization is required to get your medication covered by your insurance company, please allow [place] 1-2 business days to complete this process.  Drug prices often vary depending on where the prescription is filled and some pharmacies may offer cheaper prices.  The website www.goodrx.com contains coupons for medications through different pharmacies. The prices here do not account for what the cost may be with help from insurance (it may be cheaper with your insurance), but the  website can give you the price if you did not use any insurance.  - You can print the associated coupon and take it with your prescription to the pharmacy.  - You may also stop by our office during regular business hours and pick up a GoodRx coupon card.  - If you need your prescription sent electronically to a different pharmacy, notify our office through [facility] or by phone at [phone] option 4.     Si Usted Necesita Algo Despus de Su Visita  Tambin puede enviarnos un mensaje a travs de Pharmacist, community. Por lo general respondemos a los mensajes de MyChart en el transcurso de 1 a 2 das hbiles.  Para renovar recetas, por favor pida a su farmacia que se ponga en contacto con nuestra oficina. Harland Dingwall de fax es [place] [phone].  Si tiene un asunto urgente cuando la clnica est cerrada y que no puede esperar hasta el siguiente da hbil, puede llamar/localizar a su doctor(a) al nmero que aparece a continuacin.   Por favor, tenga en cuenta que  aunque hacemos todo lo posible para estar disponibles para asuntos urgentes fuera del horario de [place], no estamos disponibles las 24 horas del da, los 7 das de la [place].   Si tiene un problema urgente y no puede comunicarse con nosotros, puede optar por buscar atencin mdica  en el consultorio de su doctor(a), en una clnica privada, en un centro de atencin urgente o en una sala de emergencias.  Si tiene Engineering geologist, por favor llame inmediatamente al 911 o vaya a la sala de emergencias.  Nmeros de bper  - Dr. Nehemiah Massed: [phone]  - Dra. Moye: [phone]  - Dra. Nicole Kindred: [phone]  En caso de inclemencias del [place], por favor llame a Johnsie Kindred principal al [phone] para una actualizacin sobre el [place] de cualquier retraso o cierre.  Consejos para la medicacin en dermatologa: Por favor, guarde las cajas en las que vienen los medicamentos de uso tpico para ayudarle a seguir las instrucciones sobre dnde y cmo usarlos. Las farmacias generalmente imprimen las instrucciones del medicamento slo en las cajas y no directamente en los tubos del [place].   Si su medicamento es muy caro, por favor, pngase en contacto con Zigmund Daniel llamando al [phone] y presione la opcin 4 o envenos un mensaje a travs de Pharmacist, community.   No podemos decirle cul ser su copago por los medicamentos por adelantado ya que esto es diferente dependiendo de la cobertura de su seguro. Sin embargo, es posible que podamos encontrar un medicamento sustituto a Electrical engineer un formulario para que el seguro cubra el medicamento que se considera necesario.   Si se requiere una autorizacin previa para que su compaa de seguros [place] su medicamento, por favor permtanos de 1 a 2 das hbiles para completar este proceso.  Los precios de los medicamentos varan con frecuencia dependiendo del Environmental consultant de dnde se surte la receta y alguna farmacias pueden ofrecer precios ms  baratos.  El sitio web www.goodrx.com tiene cupones para medicamentos de Airline pilot. Los precios aqu no tienen en cuenta lo que podra costar con la ayuda del seguro (puede ser ms barato con su seguro), pero el sitio web puede darle el precio si no utiliz Research scientist (physical sciences).  - Puede imprimir el cupn correspondiente y llevarlo con su receta a la farmacia.  - Tambin puede pasar por nuestra oficina durante el horario de atencin regular y Charity fundraiser una tarjeta de cupones de GoodRx.  -  Si necesita que su receta se enve electrnicamente a Chiropodist, informe a nuestra oficina a travs de MyChart de [place] o por telfono llamando al [phone] y presione la opcin 4.

## 2021-08-18 NOTE — Progress Notes (Signed)
Follow-Up Visit   Subjective  [name] [name] is a 81 y.o. female who presents for the following: Annual Exam (Here for skin cancer screening. Full body. HxSCC's. HxAK's. Has used chemotherapy cream with another dermatologist) and Skin Cancer (Here for EDC Tx of SCC on left dorsal forearm).  The patient presents for Total-Body Skin Exam (TBSE) for skin cancer screening and mole check.  The patient has spots, moles and lesions to be evaluated, some may be new or changing and the patient has concerns that these could be cancer.   The following portions of the chart were reviewed this encounter and updated as appropriate:  Tobacco   Allergies   Meds   Problems   Med Hx   Surg Hx   Fam Hx       Review of Systems: No other skin or systemic complaints except as noted in HPI or Assessment and Plan.   Objective  Well appearing patient in no apparent distress; mood and affect are within normal limits.  A full examination was performed including scalp, head, eyes, ears, nose, lips, neck, chest, axillae, abdomen, back, buttocks, bilateral upper extremities, bilateral lower extremities, hands, feet, fingers, toes, fingernails, and toenails. All findings within normal limits unless otherwise noted below.  Left Dorsal Forearm Pink scaly papule  Right forehead x1, right cheek x1, right nasal supratip/ala x1, left zygoma x1, right ear x1 (5) Erythematous thin papules/macules with gritty scale.   Left Ankle  x1, right medial calf x1, left dorsal hand x2, left dorsal forearm x3, left upper arm x1, mid chest x1 (9) Erythematous hyperkeratotic lesions   Assessment & Plan   Lentigines - Scattered tan macules - Due to sun exposure - Benign-appearing, observe - Recommend daily broad spectrum sunscreen SPF 30+ to sun-exposed areas, reapply every 2 hours as needed. - Call for any changes  Seborrheic Keratoses - Stuck-on, waxy, tan-brown papules and/or plaques  - Benign-appearing - Discussed  benign etiology and prognosis. - Observe - Call for any changes  Melanocytic Nevi - Tan-brown and/or pink-flesh-colored symmetric macules and papules - Benign appearing on exam today - Observation - Call clinic for new or changing moles - Recommend daily use of broad spectrum spf 30+ sunscreen to sun-exposed areas.   Hemangiomas - Red papules - Discussed benign nature - Observe - Call for any changes  Actinic Damage - Severe, confluent actinic changes with pre-cancerous actinic keratoses  - Severe, chronic, not at goal, secondary to cumulative UV radiation exposure over time - diffuse scaly erythematous macules and papules with underlying dyspigmentation - Discussed Prescription "Field Treatment" for Severe, Chronic Confluent Actinic Changes with Pre-Cancerous Actinic Keratoses Field treatment involves treatment of an entire area of skin that has confluent Actinic Changes (Sun/ Ultraviolet light damage) and PreCancerous Actinic Keratoses by method of PhotoDynamic Therapy (PDT) and/or prescription Topical Chemotherapy agents such as 5-fluorouracil, 5-fluorouracil/calcipotriene, and/or imiquimod.  The purpose is to decrease the number of clinically evident and subclinical PreCancerous lesions to prevent progression to development of skin cancer by chemically destroying early precancer changes that may or may not be visible.  It has been shown to reduce the risk of developing skin cancer in the treated area. As a result of treatment, redness, scaling, crusting, and open sores may occur during treatment course. One or more than one of these methods may be used and may have to be used several times to control, suppress and eliminate the PreCancerous changes. Discussed treatment course, expected reaction, and possible side effects. -  Recommend daily broad spectrum sunscreen SPF 30+ to sun-exposed areas, reapply every 2 hours as needed.  - Staying in the shade or wearing long sleeves, sun glasses  (UVA+UVB protection) and wide brim hats (4-inch brim around the entire circumference of the hat) are also recommended. - Call for new or changing lesions.  - Patient defers for now as she had a very bad experience with field treatment the past.    History of Squamous Cell Carcinoma of the Skin - No evidence of recurrence today - No lymphadenopathy - Recommend regular full body skin exams - Recommend daily broad spectrum sunscreen SPF 30+ to sun-exposed areas, reapply every 2 hours as needed.  - Call if any new or changing lesions are noted between office visits     Skin cancer screening performed today.  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  Destruction of lesion  Destruction method: electrodesiccation and curettage   Informed consent: discussed and consent obtained   Timeout:  patient name, date of birth, surgical site, and procedure verified Anesthesia: the lesion was anesthetized in a standard fashion   Anesthetic:  1% lidocaine w/ epinephrine 1-100,000 buffered w/ 8.4% NaHCO3 Curettage performed in three different directions: Yes   Electrodesiccation performed over the curetted area: Yes   Curettage cycles:  3 Final wound size (cm):  0.9 Hemostasis achieved with:  electrodesiccation Outcome: patient tolerated procedure well with no complications   Post-procedure details: sterile dressing applied and wound care instructions given   Dressing type: petrolatum    AK (actinic keratosis) (5) Right forehead x1, right cheek x1, right nasal supratip/ala x1, left zygoma x1, right ear x1  Actinic keratoses are precancerous spots that appear secondary to cumulative UV radiation exposure/sun exposure over time. They are chronic with expected duration over 1 year. A portion of actinic keratoses will progress to squamous cell carcinoma of the skin. It is not possible to reliably predict which spots will progress to skin cancer and so treatment is recommended to prevent  development of skin cancer.  Recommend daily broad spectrum sunscreen SPF 30+ to sun-exposed areas, reapply every 2 hours as needed.  Recommend staying in the shade or wearing long sleeves, sun glasses (UVA+UVB protection) and wide brim hats (4-inch brim around the entire circumference of the hat). Call for new or changing lesions.  Destruction of lesion - Right forehead x1, right cheek x1, right nasal supratip/ala x1, left zygoma x1, right ear x1  Destruction method: cryotherapy   Informed consent: discussed and consent obtained   Lesion destroyed using liquid nitrogen: Yes   Outcome: patient tolerated procedure well with no complications   Post-procedure details: wound care instructions given   Additional details:  Prior to procedure, discussed risks of blister formation, small wound, skin dyspigmentation, or rare scar following cryotherapy. Recommend Vaseline ointment to treated areas while healing.   Hypertrophic actinic keratosis (9) Left Ankle  x1, right medial calf x1, left dorsal hand x2, left dorsal forearm x3, left upper arm x1, mid chest x1  Actinic keratoses are precancerous spots that appear secondary to cumulative UV radiation exposure/sun exposure over time. They are chronic with expected duration over 1 year. A portion of actinic keratoses will progress to squamous cell carcinoma of the skin. It is not possible to reliably predict which spots will progress to skin cancer and so treatment is recommended to prevent development of skin cancer.  Recommend daily broad spectrum sunscreen SPF 30+ to sun-exposed areas, reapply every 2 hours as needed.  Recommend staying in the shade or wearing long sleeves, sun glasses (UVA+UVB protection) and wide brim hats (4-inch brim around the entire circumference of the hat). Call for new or changing lesions.  Prior to procedure, discussed risks of blister formation, small wound, skin dyspigmentation, or rare scar following cryotherapy.  Recommend Vaseline ointment to treated areas while healing.   Destruction of lesion - Left Ankle  x1, right medial calf x1, left dorsal hand x2, left dorsal forearm x3, left upper arm x1, mid chest x1  Destruction method: cryotherapy   Informed consent: discussed and consent obtained   Lesion destroyed using liquid nitrogen: Yes   Outcome: patient tolerated procedure well with no complications   Post-procedure details: wound care instructions given     Return in about 6 months (around 02/15/2022) for TBSE.  I, Emelia Salisbury, CMA, am acting as scribe for Forest Gleason, MD.  Documentation: I have reviewed the above documentation for accuracy and completeness, and I agree with the above.  Forest Gleason, MD

## 2021-08-29 ENCOUNTER — Encounter: Payer: Self-pay | Admitting: Dermatology

## 2021-09-27 ENCOUNTER — Other Ambulatory Visit: Payer: Self-pay | Admitting: Internal Medicine

## 2021-09-27 DIAGNOSIS — Z1231 Encounter for screening mammogram for malignant neoplasm of breast: Secondary | ICD-10-CM

## 2021-12-14 DIAGNOSIS — H0011 Chalazion right upper eyelid: Secondary | ICD-10-CM | POA: Diagnosis not present

## 2021-12-18 ENCOUNTER — Emergency Department: Payer: No Typology Code available for payment source

## 2021-12-18 ENCOUNTER — Encounter: Payer: Self-pay | Admitting: Medical Oncology

## 2021-12-18 ENCOUNTER — Emergency Department
Admission: EM | Admit: 2021-12-18 | Discharge: 2021-12-18 | Disposition: A | Discharge status: Home / Self Care | Payer: No Typology Code available for payment source | Attending: Emergency Medicine | Admitting: Emergency Medicine

## 2021-12-18 DIAGNOSIS — I10 Essential (primary) hypertension: Secondary | ICD-10-CM | POA: Diagnosis not present

## 2021-12-18 DIAGNOSIS — R531 Weakness: Secondary | ICD-10-CM | POA: Diagnosis not present

## 2021-12-18 DIAGNOSIS — R519 Headache, unspecified: Secondary | ICD-10-CM | POA: Diagnosis not present

## 2021-12-18 DIAGNOSIS — N390 Urinary tract infection, site not specified: Secondary | ICD-10-CM | POA: Diagnosis not present

## 2021-12-18 DIAGNOSIS — Z8673 Personal history of transient ischemic attack (TIA), and cerebral infarction without residual deficits: Secondary | ICD-10-CM | POA: Insufficient documentation

## 2021-12-18 DIAGNOSIS — R55 Syncope and collapse: Secondary | ICD-10-CM | POA: Diagnosis not present

## 2021-12-18 LAB — BASIC METABOLIC PANEL
Anion gap: 10 (ref 5–15)
BUN: 18 mg/dL (ref 8–23)
CO2: 25 mmol/L (ref 22–32)
Calcium: 9.3 mg/dL (ref 8.9–10.3)
Chloride: 99 mmol/L (ref 98–111)
Creatinine, Ser: 0.71 mg/dL (ref 0.44–1.00)
GFR, Estimated: >60 mL/min (ref >60)
Glucose, Bld: 120 mg/dL — ABNORMAL HIGH (ref 70–99)
Potassium: 4.2 mmol/L (ref 3.5–5.1)
Sodium: 134 mmol/L — ABNORMAL LOW (ref 135–145)

## 2021-12-18 LAB — CBC
HCT: 39.4 % (ref 36.0–46.0)
Hemoglobin: 12.9 g/dL (ref 12.0–15.0)
MCH: 29.1 pg (ref 26.0–34.0)
MCHC: 32.7 g/dL (ref 30.0–36.0)
MCV: 88.9 fL (ref 80.0–100.0)
Platelets: 353 10*3/uL (ref 150–400)
RBC: 4.43 MIL/uL (ref 3.87–5.11)
RDW: 13.8 % (ref 11.5–15.5)
WBC: 6.6 10*3/uL (ref 4.0–10.5)
nRBC: 0 % (ref 0.0–0.2)

## 2021-12-18 LAB — URINALYSIS, ROUTINE W REFLEX MICROSCOPIC
Bilirubin Urine: NEGATIVE
Glucose, UA: NEGATIVE mg/dL
Hgb urine dipstick: NEGATIVE
Ketones, ur: NEGATIVE mg/dL
Nitrite: NEGATIVE
Protein, ur: NEGATIVE mg/dL
Specific Gravity, Urine: 1.012 (ref 1.005–1.030)
pH: 5 (ref 5.0–8.0)

## 2021-12-18 LAB — D-DIMER, QUANTITATIVE: D-Dimer, Quant: 0.46 ug/mL-FEU (ref 0.00–0.50)

## 2021-12-18 LAB — TROPONIN I (HIGH SENSITIVITY): Troponin I (High Sensitivity): <2 ng/L (ref <18)

## 2021-12-18 MED ORDER — SODIUM CHLORIDE 0.9 % IV BOLUS
500.0000 mL | Freq: Once | INTRAVENOUS | Status: AC
  Administered 2021-12-18: 500 mL via INTRAVENOUS

## 2021-12-18 MED ORDER — SODIUM CHLORIDE 0.9 % IV SOLN
1.0000 g | Freq: Once | INTRAVENOUS | Status: AC
  Administered 2021-12-18: 1 g via INTRAVENOUS
  Filled 2021-12-18: qty 10

## 2021-12-18 MED ORDER — CEPHALEXIN 500 MG PO CAPS: 500.0000 mg | ORAL_CAPSULE | Freq: Three times a day (TID) | ORAL | 0 refills | Status: DC

## 2021-12-18 NOTE — ED Triage Notes (Addendum)
Pt reports generalized weakness with dizzy spells. Reports headache also. Pt ambulatory to triage without difficulty. Reports that she also feels sob. Denies chest pain. Pt reports that this isn't the first time that this has happened, pt has been having episodes for a few weeks, that come and go.

## 2021-12-18 NOTE — ED Provider Notes (Signed)
[facility] Provider Note    Event Date/Time   First MD Initiated Contact with Patient 12/18/21 1523     (approximate)  History   Chief Complaint: Near Syncope  HPI  [name] [name] is a 81 y.o. female with a past medical history of hypertension, hyperlipidemia, CVA, presents to the emergency department with generalized weakness and dizziness.  According to the patient for years now she has been experiencing intermittent dizziness however over the past several days she has felt more weak.  She states while at church today she began feeling weak and dizzy so she came to the emergency department.  Patient denies any chest pain.  Denies abdominal pain denies any vomiting diarrhea or dysuria.  States she was feeling somewhat short of breath earlier but states that has since resolved.  Physical Exam   Triage Vital Signs: ED Triage Vitals  Enc Vitals Group     BP 12/18/21 1122 (!) 156/68     Pulse Rate 12/18/21 1122 92     Resp 12/18/21 1122 20     Temp 12/18/21 1122 98.5 F (36.9 C)     Temp Source 12/18/21 1122 Oral     SpO2 12/18/21 1122 98 %     Weight 12/18/21 1118 119 lb 0.8 oz (54 kg)     Height 12/18/21 1118 '5\' 1"'$  (1.549 m)     Head Circumference --      Peak Flow --      Pain Score 12/18/21 1118 8     Pain Loc --      Pain Edu? --      Excl. in [place]? --     Most recent vital signs: Vitals:   12/18/21 1122 12/18/21 1536  BP: (!) 156/68 (!) 144/77  Pulse: 92 88  Resp: 20 17  Temp: 98.5 F (36.9 C) 98 F (36.7 C)  SpO2: 98% 96%    General: Awake, no distress.  CV:  Good peripheral perfusion.  Regular rate and rhythm  Resp:  Normal effort.  Equal breath sounds bilaterally.  Abd:  No distention.  Soft, nontender.  No rebound or guarding. Other:  Equal grip strength bilaterally.  No pronator drift.  Cranial nerves intact.   ED Results / Procedures / Treatments   EKG  EKG viewed and interpreted by myself shows a normal sinus rhythm at 93  bpm with a narrow QRS, normal axis, normal intervals, nonspecific but no concerning ST changes.  RADIOLOGY  I have interpreted the CT head images, no large bleed seen on my evaluation. CT scan of the head read as negative by radiology   MEDICATIONS ORDERED IN ED: Medications  cefTRIAXone (ROCEPHIN) 1 g in sodium chloride 0.9 % 100 mL IVPB (1 g Intravenous New Bag/Given 12/18/21 1548)  sodium chloride 0.9 % bolus 500 mL (500 mLs Intravenous New Bag/Given 12/18/21 1548)     IMPRESSION / MDM / ASSESSMENT AND PLAN / ED COURSE  I reviewed the triage vital signs and the nursing notes.  Patient's presentation is most consistent with acute presentation with potential threat to life or bodily function.  Patient presents to the emergency department with generalized weakness dizziness/lightheadedness earlier today.  Patient states she has had similar symptoms over the past 5 years but is more severe today.  Denies any chest pain.  Says she was feeling somewhat short of breath but that is since resolved.  Denies any nausea vomiting or diarrhea.  No dysuria.  Patient has a reassuring  physical exam.  Lab work is reassuring as well including a normal chemistry, normal CBC and a negative troponin.  Patient's urinalysis has resulted positive for urinary tract infection which very well could be the cause of the patient's symptoms.  We will also check a D-dimer as a precaution given the shortness of breath earlier although denies any shortness of breath currently.  D-dimer is negative.  Given the patient's presentation I did consider admission however given her reassuring work-up I believe the patient would be safe for discharge home with outpatient antibiotics.  Discussed return precautions.  Patient agreeable to plan.  We will follow-up with her doctor.  FINAL CLINICAL IMPRESSION(S) / ED DIAGNOSES   Weakness Urinary tract infection  Note:  This document was prepared using Dragon voice recognition  software and may include unintentional dictation errors.   Harvest Dark, MD 12/18/21 1755

## 2021-12-18 NOTE — ED Provider Triage Note (Signed)
Emergency Medicine Provider Triage Evaluation Note  [name] [name] , a 81 y.o. female  was evaluated in triage.  Pt complains of lightheadedness and weakness since church this morning. She felt well when she awoke. She didn't sleep well the night before and took a benadryl which she doesn't typically do. Daughter reports that she has been having "dizzy spells" for the past several weeks. No full syncope. Patient reports headache. She ate ensure and breakfast bar today. Felt SOB at church, no pain/pressure in chest. Stood to sing and felt lightheaded and had to sit down. No N/V/D. Headache began around 10am this morning at church, gradual and not sudden onset. Also feels palpitations occasionally.   Patient Active Problem List   Diagnosis Date Noted   Carcinoma of overlapping sites of left breast in female, estrogen receptor positive ([place]) 11/03/2016   Rectal bleeding 01/07/2016     Review of Systems  Positive: Lightheadedness, headache, sob, full body weakness Negative: N/v, ataxia, no speech changes  Physical Exam  There were no vitals taken for this visit. Gen:   Awake, no distress   Resp:  Normal effort  MSK:   Moves extremities without difficulty  Other:    Medical Decision Making  Medically screening exam initiated at 11:16 AM.  Appropriate orders placed.  [name] was informed that the remainder of the evaluation will be completed by another provider, this initial triage assessment does not replace that evaluation, and the importance of remaining in the ED until their evaluation is complete.     Marquette Old, PA-C 12/18/21 1122

## 2021-12-21 LAB — URINE CULTURE: Culture: 50000 — AB

## 2022-01-05 DIAGNOSIS — L980 Pyogenic granuloma: Secondary | ICD-10-CM | POA: Diagnosis not present

## 2022-01-11 DIAGNOSIS — K219 Gastro-esophageal reflux disease without esophagitis: Secondary | ICD-10-CM | POA: Diagnosis not present

## 2022-01-11 DIAGNOSIS — R110 Nausea: Secondary | ICD-10-CM | POA: Diagnosis not present

## 2022-01-12 ENCOUNTER — Ambulatory Visit
Admission: RE | Admit: 2022-01-12 | Discharge: 2022-01-12 | Disposition: A | Discharge status: Home / Self Care | Payer: No Typology Code available for payment source | Source: Ambulatory Visit | Attending: Internal Medicine | Admitting: Internal Medicine

## 2022-01-12 DIAGNOSIS — Z1231 Encounter for screening mammogram for malignant neoplasm of breast: Secondary | ICD-10-CM | POA: Diagnosis not present

## 2022-01-13 ENCOUNTER — Emergency Department: Payer: No Typology Code available for payment source

## 2022-01-13 ENCOUNTER — Other Ambulatory Visit: Payer: Self-pay

## 2022-01-13 ENCOUNTER — Encounter: Payer: Self-pay | Admitting: Medical Oncology

## 2022-01-13 ENCOUNTER — Emergency Department
Admission: EM | Admit: 2022-01-13 | Discharge: 2022-01-13 | Disposition: A | Discharge status: Home / Self Care | Payer: No Typology Code available for payment source | Attending: Emergency Medicine | Admitting: Emergency Medicine

## 2022-01-13 DIAGNOSIS — R10811 Right upper quadrant abdominal tenderness: Secondary | ICD-10-CM | POA: Diagnosis not present

## 2022-01-13 DIAGNOSIS — R8289 Other abnormal findings on cytological and histological examination of urine: Secondary | ICD-10-CM | POA: Diagnosis not present

## 2022-01-13 DIAGNOSIS — I10 Essential (primary) hypertension: Secondary | ICD-10-CM | POA: Insufficient documentation

## 2022-01-13 DIAGNOSIS — R110 Nausea: Secondary | ICD-10-CM | POA: Diagnosis not present

## 2022-01-13 DIAGNOSIS — Z853 Personal history of malignant neoplasm of breast: Secondary | ICD-10-CM | POA: Insufficient documentation

## 2022-01-13 DIAGNOSIS — N3090 Cystitis, unspecified without hematuria: Secondary | ICD-10-CM | POA: Diagnosis not present

## 2022-01-13 DIAGNOSIS — R9082 White matter disease, unspecified: Secondary | ICD-10-CM | POA: Diagnosis not present

## 2022-01-13 DIAGNOSIS — E871 Hypo-osmolality and hyponatremia: Secondary | ICD-10-CM | POA: Diagnosis not present

## 2022-01-13 DIAGNOSIS — R519 Headache, unspecified: Secondary | ICD-10-CM | POA: Diagnosis not present

## 2022-01-13 DIAGNOSIS — I6782 Cerebral ischemia: Secondary | ICD-10-CM | POA: Diagnosis not present

## 2022-01-13 DIAGNOSIS — R1011 Right upper quadrant pain: Secondary | ICD-10-CM | POA: Diagnosis present

## 2022-01-13 DIAGNOSIS — R112 Nausea with vomiting, unspecified: Secondary | ICD-10-CM | POA: Diagnosis not present

## 2022-01-13 DIAGNOSIS — G319 Degenerative disease of nervous system, unspecified: Secondary | ICD-10-CM | POA: Diagnosis not present

## 2022-01-13 LAB — URINALYSIS, ROUTINE W REFLEX MICROSCOPIC
Bilirubin Urine: NEGATIVE
Glucose, UA: NEGATIVE mg/dL
Hgb urine dipstick: NEGATIVE
Ketones, ur: 5 mg/dL — AB
Nitrite: NEGATIVE
Protein, ur: NEGATIVE mg/dL
Specific Gravity, Urine: 1.009 (ref 1.005–1.030)
pH: 5 (ref 5.0–8.0)

## 2022-01-13 LAB — CBC
HCT: 37.6 % (ref 36.0–46.0)
Hemoglobin: 12.7 g/dL (ref 12.0–15.0)
MCH: 29.7 pg (ref 26.0–34.0)
MCHC: 33.8 g/dL (ref 30.0–36.0)
MCV: 88.1 fL (ref 80.0–100.0)
Platelets: 365 10*3/uL (ref 150–400)
RBC: 4.27 MIL/uL (ref 3.87–5.11)
RDW: 13.9 % (ref 11.5–15.5)
WBC: 6 10*3/uL (ref 4.0–10.5)
nRBC: 0 % (ref 0.0–0.2)

## 2022-01-13 LAB — COMPREHENSIVE METABOLIC PANEL
ALT: 17 U/L (ref 0–44)
AST: 21 U/L (ref 15–41)
Albumin: 4.2 g/dL (ref 3.5–5.0)
Alkaline Phosphatase: 72 U/L (ref 38–126)
Anion gap: 10 (ref 5–15)
BUN: 13 mg/dL (ref 8–23)
CO2: 26 mmol/L (ref 22–32)
Calcium: 9.3 mg/dL (ref 8.9–10.3)
Chloride: 97 mmol/L — ABNORMAL LOW (ref 98–111)
Creatinine, Ser: 0.78 mg/dL (ref 0.44–1.00)
GFR, Estimated: >60 mL/min (ref >60)
Glucose, Bld: 107 mg/dL — ABNORMAL HIGH (ref 70–99)
Potassium: 4.3 mmol/L (ref 3.5–5.1)
Sodium: 133 mmol/L — ABNORMAL LOW (ref 135–145)
Total Bilirubin: 0.8 mg/dL (ref 0.3–1.2)
Total Protein: 7.2 g/dL (ref 6.5–8.1)

## 2022-01-13 LAB — LIPASE, BLOOD: Lipase: 35 U/L (ref 11–51)

## 2022-01-13 MED ORDER — FOSFOMYCIN TROMETHAMINE 3 G PO PACK
3.0000 g | PACK | ORAL | Status: AC
  Administered 2022-01-13: 3 g via ORAL
  Filled 2022-01-13: qty 3

## 2022-01-13 MED ORDER — SODIUM CHLORIDE 0.9 % IV BOLUS
500.0000 mL | Freq: Once | INTRAVENOUS | Status: AC
  Administered 2022-01-13: 500 mL via INTRAVENOUS

## 2022-01-13 MED ORDER — ONDANSETRON HCL 4 MG/2ML IJ SOLN
4.0000 mg | INTRAMUSCULAR | Status: AC
  Administered 2022-01-13: 4 mg via INTRAVENOUS
  Filled 2022-01-13: qty 2

## 2022-01-13 MED ORDER — IOHEXOL 300 MG/ML  SOLN
75.0000 mL | Freq: Once | INTRAMUSCULAR | Status: AC | PRN
  Administered 2022-01-13: 75 mL via INTRAVENOUS

## 2022-01-13 NOTE — ED Triage Notes (Signed)
Pt reports that she has been having nausea for about a month with occasional vomiting but nothing consistent also reports that she has been having RUQ abd tenderness. Denies fever or diarrhea.

## 2022-01-13 NOTE — ED Notes (Signed)
Pt verbalized understanding of discharge instructions and follow-up care instructions. Pt advised if symptoms worsen to return to ED. E-signature not available due to signature pad not working.

## 2022-01-13 NOTE — ED Provider Notes (Signed)
[facility] Provider Note    Event Date/Time   First MD Initiated Contact with Patient 01/13/22 1401     (approximate)   History   Nausea   HPI  [name] [name] is a 81 y.o. female   history of distant breast cancer, urinary tract infections with 1 recent, hyperlipidemia hypertension  Patient has been experiencing nausea now for about 3 to 4 weeks.  The cause is unclear.  At times she will have a little bit of discomfort in her abdomen but she reports primarily just having nausea.  She saw her doctor recently at [facility] and has been taking Zofran which is helpful, but she relates that she does continue to have an unremitting feeling of nausea.  Occasionally she will have very mild headache  No chest pain or trouble breathing.  She has lost about 10 pounds in the last month.  She and her daughter both very concerned about her symptoms.  Occasionally she will have off-and-on discomfort in her right upper abdomen but it does not especially seem to be related to the nausea.  She is also taking a acid reducer and nausea medicine and was referred to GI by her primary care but they cannot see her until October at the [place] clinic        Physical Exam   Triage Vital Signs: ED Triage Vitals  Enc Vitals Group     BP 01/13/22 1047 134/88     Pulse Rate 01/13/22 1047 88     Resp 01/13/22 1047 16     Temp 01/13/22 1047 97.6 F (36.4 C)     Temp Source 01/13/22 1047 Oral     SpO2 01/13/22 1047 98 %     Weight 01/13/22 1417 119 lb 0.8 oz (54 kg)     Height 01/13/22 1417 '5\' 1"'$  (1.549 m)     Head Circumference --      Peak Flow --      Pain Score 01/13/22 1055 0     Pain Loc --      Pain Edu? --      Excl. in [place]? --     Most recent vital signs: Vitals:   01/13/22 1724 01/13/22 1820  BP: (!) 168/83   Pulse: 81   Resp: 16   Temp: 97.8 F (36.6 C) 98.1 F (36.7 C)  SpO2: 97%      General: Awake, no distress.  She is very pleasant  what CV:  Good peripheral perfusion.  Normal heart tones and rate Resp:  Normal effort.  Clear bilateral Abd:  No distention.  No focal abdominal pain, currently no associated abdominal pain.  She reports she will intermittently experience some discomfort often in the right flank or right upper quadrant area presently not having pain but has some ongoing nausea.  No appreciable tenderness to CVA percussion Other:  Normal peripheral perfusion moves all extremities well.  Normal facial expressions and extraocular movements.  She reports occasionally will have headaches currently not active.   ED Results / Procedures / Treatments   Labs (all labs ordered are listed, but only abnormal results are displayed) Labs Reviewed  COMPREHENSIVE METABOLIC PANEL - Abnormal; Notable for the following components:      Result Value   Sodium 133 (*)    Chloride 97 (*)    Glucose, Bld 107 (*)    All other components within normal limits  URINALYSIS, ROUTINE W REFLEX MICROSCOPIC - Abnormal; Notable for  the following components:   Color, Urine YELLOW (*)    APPearance CLEAR (*)    Ketones, ur 5 (*)    Leukocytes,Ua MODERATE (*)    Bacteria, UA RARE (*)    All other components within normal limits  URINE CULTURE  LIPASE, BLOOD  CBC     EKG  Is reviewed and interpreted by me at 11 AM heart rate 80 QRS 80 QTc 420 normal sinus rhythm no evidence of ischemia   RADIOLOGY   CT head ordered to evaluate for gross abnormality such as mass lesion or elevated evidence of intracranial pressure given the unremitting nausea though likely suspect this is abdominal in etiology but wish to exclude gross central nervous pathology  CT ABDOMEN PELVIS W CONTRAST  Result Date: 01/13/2022 CLINICAL DATA:  Nausea and occasional vomiting for 1 month, right upper quadrant abdominal tenderness EXAM: CT ABDOMEN AND PELVIS WITH CONTRAST TECHNIQUE: Multidetector CT imaging of the abdomen and pelvis was performed using the  standard protocol following bolus administration of intravenous contrast. RADIATION DOSE REDUCTION: This exam was performed according to the departmental dose-optimization program which includes automated exposure control, adjustment of the mA and/or kV according to patient size and/or use of iterative reconstruction technique. CONTRAST:  57m OMNIPAQUE IOHEXOL 300 MG/ML  SOLN COMPARISON:  09/26/2007 FINDINGS: Lower chest: No acute pleural or parenchymal lung disease. Hepatobiliary: No focal liver abnormality is seen. No gallstones, gallbladder wall thickening, or biliary dilatation. Pancreas: 1.1 cm cystic area within the uncinate process of the pancreas is unchanged since prior exam, representing benign etiology given long-term stability. Otherwise the pancreas is unremarkable with no inflammatory change. Spleen: Normal in size without focal abnormality. Adrenals/Urinary Tract: Kidneys enhance normally and symmetrically. Adrenals are unremarkable. The bladder is decompressed, limiting its evaluation. Stomach/Bowel: No bowel obstruction or ileus. Moderate retained stool throughout the colon. The appendix, if still present, is not well visualized. No bowel wall thickening or inflammatory change. Vascular/Lymphatic: Aortic atherosclerosis. No enlarged abdominal or pelvic lymph nodes. Reproductive: Status post hysterectomy. No adnexal masses. Other: No free fluid or free intraperitoneal gas. No abdominal wall hernia. Musculoskeletal: No acute or destructive bony lesions. Reconstructed images demonstrate no additional findings. IMPRESSION: 1. Moderate retained stool throughout the colon consistent with constipation. No obstruction or ileus. 2. No acute intra-abdominal or intrapelvic process. 3.  Aortic Atherosclerosis (ICD10-I70.0). Electronically Signed   By: MRanda NgoM.D.   On: 01/13/2022 16:43   CT Head Wo Contrast  Result Date: 01/13/2022 CLINICAL DATA:  Nausea over the last month with vomiting. EXAM: CT  HEAD WITHOUT CONTRAST TECHNIQUE: Contiguous axial images were obtained from the base of the skull through the vertex without intravenous contrast. RADIATION DOSE REDUCTION: This exam was performed according to the departmental dose-optimization program which includes automated exposure control, adjustment of the mA and/or kV according to patient size and/or use of iterative reconstruction technique. COMPARISON:  12/18/2021 FINDINGS: Brain: Periventricular white matter and corona radiata hypodensities favor chronic ischemic microvascular white matter disease. Dilated perivascular space along the inferior margin of the left lentiform nucleus. Stable mild bifrontal atrophy. Otherwise, the brainstem, cerebellum, cerebral peduncles, thalamus, basal ganglia, basilar cisterns, and ventricular system appear within normal limits. No hydrocephalus. No intracranial hemorrhage, mass lesion, or acute CVA. Vascular: Unremarkable Skull: Unremarkable Sinuses/Orbits: Unremarkable Other: No supplemental non-categorized findings. IMPRESSION: 1. No specific intracranial abnormality to explain the patient's nausea. No hydrocephalus is observed. 2. Periventricular white matter and corona radiata hypodensities favor chronic ischemic microvascular white matter disease. Stable bifrontal  atrophy. Electronically Signed   By: Van Clines M.D.   On: 01/13/2022 16:42      PROCEDURES:  Critical Care performed: No  Procedures   MEDICATIONS ORDERED IN ED: Medications  sodium chloride 0.9 % bolus 500 mL (0 mLs Intravenous Stopped 01/13/22 1726)  ondansetron (ZOFRAN) injection 4 mg (4 mg Intravenous Given 01/13/22 1644)  fosfomycin (MONUROL) packet 3 g (3 g Oral Given 01/13/22 1646)  iohexol (OMNIPAQUE) 300 MG/ML solution 75 mL (75 mLs Intravenous Contrast Given 01/13/22 1625)  sodium chloride 0.9 % bolus 500 mL (0 mLs Intravenous Stopped 01/13/22 1819)     IMPRESSION / MDM / ASSESSMENT AND PLAN / ED COURSE  I reviewed the  triage vital signs and the nursing notes.                              Differential diagnosis includes but is not limited to, abdominal perforation, aortic dissection, cholecystitis, appendicitis, diverticulitis, colitis, esophagitis/gastritis, kidney stone, pyelonephritis, urinary tract infection, aortic aneurysm. All are considered in decision and treatment plan. Based upon the patient's presentation and risk factors, and also the nature of her symptoms for 3 weeks now with unknown cause for nausea we will proceed with imaging including CT abdomen pelvis as well as CT scan of the head.  Labs here appear quite reassuring except she may have a mild evidence of cystitis without clinical evidence of pyelonephritis   Patient's presentation is most consistent with acute complicated illness / injury requiring diagnostic workup.   Work-up here including laboratory studies reassuring.  Very mild hyponatremia is likely secondary to element of dehydration.  Clinical Course as of 01/13/22 2033  Fri Jan 13, 2022  1633 Personally interpreted CT scan of the head, no evidence of acute space-occupying lesion noted on my gross interpretation [MQ]  1633 Sodium(!): 133 Suspect likely secondary to mild dehydration, will rehydrate. [MQ]  1633 Chalmers Guest): MODERATE [MQ]  1633 Bacteria, UA(!): RARE Treat with fosfomycin single dose.  No clear findings to suggest pyelonephritis present.  Suggest cystitis [MQ]    Clinical Course User Index [MQ] Delman Kitten, MD   The imaging studies including CT of the head abdomen pelvis reassuring.  Suspect there may be some nausea secondary to mild cystitis which has been treated in the ED.  She will follow-up closely with Dr. Edwina Barth.  Discussed Return precautions, she has adequate Zofran at home.  Discussed with daughter as well plan for follow-up, also placed urgent request for GI follow-up as well for further work-up I suspect is some sort of this GI associated symptoms  such as gastritis GERD reflux etc.    FINAL CLINICAL IMPRESSION(S) / ED DIAGNOSES   Final diagnoses:  Nausea  Cystitis     Rx / DC Orders   ED Discharge Orders          Ordered    Ambulatory referral to Gastroenterology       Comments: Nausea (PCP referred but [facility] reported several months wait)   01/13/22 1503             Note:  This document was prepared using Dragon voice recognition software and may include unintentional dictation errors.   Delman Kitten, MD 01/13/22 2034

## 2022-01-13 NOTE — Discharge Instructions (Signed)
Please continue to follow closely with Dr. Edwina Barth.  Additionally you were given a single dose of antibiotic that should treat what appears to be another but perhaps mild urinary tract infection.  Please return to the emergency room right away if you are to develop a fever, severe nausea, your pain becomes severe or worsens, you are unable to keep food down, begin vomiting any dark or bloody fluid, you develop any dark or bloody stools, feel dehydrated, or other new concerns or symptoms arise.  Also, please continue to plan follow-up with gastroenterology.

## 2022-01-14 LAB — URINE CULTURE: Culture: <10000 — AB

## 2022-01-18 ENCOUNTER — Emergency Department
Admission: EM | Admit: 2022-01-18 | Discharge: 2022-01-18 | Disposition: A | Discharge status: Home / Self Care | Payer: No Typology Code available for payment source | Attending: Emergency Medicine | Admitting: Emergency Medicine

## 2022-01-18 ENCOUNTER — Emergency Department: Payer: No Typology Code available for payment source

## 2022-01-18 ENCOUNTER — Encounter: Payer: Self-pay | Admitting: Emergency Medicine

## 2022-01-18 ENCOUNTER — Other Ambulatory Visit: Payer: Self-pay

## 2022-01-18 DIAGNOSIS — R0789 Other chest pain: Secondary | ICD-10-CM | POA: Insufficient documentation

## 2022-01-18 DIAGNOSIS — R1010 Upper abdominal pain, unspecified: Secondary | ICD-10-CM | POA: Diagnosis not present

## 2022-01-18 DIAGNOSIS — E871 Hypo-osmolality and hyponatremia: Secondary | ICD-10-CM | POA: Diagnosis not present

## 2022-01-18 DIAGNOSIS — Z85828 Personal history of other malignant neoplasm of skin: Secondary | ICD-10-CM | POA: Diagnosis not present

## 2022-01-18 DIAGNOSIS — K219 Gastro-esophageal reflux disease without esophagitis: Secondary | ICD-10-CM | POA: Insufficient documentation

## 2022-01-18 DIAGNOSIS — Z853 Personal history of malignant neoplasm of breast: Secondary | ICD-10-CM | POA: Diagnosis not present

## 2022-01-18 DIAGNOSIS — Z79899 Other long term (current) drug therapy: Secondary | ICD-10-CM | POA: Diagnosis not present

## 2022-01-18 DIAGNOSIS — I10 Essential (primary) hypertension: Secondary | ICD-10-CM | POA: Insufficient documentation

## 2022-01-18 DIAGNOSIS — R52 Pain, unspecified: Secondary | ICD-10-CM | POA: Diagnosis not present

## 2022-01-18 DIAGNOSIS — R079 Chest pain, unspecified: Secondary | ICD-10-CM | POA: Diagnosis not present

## 2022-01-18 DIAGNOSIS — R110 Nausea: Secondary | ICD-10-CM | POA: Diagnosis not present

## 2022-01-18 DIAGNOSIS — K8020 Calculus of gallbladder without cholecystitis without obstruction: Secondary | ICD-10-CM | POA: Insufficient documentation

## 2022-01-18 DIAGNOSIS — Z7982 Long term (current) use of aspirin: Secondary | ICD-10-CM | POA: Insufficient documentation

## 2022-01-18 LAB — LIPASE, BLOOD: Lipase: 37 U/L (ref 11–51)

## 2022-01-18 LAB — COMPREHENSIVE METABOLIC PANEL
ALT: 14 U/L (ref 0–44)
AST: 24 U/L (ref 15–41)
Albumin: 4.3 g/dL (ref 3.5–5.0)
Alkaline Phosphatase: 64 U/L (ref 38–126)
Anion gap: 8 (ref 5–15)
BUN: 7 mg/dL — ABNORMAL LOW (ref 8–23)
CO2: 25 mmol/L (ref 22–32)
Calcium: 9.2 mg/dL (ref 8.9–10.3)
Chloride: 94 mmol/L — ABNORMAL LOW (ref 98–111)
Creatinine, Ser: 0.6 mg/dL (ref 0.44–1.00)
GFR, Estimated: >60 mL/min (ref >60)
Glucose, Bld: 115 mg/dL — ABNORMAL HIGH (ref 70–99)
Potassium: 4 mmol/L (ref 3.5–5.1)
Sodium: 127 mmol/L — ABNORMAL LOW (ref 135–145)
Total Bilirubin: 1.1 mg/dL (ref 0.3–1.2)
Total Protein: 7.2 g/dL (ref 6.5–8.1)

## 2022-01-18 LAB — CBC WITH DIFFERENTIAL/PLATELET
Abs Immature Granulocytes: 0.02 10*3/uL (ref 0.00–0.07)
Basophils Absolute: 0 10*3/uL (ref 0.0–0.1)
Basophils Relative: 1 %
Eosinophils Absolute: 0.2 10*3/uL (ref 0.0–0.5)
Eosinophils Relative: 2 %
HCT: 35.3 % — ABNORMAL LOW (ref 36.0–46.0)
Hemoglobin: 12.2 g/dL (ref 12.0–15.0)
Immature Granulocytes: 0 %
Lymphocytes Relative: 20 %
Lymphs Abs: 1.5 10*3/uL (ref 0.7–4.0)
MCH: 29.3 pg (ref 26.0–34.0)
MCHC: 34.6 g/dL (ref 30.0–36.0)
MCV: 84.9 fL (ref 80.0–100.0)
Monocytes Absolute: 0.6 10*3/uL (ref 0.1–1.0)
Monocytes Relative: 8 %
Neutro Abs: 5 10*3/uL (ref 1.7–7.7)
Neutrophils Relative %: 69 %
Platelets: 339 10*3/uL (ref 150–400)
RBC: 4.16 MIL/uL (ref 3.87–5.11)
RDW: 13.3 % (ref 11.5–15.5)
WBC: 7.3 10*3/uL (ref 4.0–10.5)
nRBC: 0 % (ref 0.0–0.2)

## 2022-01-18 LAB — D-DIMER, QUANTITATIVE: D-Dimer, Quant: 0.39 ug/mL-FEU (ref 0.00–0.50)

## 2022-01-18 LAB — TROPONIN I (HIGH SENSITIVITY)
Troponin I (High Sensitivity): 4 ng/L (ref <18)
Troponin I (High Sensitivity): 4 ng/L (ref <18)

## 2022-01-18 MED ORDER — ONDANSETRON HCL 4 MG/2ML IJ SOLN
4.0000 mg | Freq: Once | INTRAMUSCULAR | Status: AC
  Administered 2022-01-18: 4 mg via INTRAVENOUS
  Filled 2022-01-18: qty 2

## 2022-01-18 MED ORDER — SUCRALFATE 1 G PO TABS: 1.0000 g | ORAL_TABLET | Freq: Four times a day (QID) | ORAL | 1 refills | Status: AC

## 2022-01-18 MED ORDER — PANTOPRAZOLE SODIUM 40 MG PO TBEC
40.0000 mg | DELAYED_RELEASE_TABLET | Freq: Once | ORAL | Status: AC
  Administered 2022-01-18: 40 mg via ORAL
  Filled 2022-01-18: qty 1

## 2022-01-18 MED ORDER — SODIUM CHLORIDE 0.9 % IV BOLUS (SEPSIS)
1000.0000 mL | Freq: Once | INTRAVENOUS | Status: AC
  Administered 2022-01-18: 1000 mL via INTRAVENOUS

## 2022-01-18 MED ORDER — HYDROCODONE-ACETAMINOPHEN 5-325 MG PO TABS
1.0000 | ORAL_TABLET | Freq: Once | ORAL | Status: AC
  Administered 2022-01-18: 1 via ORAL
  Filled 2022-01-18: qty 1

## 2022-01-18 MED ORDER — PANTOPRAZOLE SODIUM 40 MG PO TBEC: 40.0000 mg | DELAYED_RELEASE_TABLET | Freq: Every day | ORAL | 1 refills | Status: DC

## 2022-01-18 MED ORDER — ACETAMINOPHEN 500 MG PO TABS
1000.0000 mg | ORAL_TABLET | Freq: Once | ORAL | Status: AC
Start: 2022-01-18 — End: 2022-01-18
  Administered 2022-01-18: 1000 mg via ORAL
  Filled 2022-01-18: qty 2

## 2022-01-18 MED ORDER — ONDANSETRON 4 MG PO TBDP: 4.0000 mg | ORAL_TABLET | Freq: Four times a day (QID) | ORAL | 0 refills | Status: DC | PRN

## 2022-01-18 MED ORDER — ALUM & MAG HYDROXIDE-SIMETH 200-200-20 MG/5ML PO SUSP
30.0000 mL | Freq: Once | ORAL | Status: AC
  Administered 2022-01-18: 30 mL via ORAL
  Filled 2022-01-18: qty 30

## 2022-01-18 NOTE — ED Notes (Signed)
Pt's pain did improve after GI cocktail meds

## 2022-01-18 NOTE — Discharge Instructions (Addendum)
Your sodium level today was low at 127.  I recommend she follow-up with your primary care doctor in 1 week to have this rechecked.   Please avoid NSAIDs such as aspirin (Goody powders), ibuprofen (Motrin, Advil), naproxen (Aleve) as these may worsen your symptoms.  Tylenol 1000 mg every 6 hours is safe to take as long as you have no history of liver problems (heavy alcohol use, cirrhosis, hepatitis).  Please avoid spicy, acidic (citrus fruits, tomato based sauces, salsa), greasy, fatty foods.  Please avoid caffeine and alcohol.  Smoking can also make GERD/acid reflux worse.  Over the counter medications such as TUMS, Maalox or Mylanta, pepcid, Prilosec or Nexium may help with your symptoms.  Do not take Prilosec or Nexium if you are already prescribed a proton pump inhibitor.

## 2022-01-18 NOTE — ED Provider Notes (Signed)
[facility] Provider Note    Event Date/Time   First MD Initiated Contact with Patient 01/18/22 0319     (approximate)   History   Chest Pain   HPI  [name] [name] is a 81 y.o. female with history of hypertension, hyperlipidemia who presents to the emergency department with central lower chest pressure, pain worse with inspiration, shortness of breath, nausea that started tonight around 10 PM.  No other aggravating or alleviating factors.  Has had previous DVT in the lower extremity with pregnancy.  Status post hysterectomy, appendectomy.  Does have some upper abdominal tenderness to palpation but no pain in the abdomen otherwise.  Denies any recent vomiting, diarrhea, bloody stool, melena.  No fever.   History provided by patient and daughter.    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   Trigger finger     Past Surgical History:  Procedure Laterality Date   ABDOMINAL HYSTERECTOMY     APPENDECTOMY  1954   BREAST BIOPSY Left 1986   neg   BREAST BIOPSY Left 1988   neg   BREAST BIOPSY Left 1995   neg   BREAST BIOPSY Left 2009   positive (ultrasound guided biopsy)   BREAST LUMPECTOMY     BROW LIFT  02/18/2021   Procedure: BLEPHAROPTOSIS REPAIR; RESECT EX BILATERAL;  Surgeon: Karle Starch, MD;  Location: [place];  Service:  Ophthalmology;;   COLONOSCOPY  07/31/2013   COLONOSCOPY WITH PROPOFOL N/A 01/10/2016   Procedure: COLONOSCOPY WITH PROPOFOL;  Surgeon: Manya Silvas, MD;  Location: [facility] ENDOSCOPY;  Service: Endoscopy;  Laterality: N/A;   ESOPHAGOGASTRODUODENOSCOPY  1/292015   ESOPHAGOGASTRODUODENOSCOPY N/A 01/09/2016   Procedure: ESOPHAGOGASTRODUODENOSCOPY (EGD);  Surgeon: Manya Silvas, MD;  Location: [facility] ENDOSCOPY;  Service: Endoscopy;  Laterality: N/A;   eye lid lift     [place]   WRIST SURGERY  2009, 2006   ganglion cysts removal    MEDICATIONS:  Prior to Admission medications   Medication Sig Start Date End Date Taking? Authorizing Provider  aspirin EC 81 MG tablet Take 81 mg by mouth.    [provider]  diphenhydrAMINE (BENADRYL) 25 mg capsule Take 25 mg by mouth at bedtime.     [provider]  erythromycin ophthalmic ointment Apply to sutures 4 times a day for 10-12 days.  Discontinue if allergy develops and call our office 02/18/21   Karle Starch, MD  gabapentin (NEURONTIN) 300 MG capsule Take 300 mg by mouth 2 (two) times daily.  07/16/15   [provider]  imiquimod Leroy Sea) 5 % cream Apply to affected areas three times weekly for 2 months. 06/15/20   Moye, [place], MD  losartan (COZAAR) 25 MG tablet Take 25 mg by  mouth daily.    [provider]  pantoprazole (PROTONIX) 40 MG tablet Take 1 tablet (40 mg total) by mouth daily. 01/10/16   Bettey Costa, MD  pravastatin (PRAVACHOL) 40 MG tablet Take 40 mg by mouth at bedtime.  06/09/15   [provider]  traMADol Veatrice Bourbon) 50 MG tablet Take 1 every 4-6 hours as needed for pain not controlled by Tylenol 02/18/21   Karle Starch, MD    Physical Exam   Triage Vital Signs: ED Triage Vitals [01/18/22 0306]  Enc Vitals Group     BP (!) 154/86     Pulse Rate 80     Resp 18     Temp 98.1 F (36.7 C)     Temp Source Oral     SpO2 98 %     Weight      Height       Head Circumference      Peak Flow      Pain Score 8     Pain Loc      Pain Edu?      Excl. in [place]?     Most recent vital signs: Vitals:   01/18/22 0509 01/18/22 0530  BP: (!) 158/83 (!) 170/86  Pulse: 76 85  Resp:  20  Temp:    SpO2: 98% 98%    CONSTITUTIONAL: Alert and oriented and responds appropriately to questions. Well-appearing; well-nourished HEAD: Normocephalic, atraumatic EYES: Conjunctivae clear, pupils appear equal, sclera nonicteric ENT: normal nose; moist mucous membranes NECK: Supple, normal ROM CARD: RRR; S1 and S2 appreciated; no murmurs, no clicks, no rubs, no gallops RESP: Normal chest excursion without splinting or tachypnea; breath sounds clear and equal bilaterally; no wheezes, no rhonchi, no rales, no hypoxia or respiratory distress, speaking full sentences ABD/GI: Normal bowel sounds; non-distended; soft, tender to palpation in the upper abdomen diffusely without guarding or rebound, negative Murphy sign BACK: The back appears normal EXT: Normal ROM in all joints; no deformity noted, no edema; no cyanosis SKIN: Normal color for age and race; warm; no rash on exposed skin NEURO: Moves all extremities equally, normal speech PSYCH: The patient's mood and manner are appropriate.   ED Results / Procedures / Treatments   LABS: (all labs ordered are listed, but only abnormal results are displayed) Labs Reviewed  CBC WITH DIFFERENTIAL/PLATELET - Abnormal; Notable for the following components:      Result Value   HCT 35.3 (*)    All other components within normal limits  COMPREHENSIVE METABOLIC PANEL - Abnormal; Notable for the following components:   Sodium 127 (*)    Chloride 94 (*)    Glucose, Bld 115 (*)    BUN 7 (*)    All other components within normal limits  LIPASE, BLOOD  D-DIMER, QUANTITATIVE  TROPONIN I (HIGH SENSITIVITY)  TROPONIN I (HIGH SENSITIVITY)     EKG:  EKG Interpretation  Date/Time:  Wednesday January 18 2022 03:14:00  EDT Ventricular Rate:  76 PR Interval:    QRS Duration: 100 QT Interval:  400 QTC Calculation: 450 R Axis:   41 Text Interpretation: Normal sinus rhythm Borderline low voltage, extremity leads Confirmed by Pryor Curia [phone]) on 01/18/2022 3:29:45 AM         RADIOLOGY: My personal review and interpretation of imaging: Right upper quadrant ultrasound shows gallstones without cholecystitis.  Chest x-ray clear.  No widened mediastinum or cardiomegaly.  I have personally reviewed all radiology reports.   US ABDOMEN LIMITED RUQ (LIVER/GB)  Result Date: 01/18/2022 CLINICAL DATA:  Upper abdominal pain today EXAM: ULTRASOUND ABDOMEN LIMITED RIGHT UPPER QUADRANT COMPARISON:  Abdominal CT from 5 days ago FINDINGS: Gallbladder: Small echogenic gallstones layering in the gallbladder. No wall thickening or focal tenderness. Common bile duct: Diameter: 4 mm Liver: No focal lesion identified. Within normal limits in parenchymal echogenicity. Portal vein is patent on color Doppler imaging with normal direction of blood flow towards the liver. IMPRESSION: Tiny gallstones.  Negative for cholecystitis. Electronically Signed   By: Jorje Guild M.D.   On: 01/18/2022 05:23   DG Chest Portable 1 View  Result Date: 01/18/2022 CLINICAL DATA:  Chest pain EXAM: PORTABLE CHEST 1 VIEW COMPARISON:  None Available. FINDINGS: The heart size and mediastinal contours are within normal limits. Both lungs are clear. The visualized skeletal structures are unremarkable. IMPRESSION: No active disease. Electronically Signed   By: Fidela Salisbury M.D.   On: 01/18/2022 03:30     PROCEDURES:  Critical Care performed: No     .1-3 Lead EKG Interpretation  Performed by: Elizabelle Fite, Delice Bison, DO Authorized by: Kim Oki, Delice Bison, DO     Interpretation: normal     ECG rate:  80   ECG rate assessment: normal     Rhythm: sinus rhythm     Ectopy: none     Conduction: normal       IMPRESSION / MDM / ASSESSMENT AND PLAN / ED  COURSE  I reviewed the triage vital signs and the nursing notes.    Patient here with complaints of chest pain.  She does have some upper abdominal tenderness on exam as well.  The patient is on the cardiac monitor to evaluate for evidence of arrhythmia and/or significant heart rate changes.   DIFFERENTIAL DIAGNOSIS (includes but not limited to):   ACS, PE, dissection, gastritis, GERD, esophagitis, cholelithiasis, cholecystitis, pancreatitis   Patient's presentation is most consistent with acute presentation with potential threat to life or bodily function.   PLAN: We will obtain CBC, CMP, lipase, troponin x2, D-dimer, chest x-ray, right upper quadrant ultrasound.  EKG is nonischemic.  Will give GI cocktail, Protonix and Zofran for symptomatic relief.   MEDICATIONS GIVEN IN ED: Medications  HYDROcodone-acetaminophen (NORCO/VICODIN) 5-325 MG per tablet 1 tablet (has no administration in time range)  alum & mag hydroxide-simeth (MAALOX/MYLANTA) 200-200-20 MG/5ML suspension 30 mL (30 mLs Oral Given 01/18/22 0350)  ondansetron (ZOFRAN) injection 4 mg (4 mg Intravenous Given 01/18/22 0350)  pantoprazole (PROTONIX) EC tablet 40 mg (40 mg Oral Given 01/18/22 0350)  sodium chloride 0.9 % bolus 1,000 mL (0 mLs Intravenous Stopped 01/18/22 0550)  acetaminophen (TYLENOL) tablet 1,000 mg (1,000 mg Oral Given 01/18/22 0506)     ED COURSE: Patient's labs show no leukocytosis.  She has mild hyponatremia with a sodium of 127.  We will give IV hydration.  LFTs, lipase normal.  First troponin negative.  Chest x-ray reviewed/interpreted by myself radiologist and shows no infiltrate, edema or pneumothorax.  Patient's second troponin is negative.  D-dimer is negative.  Right upper quadrant ultrasound reviewed/interpreted by myself radiologist and shows gallstones without cholecystitis or choledocholithiasis.  She reports feeling better after Protonix, GI cocktail and IV fluids but is complaining of posterior  headache and neck pain from the stretcher.  No head injury.  Normal movement of all extremities, speech, no facial asymmetry and ambulates without difficulty.  Given Tylenol without much relief.  She was requesting for something stronger prior to discharge.  Will give one-time dose of Vicodin.  Low suspicion for intracranial hemorrhage, stroke, dissection.  Do not feel she needs imaging of her head at this time.  Patient and daughter comfortable with this plan.  I suspect that symptoms are secondary to GERD.  She states that she is only taking simethicone at home.  Will discharge with Protonix, Carafate, Zofran.  She has GI follow-up scheduled in October.   At this time, I do not feel there is any life-threatening condition present. I reviewed all nursing notes, vitals, pertinent previous records.  All lab and urine results, EKGs, imaging ordered have been independently reviewed and interpreted by myself.  I reviewed all available radiology reports from any imaging ordered this visit.  Based on my assessment, I feel the patient is safe to be discharged home without further emergent workup and can continue workup as an outpatient as needed. Discussed all findings, treatment plan as well as usual and customary return precautions.  They verbalize understanding and are comfortable with this plan.  Outpatient follow-up has been provided as needed.  All questions have been answered.    CONSULTS: Admission considered but given symptoms seem atypical for ACS and she has had 2 negative troponins and a negative D-dimer and pain improved with GI cocktail, will discharge home for further outpatient management.   OUTSIDE RECORDS REVIEWED: Reviewed patient's last office visit with Dr. Harrel Lemon with internal medicine on 01/11/2022 for GERD.       FINAL CLINICAL IMPRESSION(S) / ED DIAGNOSES   Final diagnoses:  Atypical chest pain  Hyponatremia  Gastroesophageal reflux disease without esophagitis   Gallstones     Rx / DC Orders   ED Discharge Orders          Ordered    pantoprazole (PROTONIX) 40 MG tablet  Daily        01/18/22 0559    ondansetron (ZOFRAN-ODT) 4 MG disintegrating tablet  Every 6 hours PRN        01/18/22 0559    sucralfate (CARAFATE) 1 g tablet  4 times daily        01/18/22 0559             Note:  This document was prepared using Dragon voice recognition software and may include unintentional dictation errors.   Jeyren Danowski, Delice Bison, DO 01/18/22 [phone]

## 2022-01-18 NOTE — ED Triage Notes (Signed)
Pt presents to ER from home via ems c/o chest pain.  Pt states she felt like she had to burp a lot prior to going to sleep yesterday PM.  Pt states she was awoken tonight around 0100 with central chest pain that is non-radiating.  Pt describes pain as a pressure.  Pt denies hx of heart problems.  Pt is A&O x4 at this time in NAD in triage.

## 2022-01-18 NOTE — ED Notes (Signed)
Chest pain is worse with inspiration. She reports she has had trouble sleeping past few nights due to not feeling like she could belch when needed to. Husband died when 25 of MI and parents both had heart disease. Pt appears anxious.

## 2022-01-19 DIAGNOSIS — K5909 Other constipation: Secondary | ICD-10-CM | POA: Diagnosis not present

## 2022-01-19 DIAGNOSIS — K219 Gastro-esophageal reflux disease without esophagitis: Secondary | ICD-10-CM | POA: Diagnosis not present

## 2022-01-19 DIAGNOSIS — R1011 Right upper quadrant pain: Secondary | ICD-10-CM | POA: Diagnosis not present

## 2022-01-19 DIAGNOSIS — R110 Nausea: Secondary | ICD-10-CM | POA: Diagnosis not present

## 2022-01-19 DIAGNOSIS — K8020 Calculus of gallbladder without cholecystitis without obstruction: Secondary | ICD-10-CM | POA: Diagnosis not present

## 2022-01-23 ENCOUNTER — Encounter: Payer: Self-pay | Admitting: Surgery

## 2022-01-23 ENCOUNTER — Emergency Department
Admission: EM | Admit: 2022-01-23 | Discharge: 2022-01-23 | Discharge status: Against Medical Advice | Payer: No Typology Code available for payment source | Source: Home / Self Care

## 2022-01-23 ENCOUNTER — Other Ambulatory Visit: Payer: Self-pay

## 2022-01-23 ENCOUNTER — Ambulatory Visit (INDEPENDENT_AMBULATORY_CARE_PROVIDER_SITE_OTHER): Payer: No Typology Code available for payment source | Admitting: Surgery

## 2022-01-23 VITALS — BP 141/85 | HR 98 | Temp 98.0°F | Ht 61.0 in | Wt 108.4 lb

## 2022-01-23 DIAGNOSIS — I10 Essential (primary) hypertension: Secondary | ICD-10-CM | POA: Diagnosis not present

## 2022-01-23 DIAGNOSIS — R634 Abnormal weight loss: Secondary | ICD-10-CM | POA: Diagnosis not present

## 2022-01-23 DIAGNOSIS — R6251 Failure to thrive (child): Secondary | ICD-10-CM

## 2022-01-23 DIAGNOSIS — Z882 Allergy status to sulfonamides status: Secondary | ICD-10-CM | POA: Diagnosis not present

## 2022-01-23 DIAGNOSIS — E871 Hypo-osmolality and hyponatremia: Secondary | ICD-10-CM | POA: Diagnosis not present

## 2022-01-23 DIAGNOSIS — I6300 Cerebral infarction due to thrombosis of unspecified precerebral artery: Secondary | ICD-10-CM | POA: Insufficient documentation

## 2022-01-23 DIAGNOSIS — K5790 Diverticulosis of intestine, part unspecified, without perforation or abscess without bleeding: Secondary | ICD-10-CM | POA: Insufficient documentation

## 2022-01-23 DIAGNOSIS — Z923 Personal history of irradiation: Secondary | ICD-10-CM | POA: Diagnosis not present

## 2022-01-23 DIAGNOSIS — E440 Moderate protein-calorie malnutrition: Secondary | ICD-10-CM | POA: Diagnosis not present

## 2022-01-23 DIAGNOSIS — R627 Adult failure to thrive: Secondary | ICD-10-CM

## 2022-01-23 DIAGNOSIS — Z7982 Long term (current) use of aspirin: Secondary | ICD-10-CM | POA: Diagnosis not present

## 2022-01-23 DIAGNOSIS — Z853 Personal history of malignant neoplasm of breast: Secondary | ICD-10-CM | POA: Diagnosis not present

## 2022-01-23 DIAGNOSIS — K5900 Constipation, unspecified: Secondary | ICD-10-CM | POA: Diagnosis not present

## 2022-01-23 DIAGNOSIS — K219 Gastro-esophageal reflux disease without esophagitis: Secondary | ICD-10-CM | POA: Diagnosis not present

## 2022-01-23 DIAGNOSIS — Z8673 Personal history of transient ischemic attack (TIA), and cerebral infarction without residual deficits: Secondary | ICD-10-CM | POA: Diagnosis not present

## 2022-01-23 DIAGNOSIS — E7800 Pure hypercholesterolemia, unspecified: Secondary | ICD-10-CM | POA: Diagnosis not present

## 2022-01-23 DIAGNOSIS — R6881 Early satiety: Secondary | ICD-10-CM | POA: Diagnosis not present

## 2022-01-23 DIAGNOSIS — E860 Dehydration: Secondary | ICD-10-CM | POA: Diagnosis not present

## 2022-01-23 DIAGNOSIS — R110 Nausea: Secondary | ICD-10-CM | POA: Diagnosis not present

## 2022-01-23 DIAGNOSIS — C50919 Malignant neoplasm of unspecified site of unspecified female breast: Secondary | ICD-10-CM | POA: Insufficient documentation

## 2022-01-23 DIAGNOSIS — K8020 Calculus of gallbladder without cholecystitis without obstruction: Secondary | ICD-10-CM | POA: Diagnosis not present

## 2022-01-23 DIAGNOSIS — K8070 Calculus of gallbladder and bile duct without cholecystitis without obstruction: Secondary | ICD-10-CM

## 2022-01-23 NOTE — Progress Notes (Signed)
Patient ID: [name] [name], female   DOB: [birth year], 81 y.o.   MRN: [id number]  HPI [name] [name] is a 81 y.o. female seen in consultation at the request of Dr. Lottie Dawson.  She was recently seen in the emergency room a few days ago due to weight loss and nausea.  She continues to be nauseated, weak and with decreased appetite and poor p.o. intake.  She denies any abdominal pain.  No fevers no chills.  She also reports that she does have on a steady weight and some tremors that have been getting worse lately.  And she underwent appropriate work-up to include a CT scan of the abdomen pelvis and head as well as ultrasound of the right upper quadrant.  Please note that I personally reviewed the images.  There is evidence of cholelithiasis without cholecystitis.  The head CT there is no acute intra-cranial abnormalities but this was done without contrast.  There is no evidence of common bile duct stones. Her last sodium was 127 with chloride of 94, rest of the CMP was completely normal.  She does have recent unsteady gait. No evidence of focal neurological symptoms.  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   Trigger finger     Past Surgical History:  Procedure Laterality Date   ABDOMINAL HYSTERECTOMY      APPENDECTOMY  1954   BREAST BIOPSY Left 1986   neg   BREAST BIOPSY Left 1988   neg   BREAST BIOPSY Left 1995   neg   BREAST BIOPSY Left 2009   positive (ultrasound guided biopsy)   BREAST LUMPECTOMY     BROW LIFT  02/18/2021   Procedure: BLEPHAROPTOSIS REPAIR; RESECT EX BILATERAL;  Surgeon: Karle Starch, MD;  Location: [place];  Service: Ophthalmology;;   COLONOSCOPY  07/31/2013   COLONOSCOPY WITH PROPOFOL N/A 01/10/2016   Procedure: COLONOSCOPY WITH PROPOFOL;  Surgeon: Manya Silvas, MD;  Location: [facility] ENDOSCOPY;  Service: Endoscopy;  Laterality: N/A;   ESOPHAGOGASTRODUODENOSCOPY  1/292015   ESOPHAGOGASTRODUODENOSCOPY N/A 01/09/2016   Procedure: ESOPHAGOGASTRODUODENOSCOPY (EGD);  Surgeon: Manya Silvas, MD;  Location: [facility] ENDOSCOPY;  Service: Endoscopy;  Laterality: N/A;   eye lid lift     [place]   WRIST SURGERY  2009, 2006   ganglion cysts removal    Family History  Problem Relation Age of Onset   Prostate cancer Brother    Crohn's disease Brother    Heart attack Father    Prostate cancer Father    Vaginal cancer Maternal Grandmother    Stroke Mother    Breast cancer Neg Hx     Social History Social History   Tobacco  Use   Smoking status: Never   Smokeless tobacco: Never  Substance Use Topics   Alcohol use: No    Alcohol/week: 0.0 standard drinks of alcohol   Drug use: No    Allergies  Allergen Reactions   Levofloxacin Other (See Comments)    Causes muscle problems.   Demerol [Meperidine] Nausea Only   Other Nausea And Vomiting   Sulfa Antibiotics Nausea Only    Current Outpatient Medications  Medication Sig Dispense Refill   losartan (COZAAR) 25 MG tablet Take 25 mg by mouth daily.     ondansetron (ZOFRAN-ODT) 4 MG disintegrating tablet Take 1 tablet (4 mg total) by mouth every 6 (six) hours as needed for nausea or vomiting. 20 tablet 0   pantoprazole (PROTONIX) 40 MG tablet Take 1 tablet (40 mg  total) by mouth daily. 30 tablet 1   pravastatin (PRAVACHOL) 40 MG tablet Take 40 mg by mouth at bedtime.      sucralfate (CARAFATE) 1 g tablet Take 1 tablet (1 g total) by mouth 4 (four) times daily. 120 tablet 1   No current facility-administered medications for this visit.     Review of Systems Full ROS  was asked and was negative except for the information on the HPI  Physical Exam Blood pressure (!) 141/85, pulse 98, temperature 98 F (36.7 C), temperature source Oral, height '5\' 1"'$  (1.549 m), weight 108 lb 6.4 oz (49.2 kg), SpO2 97 %. CONSTITUTIONAL: Frail and debilitated, dehydrated. EYES: Pupils are equal, round, and sunken eyeballs from dehydration, Sclera are non-icteric. EARS, NOSE, MOUTH AND THROAT: The oropharynx is clear. The oral mucosa is pink and moist. Hearing is intact to voice. LYMPH NODES:  Lymph nodes in the neck are normal. RESPIRATORY:  Lungs are clear. There is normal respiratory effort, with equal breath sounds bilaterally, and without pathologic use of accessory muscles. CARDIOVASCULAR: Heart is regular without murmurs, gallops, or rubs. GI: The abdomen is  soft, nontender, and nondistended. There are no palpable masses. There is no hepatosplenomegaly. There are normal bowel sounds in all quadrants. GU: Rectal deferred.   MUSCULOSKELETAL: Normal muscle strength and tone. No cyanosis or edema.   SKIN: Turgor is good and there are no pathologic skin lesions or ulcers. NEUROLOGIC: Motor and sensation is grossly normal. Cranial nerves are grossly intact. PSYCH:  Oriented to person, place and time. Affect is normal.  Data Reviewed  I have personally reviewed the patient's imaging, laboratory findings and medical records.    Assessment/Plan   81 year old female with multiple medical issues currently failure to thrive, chronic nausea and weight loss as well as degenerative neurological symptoms.  She does have cholelithiasis without evidence of cholecystitis.  I do  think this is incidental findings.  I had an extensive discussion with her and the daughter and was very honest about her disease process.  I do not think that her gallstones are causing all her symptoms.  I do think that she merits admission to the hospital for extensive work-up to include potential neuroendocrine disorder, SIADH, occult malignancy.  I have recommend them to go to the ER.I do think that at this time if we perform surgical intervention it may hurt her given the degree of hyponatremia. I was able to relay the message to both the patient and the family and also Dr. Ellender Hose from the emergency room. No need for surgical intervention at this time.  If we can sort her issues and she is optimized from a medical perspective we may contemplate  cholecystectomy in the future.  Please note that I spent greater than 70 minutes in this encounter including personally reviewing imaging studies, coordinating her care, placing orders and performing appropriate documentation  Caroleen Hamman, MD FACS General Surgeon 01/23/2022, 4:08 PM

## 2022-01-23 NOTE — Patient Instructions (Addendum)
Please go directly to the Emergency room.    Cholelithiasis  Cholelithiasis is a disease in which gallstones form in the gallbladder. The gallbladder is an organ that stores bile. Bile is a fluid that helps to digest fats. Gallstones begin as small crystals and can slowly grow into stones. They may cause no symptoms until they block the gallbladder duct, or cystic duct, when the gallbladder tightens (contracts) after food is eaten. This can cause pain and is known as a gallbladder attack, or biliary colic. There are two main types of gallstones: Cholesterol stones. These are the most common type of gallstone. These stones are made of hardened cholesterol and are usually yellow-green in color. Cholesterol is a fat-like substance that is made in the liver. Pigment stones. These are dark in color and are made of a red-yellow substance, called bilirubin,that forms when hemoglobin from red blood cells breaks down. What are the causes? This condition may be caused by an imbalance in the different parts that make bile. This can happen if the bile: Has too much bilirubin. This can happen in certain blood diseases, such as sickle cell anemia. Has too much cholesterol. Does not have enough bile salts. These salts help the body absorb and digest fats. In some cases, this condition can also be caused by the gallbladder not emptying completely or often enough. This is common during pregnancy. What increases the risk? The following factors may make you more likely to develop this condition: Being female. Having multiple pregnancies. Health care providers sometimes advise removing diseased gallbladders before future pregnancies. Eating a diet that is heavy in fried foods, fat, and refined carbohydrates, such as white bread and white rice. Being obese. Being older than age 37. Using medicines that contain female hormones (estrogen) for a long time. Losing weight quickly. Having a family history of  gallstones. Having certain medical problems, such as: Diabetes mellitus. Cystic fibrosis. Crohn's disease. Cirrhosis or other long-term (chronic) liver disease. Certain blood diseases, such as sickle cell anemia or leukemia. What are the signs or symptoms? In many cases, having gallstones causes no symptoms. When you have gallstones but do not have symptoms, you have silent gallstones. If a gallstone blocks your bile duct, it can cause a gallbladder attack. The main symptom of a gallbladder attack is sudden pain in the upper right part of the abdomen. The pain: Usually comes at night or after eating. Can last for one hour or more. Can spread to your right shoulder, back, or chest. Can feel like indigestion. This is discomfort, burning, or fullness in your upper abdomen. If the bile duct is blocked for more than a few hours, it can cause an infection or inflammation of your gallbladder (cholecystitis), liver, or pancreas. This can cause: Nausea or vomiting. Bloating. Pain in your abdomen that lasts for 5 hours or longer. Tenderness in your upper abdomen, often in the upper right section and under your rib cage. Fever or chills. Skin or the white parts of your eyes turning yellow (jaundice). This usually happens when a stone has blocked bile from passing through the common bile duct. Dark urine or light-colored stools. How is this diagnosed? This condition may be diagnosed based on: A physical exam. Your medical history. Ultrasound. CT scan. MRI. You may also have other tests, including: Blood tests to check for signs of an infection or inflammation. Cholescintigraphy, or HIDA scan. This is a scan of your gallbladder and bile ducts (biliary system) using non-harmful radioactive material and special  cameras that can see the radioactive material. Endoscopic retrograde cholangiopancreatogram. This involves inserting a small tube with a camera on the end (endoscope) through your mouth to  look at bile ducts and check for blockages. How is this treated? Treatment for this condition depends on the severity of the condition. Silent gallstones do not need treatment. Treatment may be needed if a blockage causes a gallbladder attack or other symptoms. Treatment may include: Home care, if symptoms are not severe. During a simple gallbladder attack, stop eating and drinking for 12-24 hours (except for water and clear liquids). This helps to "cool down" your gallbladder. After 1 or 2 days, you can start to eat a diet of simple or clear foods, such as broths and crackers. You may also need medicines for pain or nausea or both. If you have cholecystitis and an infection, you will need antibiotics. A hospital stay, if needed for pain control or for cholecystitis with severe infection. Cholecystectomy, or surgery to remove your gallbladder. This is the most common treatment if all other treatments have not worked. Medicines to break up gallstones. These are most effective at treating small gallstones. Medicines may be used for up to 6-12 months. Endoscopic retrograde cholangiopancreatogram. A small basket can be attached to the endoscope and used to capture and remove gallstones, mainly those that are in the common bile duct. Follow these instructions at home: Medicines Take over-the-counter and prescription medicines only as told by your health care provider. If you were prescribed an antibiotic medicine, take it as told by your health care provider. Do not stop taking the antibiotic even if you start to feel better. Ask your health care provider if the medicine prescribed to you requires you to avoid driving or using machinery. Eating and drinking Drink enough fluid to keep your urine pale yellow. This is important during a gallbladder attack. Water and clear liquids are preferred. Follow a healthy diet. This includes: Reducing fatty foods, such as fried food and foods high in  cholesterol. Reducing refined carbohydrates, such as white bread and white rice. Eating more fiber. Aim for foods such as almonds, fruit, and beans. Alcohol use If you drink alcohol: Limit how much you use to: 0-1 drink a day for nonpregnant women. 0-2 drinks a day for men. Be aware of how much alcohol is in your drink. In the [place] one drink equals one 12 oz bottle of beer (355 mL), one 5 oz glass of wine (148 mL), or one 1 oz glass of hard liquor (44 mL). General instructions Do not use any products that contain nicotine or tobacco, such as cigarettes, e-cigarettes, and chewing tobacco. If you need help quitting, ask your health care provider. Maintain a healthy weight. Keep all follow-up visits as told by your health care provider. These may include consultations with a surgeon or specialist. This is important. Where to find more information [employer] of Diabetes and Digestive and Kidney Diseases: [email or web address] Contact a health care provider if: You think you have had a gallbladder attack. You have been diagnosed with silent gallstones and you develop pain in your abdomen or indigestion. You begin to have attacks more often. You have dark urine or light-colored stools. Get help right away if: You have pain from a gallbladder attack that lasts for more than 2 hours. You have pain in your abdomen that lasts for more than 5 hours or is getting worse. You have a fever or chills. You have nausea and vomiting that do not go  away. You develop jaundice. Summary Cholelithiasis is a disease in which gallstones form in the gallbladder. This condition may be caused by an imbalance in the different parts that make bile. This can happen if your bile has too much bilirubin or cholesterol, or does not have enough bile salts. Treatment for gallstones depends on the severity of the condition. Silent gallstones do not need treatment. If gallstones cause a gallbladder attack or other  symptoms, treatment usually involves not eating or drinking anything. Treatment may also include pain medicines and antibiotics, and it sometimes includes a hospital stay. Surgery to remove the gallbladder is common if all other treatments have not worked. This information is not intended to replace advice given to you by your health care provider. Make sure you discuss any questions you have with your health care provider. Document Revised: 05/12/2019 Document Reviewed: 05/12/2019 Elsevier Patient Education  [place].

## 2022-01-24 DIAGNOSIS — E785 Hyperlipidemia, unspecified: Secondary | ICD-10-CM | POA: Diagnosis not present

## 2022-01-24 DIAGNOSIS — Z8719 Personal history of other diseases of the digestive system: Secondary | ICD-10-CM | POA: Diagnosis not present

## 2022-01-24 DIAGNOSIS — I10 Essential (primary) hypertension: Secondary | ICD-10-CM | POA: Diagnosis not present

## 2022-01-24 DIAGNOSIS — R627 Adult failure to thrive: Secondary | ICD-10-CM | POA: Diagnosis not present

## 2022-01-24 DIAGNOSIS — R634 Abnormal weight loss: Secondary | ICD-10-CM | POA: Diagnosis not present

## 2022-01-24 DIAGNOSIS — R6881 Early satiety: Secondary | ICD-10-CM | POA: Diagnosis not present

## 2022-01-24 DIAGNOSIS — Z8673 Personal history of transient ischemic attack (TIA), and cerebral infarction without residual deficits: Secondary | ICD-10-CM | POA: Diagnosis not present

## 2022-01-24 DIAGNOSIS — E871 Hypo-osmolality and hyponatremia: Secondary | ICD-10-CM | POA: Diagnosis not present

## 2022-01-24 DIAGNOSIS — R110 Nausea: Secondary | ICD-10-CM | POA: Diagnosis not present

## 2022-01-24 DIAGNOSIS — R6339 Other feeding difficulties: Secondary | ICD-10-CM | POA: Diagnosis not present

## 2022-01-25 DIAGNOSIS — R6881 Early satiety: Secondary | ICD-10-CM | POA: Diagnosis not present

## 2022-01-25 DIAGNOSIS — Z8673 Personal history of transient ischemic attack (TIA), and cerebral infarction without residual deficits: Secondary | ICD-10-CM | POA: Diagnosis not present

## 2022-01-25 DIAGNOSIS — R110 Nausea: Secondary | ICD-10-CM | POA: Diagnosis not present

## 2022-01-25 DIAGNOSIS — E871 Hypo-osmolality and hyponatremia: Secondary | ICD-10-CM | POA: Diagnosis not present

## 2022-01-25 DIAGNOSIS — R634 Abnormal weight loss: Secondary | ICD-10-CM | POA: Diagnosis not present

## 2022-01-25 DIAGNOSIS — I10 Essential (primary) hypertension: Secondary | ICD-10-CM | POA: Diagnosis not present

## 2022-01-25 DIAGNOSIS — E785 Hyperlipidemia, unspecified: Secondary | ICD-10-CM | POA: Diagnosis not present

## 2022-01-25 NOTE — Progress Notes (Unsigned)
Medical Clearance has been received from Dr Edwina Barth. The patient is cleared at [facility] for surgery.

## 2022-01-26 DIAGNOSIS — R197 Diarrhea, unspecified: Secondary | ICD-10-CM | POA: Diagnosis not present

## 2022-01-26 DIAGNOSIS — R634 Abnormal weight loss: Secondary | ICD-10-CM | POA: Diagnosis not present

## 2022-01-26 DIAGNOSIS — R110 Nausea: Secondary | ICD-10-CM | POA: Diagnosis not present

## 2022-01-26 DIAGNOSIS — I10 Essential (primary) hypertension: Secondary | ICD-10-CM | POA: Diagnosis not present

## 2022-01-26 DIAGNOSIS — N12 Tubulo-interstitial nephritis, not specified as acute or chronic: Secondary | ICD-10-CM | POA: Diagnosis not present

## 2022-01-26 DIAGNOSIS — E871 Hypo-osmolality and hyponatremia: Secondary | ICD-10-CM | POA: Diagnosis not present

## 2022-01-26 DIAGNOSIS — M545 Low back pain, unspecified: Secondary | ICD-10-CM | POA: Diagnosis not present

## 2022-01-26 DIAGNOSIS — R531 Weakness: Secondary | ICD-10-CM | POA: Diagnosis not present

## 2022-01-26 DIAGNOSIS — E785 Hyperlipidemia, unspecified: Secondary | ICD-10-CM | POA: Diagnosis not present

## 2022-01-26 DIAGNOSIS — N390 Urinary tract infection, site not specified: Secondary | ICD-10-CM | POA: Diagnosis not present

## 2022-01-26 DIAGNOSIS — R112 Nausea with vomiting, unspecified: Secondary | ICD-10-CM | POA: Diagnosis not present

## 2022-01-27 DIAGNOSIS — R110 Nausea: Secondary | ICD-10-CM | POA: Diagnosis not present

## 2022-02-01 ENCOUNTER — Encounter: Payer: Self-pay | Admitting: Gastroenterology

## 2022-02-01 DIAGNOSIS — Z09 Encounter for follow-up examination after completed treatment for conditions other than malignant neoplasm: Secondary | ICD-10-CM | POA: Diagnosis not present

## 2022-02-01 DIAGNOSIS — N12 Tubulo-interstitial nephritis, not specified as acute or chronic: Secondary | ICD-10-CM | POA: Diagnosis not present

## 2022-02-01 DIAGNOSIS — E7800 Pure hypercholesterolemia, unspecified: Secondary | ICD-10-CM | POA: Diagnosis not present

## 2022-02-01 NOTE — H&P (Signed)
Pre-Procedure H&P   Patient ID: [name] [name] is a 81 y.o. female.  Gastroenterology Provider: Annamaria Helling, DO  Referring Provider: Dawson Bills, NP PCP: Baxter Hire, MD  Date: 02/02/2022  HPI [name] is a 81 y.o. female who presents today for Esophagogastroduodenoscopy for Persistent nausea and vomiting with weight loss. Patient has had persistent nausea and vomiting x1 month.  She feels bloating and gas but cannot belch.  She notes no changes with food.  Has had a 10 pound weight loss with this process and right upper quadrant pain.  Notably hyponatremic has been hospitalized. She is taking NSAIDs  Gaviscon helps some of the bloating discomfort  Creatinine 0.5 hemoglobin 13 MCV 88 platelets 404,000  Personal history of CVA and breast cancer  July 2023 right upper quadrant ultrasound negative chest x-ray negative.  CT demonstrated moderate stool however symptoms did not improve with bowel cleanout.  CT of the head negative.    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   Trigger finger     Past Surgical History:  Procedure Laterality Date   ABDOMINAL HYSTERECTOMY     APPENDECTOMY  1954   BREAST BIOPSY Left 1986   neg   BREAST BIOPSY Left 1988   neg   BREAST BIOPSY Left 1995   neg   BREAST BIOPSY Left 2009   positive (ultrasound guided  biopsy)   BREAST LUMPECTOMY     BROW LIFT  02/18/2021   Procedure: BLEPHAROPTOSIS REPAIR; RESECT EX BILATERAL;  Surgeon: Karle Starch, MD;  Location: [facility];  Service: Ophthalmology;;   COLONOSCOPY  07/31/2013   COLONOSCOPY WITH PROPOFOL N/A 01/10/2016   Procedure: COLONOSCOPY WITH PROPOFOL;  Surgeon: Manya Silvas, MD;  Location: [facility] ENDOSCOPY;  Service: Endoscopy;  Laterality: N/A;   ESOPHAGOGASTRODUODENOSCOPY  1/292015   ESOPHAGOGASTRODUODENOSCOPY N/A 01/09/2016   Procedure: ESOPHAGOGASTRODUODENOSCOPY (EGD);  Surgeon: Manya Silvas, MD;  Location: [facility] ENDOSCOPY;  Service: Endoscopy;  Laterality: N/A;   eye lid lift     [place]   WRIST SURGERY  2009, 2006   ganglion cysts removal    Family History Son-UC No h/o GI disease or malignancy  Review of Systems  Constitutional:  Positive for unexpected weight change. Negative for activity change, appetite change, chills, diaphoresis, fatigue and fever.  HENT:  Negative for trouble swallowing and voice change.   Respiratory:  Negative for shortness of breath and wheezing.   Cardiovascular:  Negative for chest pain, palpitations and leg swelling.  Gastrointestinal:  Positive for abdominal distention, nausea and vomiting. Negative for abdominal pain, anal bleeding, blood in stool, constipation, diarrhea and rectal pain.  Musculoskeletal:  Negative for arthralgias and myalgias.  Skin:  Negative for color change and pallor.  Neurological:        Residual deficits from stroke  Psychiatric/Behavioral:  Negative for confusion.   All other systems reviewed and are negative.    Medications No current facility-administered medications on file prior to encounter.   Current Outpatient Medications on File Prior to Encounter  Medication Sig Dispense Refill   losartan (COZAAR) 25 MG tablet Take 25 mg by mouth daily.     ondansetron (ZOFRAN-ODT) 4 MG disintegrating tablet Take 1 tablet (4  mg total) by mouth every 6 (six) hours as needed for nausea or vomiting. 20 tablet 0   pantoprazole (PROTONIX) 40 MG tablet Take 1 tablet (40 mg total) by mouth daily. 30 tablet 1   pravastatin (PRAVACHOL) 40 MG tablet Take 40 mg by mouth at bedtime.      sucralfate (CARAFATE) 1 g tablet Take 1 tablet (1 g total) by mouth 4 (four) times daily. 120 tablet 1    Pertinent medications related to GI and procedure were reviewed by me with the patient prior to the procedure   Current Facility-Administered Medications:    0.9 %  sodium chloride infusion, , Intravenous, Continuous, Annamaria Helling, DO, Last Rate: 20 mL/hr at 02/02/22 1003, New Bag at 02/02/22 1003      Allergies  Allergen Reactions   Levofloxacin Other (See Comments)    Causes muscle problems.   Demerol [Meperidine] Nausea Only   Other Nausea And Vomiting   Sulfa Antibiotics Nausea Only   Allergies were reviewed by me prior to the procedure  Objective   Body mass index is 21.87 kg/m. Vitals:   02/02/22 0949  BP: 128/79  Pulse: 92  Resp: 14  Temp: (!) 97 F (36.1 C)  TempSrc: Temporal  SpO2: 100%  Weight: 50.8 kg  Height: 5' (1.524 m)     Physical Exam Vitals and nursing note reviewed.  Constitutional:      General: She is not in acute distress.    Appearance: She is not ill-appearing, toxic-appearing or diaphoretic.  HENT:     Head: Normocephalic and atraumatic.     Nose: Nose normal.     Mouth/Throat:     Mouth: Mucous membranes are moist.     Pharynx: Oropharynx is clear.  Eyes:     General: No scleral icterus.    Extraocular Movements: Extraocular movements intact.  Cardiovascular:     Rate and Rhythm: Normal rate and regular rhythm.     Heart sounds: Normal heart sounds. No murmur heard.    No friction rub. No gallop.  Pulmonary:     Effort: Pulmonary effort is normal. No respiratory distress.     Breath sounds: Normal breath sounds. No wheezing, rhonchi or rales.  Abdominal:      General: Bowel sounds are normal. There is no distension.     Palpations: Abdomen is soft.     Tenderness: There is no abdominal tenderness. There is no guarding or rebound.  Musculoskeletal:     Cervical back: Neck supple.     Right lower leg: No edema.     Left lower leg: No edema.  Skin:    General: Skin is warm and dry.     Coloration: Skin is not jaundiced or pale.  Neurological:     Mental Status: She is alert and oriented to person, place, and time. Mental status is at baseline.  Psychiatric:        Mood and Affect: Mood normal.        Thought Content: Thought  content normal.        Judgment: Judgment normal.      Assessment:  [name] is a 81 y.o. female  who presents today for Esophagogastroduodenoscopy for Persistent nausea and vomiting with weight loss.  Plan:  Esophagogastroduodenoscopy with possible intervention today  Esophagogastroduodenoscopy with possible biopsy, control of bleeding, polypectomy, and interventions as necessary has been discussed with the patient/patient representative. Informed consent was obtained from the patient/patient representative after explaining the indication, nature, and risks of the procedure including but not limited to death, bleeding, perforation, missed neoplasm/lesions, cardiorespiratory compromise, and reaction to medications. Opportunity for questions was given and appropriate answers were provided. Patient/patient representative has verbalized understanding is amenable to undergoing the procedure.   Annamaria Helling, DO  [facility]  Portions of the record may have been created with voice recognition software. Occasional wrong-word or 'sound-a-like' substitutions may have occurred due to the inherent limitations of voice recognition software.  Read the chart carefully and recognize, using context, where substitutions may have occurred.

## 2022-02-02 ENCOUNTER — Ambulatory Visit: Payer: No Typology Code available for payment source | Admitting: General Practice

## 2022-02-02 ENCOUNTER — Encounter: Payer: Self-pay | Admitting: Gastroenterology

## 2022-02-02 ENCOUNTER — Encounter
Admission: RE | Disposition: A | Discharge status: Home / Self Care | Payer: Self-pay | Source: Home / Self Care | Attending: Gastroenterology

## 2022-02-02 ENCOUNTER — Ambulatory Visit
Admission: RE | Admit: 2022-02-02 | Discharge: 2022-02-02 | Disposition: A | Discharge status: Home / Self Care | Payer: No Typology Code available for payment source | Attending: Gastroenterology | Admitting: Gastroenterology

## 2022-02-02 DIAGNOSIS — R140 Abdominal distension (gaseous): Secondary | ICD-10-CM | POA: Diagnosis not present

## 2022-02-02 DIAGNOSIS — Z853 Personal history of malignant neoplasm of breast: Secondary | ICD-10-CM | POA: Insufficient documentation

## 2022-02-02 DIAGNOSIS — Z923 Personal history of irradiation: Secondary | ICD-10-CM | POA: Insufficient documentation

## 2022-02-02 DIAGNOSIS — Z791 Long term (current) use of non-steroidal anti-inflammatories (NSAID): Secondary | ICD-10-CM | POA: Diagnosis not present

## 2022-02-02 DIAGNOSIS — Z85828 Personal history of other malignant neoplasm of skin: Secondary | ICD-10-CM | POA: Insufficient documentation

## 2022-02-02 DIAGNOSIS — R112 Nausea with vomiting, unspecified: Secondary | ICD-10-CM | POA: Diagnosis not present

## 2022-02-02 DIAGNOSIS — R634 Abnormal weight loss: Secondary | ICD-10-CM | POA: Insufficient documentation

## 2022-02-02 DIAGNOSIS — K317 Polyp of stomach and duodenum: Secondary | ICD-10-CM | POA: Diagnosis not present

## 2022-02-02 DIAGNOSIS — I10 Essential (primary) hypertension: Secondary | ICD-10-CM | POA: Diagnosis not present

## 2022-02-02 DIAGNOSIS — Z6821 Body mass index (BMI) 21.0-21.9, adult: Secondary | ICD-10-CM | POA: Diagnosis not present

## 2022-02-02 DIAGNOSIS — Q399 Congenital malformation of esophagus, unspecified: Secondary | ICD-10-CM | POA: Insufficient documentation

## 2022-02-02 DIAGNOSIS — B3781 Candidal esophagitis: Secondary | ICD-10-CM | POA: Insufficient documentation

## 2022-02-02 DIAGNOSIS — R1011 Right upper quadrant pain: Secondary | ICD-10-CM | POA: Insufficient documentation

## 2022-02-02 DIAGNOSIS — Z8673 Personal history of transient ischemic attack (TIA), and cerebral infarction without residual deficits: Secondary | ICD-10-CM | POA: Insufficient documentation

## 2022-02-02 DIAGNOSIS — K2100 Gastro-esophageal reflux disease with esophagitis, without bleeding: Secondary | ICD-10-CM | POA: Diagnosis not present

## 2022-02-02 DIAGNOSIS — K449 Diaphragmatic hernia without obstruction or gangrene: Secondary | ICD-10-CM | POA: Insufficient documentation

## 2022-02-02 DIAGNOSIS — D131 Benign neoplasm of stomach: Secondary | ICD-10-CM | POA: Diagnosis not present

## 2022-02-02 HISTORY — PX: ESOPHAGOGASTRODUODENOSCOPY (EGD) WITH PROPOFOL: SHX5813

## 2022-02-02 LAB — KOH PREP

## 2022-02-02 SURGERY — ESOPHAGOGASTRODUODENOSCOPY (EGD) WITH PROPOFOL
Anesthesia: General

## 2022-02-02 MED ORDER — ONDANSETRON HCL 4 MG/2ML IJ SOLN
INTRAMUSCULAR | Status: DC | PRN
  Administered 2022-02-02: 4 mg via INTRAVENOUS

## 2022-02-02 MED ORDER — LIDOCAINE HCL (CARDIAC) PF 100 MG/5ML IV SOSY
PREFILLED_SYRINGE | INTRAVENOUS | Status: DC | PRN
  Administered 2022-02-02: 50 mg via INTRAVENOUS

## 2022-02-02 MED ORDER — SODIUM CHLORIDE 0.9 % IV SOLN
INTRAVENOUS | Status: DC
Start: 2022-02-02 — End: 2022-02-02

## 2022-02-02 MED ORDER — PROPOFOL 500 MG/50ML IV EMUL
INTRAVENOUS | Status: DC | PRN
  Administered 2022-02-02: 50 ug/kg/min via INTRAVENOUS

## 2022-02-02 MED ORDER — PROPOFOL 10 MG/ML IV BOLUS
INTRAVENOUS | Status: DC | PRN
  Administered 2022-02-02: 30 mg via INTRAVENOUS

## 2022-02-02 NOTE — Interval H&P Note (Signed)
History and Physical Interval Note: Preprocedure H&P from 02/02/22  was reviewed and there was no interval change after seeing and examining the patient.  Written consent was obtained from the patient after discussion of risks, benefits, and alternatives. Patient has consented to proceed with Esophagogastroduodenoscopy with possible intervention   02/02/2022 10:51 AM  [name] [name]  has presented today for surgery, with the diagnosis of gerd.  The various methods of treatment have been discussed with the patient and family. After consideration of risks, benefits and other options for treatment, the patient has consented to  Procedure(s): ESOPHAGOGASTRODUODENOSCOPY (EGD) WITH PROPOFOL (N/A) as a surgical intervention.  The patient's history has been reviewed, patient examined, no change in status, stable for surgery.  I have reviewed the patient's chart and labs.  Questions were answered to the patient's satisfaction.     Annamaria Helling

## 2022-02-02 NOTE — Anesthesia Preprocedure Evaluation (Signed)
Anesthesia Evaluation  Patient identified by MRN, date of birth, ID band Patient awake    Reviewed: Allergy & Precautions, NPO status , Patient's Chart, lab work & pertinent test results  History of Anesthesia Complications (+) PONV and history of anesthetic complications  Airway Mallampati: IV  TM Distance: >3 FB Neck ROM: full    Dental  (+) Poor Dentition   Pulmonary neg pulmonary ROS, neg shortness of breath, neg COPD,    Pulmonary exam normal        Cardiovascular hypertension, (-) CAD, (-) Past MI and (-) CABG negative cardio ROS Normal cardiovascular exam     Neuro/Psych CVA, No Residual Symptoms negative psych ROS   GI/Hepatic negative GI ROS, Neg liver ROS,   Endo/Other  Hypothyroidism   Renal/GU negative Renal ROS  negative genitourinary   Musculoskeletal   Abdominal   Peds  Hematology negative hematology ROS (+)   Anesthesia Other Findings Past Medical History: No date: Blood transfusion without reported diagnosis 2009: Breast cancer ([place])     Comment:  left breast, radiation No date: Diverticulosis No date: Diverticulosis No date: H/O bone density study     Comment:  05/30/06; 06/04/08; 06/05/12 No date: Hemorrhoids No date: History of bladder infections No date: History of chicken pox No date: History of CVA (cerebrovascular accident) No date: Hyperlipidemia No date: Hypertension No date: Personal history of radiation therapy No date: PONV (postoperative nausea and vomiting) 07/19/2021: SCC (squamous cell carcinoma)     Comment:  left forearm anterior, [facility] 08/18/2021 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 No date: Trigger finger  Past Surgical History: No date: ABDOMINAL HYSTERECTOMY 1954: APPENDECTOMY 1986: BREAST BIOPSY; Left     Comment:  neg 1988: BREAST BIOPSY; Left     Comment:  neg 1995: BREAST BIOPSY; Left      Comment:  neg 2009: BREAST BIOPSY; Left     Comment:  positive (ultrasound guided biopsy) No date: BREAST LUMPECTOMY 02/18/2021: BROW LIFT     Comment:  Procedure: BLEPHAROPTOSIS REPAIR; RESECT EX BILATERAL;                Surgeon: Karle Starch, MD;  Location: [facility];  Service: Ophthalmology;; 07/31/2013: COLONOSCOPY 01/10/2016: COLONOSCOPY WITH PROPOFOL; N/A     Comment:  Procedure: COLONOSCOPY WITH PROPOFOL;  Surgeon: Manya Silvas, MD;  Location: [facility] ENDOSCOPY;  Service:               Endoscopy;  Laterality: N/A; 1/292015: ESOPHAGOGASTRODUODENOSCOPY 01/09/2016: ESOPHAGOGASTRODUODENOSCOPY; N/A     Comment:  Procedure: ESOPHAGOGASTRODUODENOSCOPY (EGD);  Surgeon:               Manya Silvas, MD;  Location: [facility] ENDOSCOPY;                Service: Endoscopy;  Laterality: N/A; No date: eye lid lift No date: [place]: TONSILLECTOMY 2009, 2006: WRIST SURGERY     Comment:  ganglion cysts removal  BMI    Body Mass Index: 21.87 kg/m      Reproductive/Obstetrics negative OB ROS                             Anesthesia Physical Anesthesia Plan  ASA: 2  Anesthesia Plan: General   Post-op Pain Management: Minimal or no pain anticipated   Induction: Intravenous  PONV Risk Score and Plan: 3 and Propofol infusion, TIVA and Ondansetron  Airway Management Planned: Nasal Cannula  Additional Equipment: None  Intra-op Plan:   Post-operative Plan:   Informed Consent: I have reviewed the patients History and Physical, chart, labs and discussed the procedure including the risks, benefits and alternatives for the proposed anesthesia with the patient or authorized representative who has indicated his/her understanding and acceptance.     Dental advisory given  Plan Discussed with: CRNA and Surgeon  Anesthesia Plan Comments: (Discussed risks of anesthesia with patient, including possibility of  difficulty with spontaneous ventilation under anesthesia necessitating airway intervention, PONV, and rare risks such as cardiac or respiratory or neurological events, and allergic reactions. Discussed the role of CRNA in patient's perioperative care. Patient understands.)        Anesthesia Quick Evaluation

## 2022-02-02 NOTE — Anesthesia Postprocedure Evaluation (Signed)
Anesthesia Post Note  Patient: [name] [name]  Procedure(s) Performed: ESOPHAGOGASTRODUODENOSCOPY (EGD) WITH PROPOFOL  Patient location during evaluation: Endoscopy Anesthesia Type: General Level of consciousness: awake and alert Pain management: pain level controlled Vital Signs Assessment: post-procedure vital signs reviewed and stable Respiratory status: spontaneous breathing, nonlabored ventilation, respiratory function stable and patient connected to nasal cannula oxygen Cardiovascular status: blood pressure returned to baseline and stable Postop Assessment: no apparent nausea or vomiting Anesthetic complications: no   No notable events documented.   Last Vitals:  Vitals:   02/02/22 1112 02/02/22 1121  BP: 127/65 127/65  Pulse:    Resp:    Temp:  (!) 35.8 C  SpO2:      Last Pain:  Vitals:   02/02/22 1121  TempSrc: Temporal  PainSc: 0-No pain                 Dimas Millin

## 2022-02-02 NOTE — Op Note (Addendum)
[facility] Patient Name: [name] [name] Procedure Date: 02/02/2022 10:55 AM MRN: [id number] Account #: [id number] Date of Birth: [birth year] Admit Type: Outpatient Age: 81 Room: [facility] ENDO ROOM 3 Gender: Female Note Status: Supervisor Override Instrument Name: Upper Endoscope [id number] Procedure:             Upper GI endoscopy Indications:           Suspected gastro-esophageal reflux disease Providers:             Annamaria Helling DO, DO Referring MD:          Andres Labrum, MD (Referring MD) Medicines:             Monitored Anesthesia Care Complications:         No immediate complications. Estimated blood loss:                         Minimal. Procedure:             Pre-Anesthesia Assessment:                        - Prior to the procedure, a History and Physical was                         performed, and patient medications and allergies were                         reviewed. The patient is competent. The risks and                         benefits of the procedure and the sedation options and                         risks were discussed with the patient. All questions                         were answered and informed consent was obtained.                         Patient identification and proposed procedure were                         verified by the physician, the nurse, the anesthetist                         and the technician in the endoscopy suite. Mental                         Status Examination: alert and oriented. Airway                         Examination: normal oropharyngeal airway and neck                         mobility. Respiratory Examination: clear to                         auscultation. CV Examination: RRR, no murmurs, no S3  or S4. Prophylactic Antibiotics: The patient does not                         require prophylactic antibiotics. Prior                         Anticoagulants: The patient has  taken no previous                         anticoagulant or antiplatelet agents. ASA Grade                         Assessment: II - A patient with mild systemic disease.                         After reviewing the risks and benefits, the patient                         was deemed in satisfactory condition to undergo the                         procedure. The anesthesia plan was to use monitored                         anesthesia care (MAC). Immediately prior to                         administration of medications, the patient was                         re-assessed for adequacy to receive sedatives. The                         heart rate, respiratory rate, oxygen saturations,                         blood pressure, adequacy of pulmonary ventilation, and                         response to care were monitored throughout the                         procedure. The physical status of the patient was                         re-assessed after the procedure.                        After obtaining informed consent, the endoscope was                         passed under direct vision. Throughout the procedure,                         the patient's blood pressure, pulse, and oxygen                         saturations were monitored continuously. The Endoscope  was introduced through the mouth, and advanced to the                         second part of duodenum. The upper GI endoscopy was                         accomplished without difficulty. The patient tolerated                         the procedure well. Findings:      The duodenal bulb, first portion of the duodenum and second portion of       the duodenum were normal. Estimated blood loss: none.      A small hiatal hernia was present. Estimated blood loss: none.      Multiple 1 to 8 mm sessile polyps with no bleeding and no stigmata of       recent bleeding were found in the gastric body and on the greater        curvature of the stomach. Biopsies were taken with a cold forceps for       histology. Estimated blood loss was minimal.      A deformity was found in the gastric body. Estimated blood loss: none.      The exam of the stomach was otherwise normal.      The Z-line was regular. Estimated blood loss: none.      Esophagogastric landmarks were identified: the gastroesophageal junction       was found at 35 cm from the incisors.      Diffuse, white plaques were found in the entire esophagus. Biopsies were       taken with a cold forceps for histology. Brushings for KOH prep were       obtained in the middle third of the esophagus. Estimated blood loss was       minimal.      The exam of the esophagus was otherwise normal. Impression:            - Normal duodenal bulb, first portion of the duodenum                         and second portion of the duodenum.                        - Small hiatal hernia.                        - Multiple gastric polyps. Biopsied.                        - [Etiology] deformity in the gastric body.                        - Z-line regular.                        - Esophagogastric landmarks identified.                        - Esophageal plaques were found, suspicious for                         candidiasis. Biopsied.  Brushings performed. Recommendation:        - Patient has a contact number available for                         emergencies. The signs and symptoms of potential                         delayed complications were discussed with the patient.                         Return to normal activities tomorrow. Written                         discharge instructions were provided to the patient.                        - Discharge patient to home.                        - Resume previous diet.                        - Continue present medications.                        - Recommend initiation of fluconazole for treatment of                         candidal  esophagitis and afterwards acid suppressive                         therapy. To be prescribed.                        - Await pathology results.                        - Repeat upper endoscopy for surveillance based on                         pathology results.                        - Return to GI office as previously scheduled.                        - The findings and recommendations were discussed with                         the patient. Procedure Code(s):     --- Professional ---                        [id number], Esophagogastroduodenoscopy, flexible,                         transoral; with biopsy, single or multiple Diagnosis Code(s):     --- Professional ---                        K44.9, Diaphragmatic hernia without obstruction or  gangrene                        K31.7, Polyp of stomach and duodenum                        K22.9, Disease of esophagus, unspecified CPT copyright 2019 [employer]. All rights reserved. The codes documented in this report are preliminary and upon coder review may  be revised to meet current compliance requirements. Attending Participation:      I personally performed the entire procedure. Volney American, DO Annamaria Helling DO, DO 02/02/2022 11:16:00 AM This report has been signed electronically. Number of Addenda: 0 Note Initiated On: 02/02/2022 10:55 AM Estimated Blood Loss:  Estimated blood loss was minimal.      [facility]

## 2022-02-02 NOTE — Transfer of Care (Signed)
Immediate Anesthesia Transfer of Care Note  Patient: [name] [name]  Procedure(s) Performed: ESOPHAGOGASTRODUODENOSCOPY (EGD) WITH PROPOFOL  Patient Location: PACU  Anesthesia Type:General  Level of Consciousness: sedated  Airway & Oxygen Therapy: Patient Spontanous Breathing and Patient connected to nasal cannula oxygen  Post-op Assessment: Report given to RN and Post -op Vital signs reviewed and stable  Post vital signs: Reviewed and stable  Last Vitals:  Vitals Value Taken Time  BP 127/65 02/02/22 1113  Temp    Pulse 72 02/02/22 1115  Resp 14 02/02/22 1115  SpO2 100 % 02/02/22 1115  Vitals shown include unvalidated device data.  Last Pain:  Vitals:   02/02/22 1112  TempSrc:   PainSc: 0-No pain         Complications: No notable events documented.

## 2022-02-03 ENCOUNTER — Encounter: Payer: Self-pay | Admitting: Gastroenterology

## 2022-02-03 DIAGNOSIS — E871 Hypo-osmolality and hyponatremia: Secondary | ICD-10-CM | POA: Diagnosis not present

## 2022-02-07 ENCOUNTER — Ambulatory Visit: Payer: No Typology Code available for payment source | Admitting: Surgery

## 2022-02-09 DIAGNOSIS — E871 Hypo-osmolality and hyponatremia: Secondary | ICD-10-CM | POA: Diagnosis not present

## 2022-02-09 DIAGNOSIS — K219 Gastro-esophageal reflux disease without esophagitis: Secondary | ICD-10-CM | POA: Diagnosis not present

## 2022-02-09 DIAGNOSIS — I6300 Cerebral infarction due to thrombosis of unspecified precerebral artery: Secondary | ICD-10-CM | POA: Diagnosis not present

## 2022-02-09 LAB — SURGICAL PATHOLOGY

## 2022-02-12 ENCOUNTER — Encounter: Payer: Self-pay | Admitting: Emergency Medicine

## 2022-02-12 ENCOUNTER — Emergency Department: Payer: No Typology Code available for payment source

## 2022-02-12 ENCOUNTER — Inpatient Hospital Stay: Payer: No Typology Code available for payment source

## 2022-02-12 ENCOUNTER — Inpatient Hospital Stay
Admit: 2022-02-12 | Discharge: 2022-02-12 | Disposition: A | Discharge status: Home / Self Care | Payer: No Typology Code available for payment source | Attending: Internal Medicine | Admitting: Internal Medicine

## 2022-02-12 ENCOUNTER — Other Ambulatory Visit: Payer: Self-pay

## 2022-02-12 ENCOUNTER — Inpatient Hospital Stay
Admission: EM | Admit: 2022-02-12 | Discharge: 2022-02-21 | DRG: 982 | Disposition: A | Discharge status: Home Health | Payer: No Typology Code available for payment source | Attending: Internal Medicine | Admitting: Internal Medicine

## 2022-02-12 DIAGNOSIS — Z823 Family history of stroke: Secondary | ICD-10-CM | POA: Diagnosis not present

## 2022-02-12 DIAGNOSIS — Z87440 Personal history of urinary (tract) infections: Secondary | ICD-10-CM

## 2022-02-12 DIAGNOSIS — I10 Essential (primary) hypertension: Secondary | ICD-10-CM

## 2022-02-12 DIAGNOSIS — R251 Tremor, unspecified: Secondary | ICD-10-CM | POA: Diagnosis not present

## 2022-02-12 DIAGNOSIS — Z8249 Family history of ischemic heart disease and other diseases of the circulatory system: Secondary | ICD-10-CM | POA: Diagnosis not present

## 2022-02-12 DIAGNOSIS — E785 Hyperlipidemia, unspecified: Secondary | ICD-10-CM | POA: Diagnosis not present

## 2022-02-12 DIAGNOSIS — K8010 Calculus of gallbladder with chronic cholecystitis without obstruction: Secondary | ICD-10-CM | POA: Diagnosis present

## 2022-02-12 DIAGNOSIS — I2510 Atherosclerotic heart disease of native coronary artery without angina pectoris: Secondary | ICD-10-CM | POA: Diagnosis present

## 2022-02-12 DIAGNOSIS — Z8619 Personal history of other infectious and parasitic diseases: Secondary | ICD-10-CM

## 2022-02-12 DIAGNOSIS — K209 Esophagitis, unspecified without bleeding: Secondary | ICD-10-CM | POA: Diagnosis present

## 2022-02-12 DIAGNOSIS — R110 Nausea: Secondary | ICD-10-CM | POA: Diagnosis not present

## 2022-02-12 DIAGNOSIS — K8020 Calculus of gallbladder without cholecystitis without obstruction: Secondary | ICD-10-CM | POA: Diagnosis not present

## 2022-02-12 DIAGNOSIS — K430 Incisional hernia with obstruction, without gangrene: Secondary | ICD-10-CM | POA: Diagnosis not present

## 2022-02-12 DIAGNOSIS — K56600 Partial intestinal obstruction, unspecified as to cause: Secondary | ICD-10-CM | POA: Diagnosis present

## 2022-02-12 DIAGNOSIS — E860 Dehydration: Secondary | ICD-10-CM | POA: Diagnosis present

## 2022-02-12 DIAGNOSIS — K432 Incisional hernia without obstruction or gangrene: Secondary | ICD-10-CM | POA: Diagnosis not present

## 2022-02-12 DIAGNOSIS — E222 Syndrome of inappropriate secretion of antidiuretic hormone: Principal | ICD-10-CM | POA: Diagnosis present

## 2022-02-12 DIAGNOSIS — Z923 Personal history of irradiation: Secondary | ICD-10-CM

## 2022-02-12 DIAGNOSIS — E861 Hypovolemia: Secondary | ICD-10-CM | POA: Diagnosis present

## 2022-02-12 DIAGNOSIS — R4182 Altered mental status, unspecified: Secondary | ICD-10-CM | POA: Diagnosis not present

## 2022-02-12 DIAGNOSIS — Z90710 Acquired absence of both cervix and uterus: Secondary | ICD-10-CM | POA: Diagnosis not present

## 2022-02-12 DIAGNOSIS — R519 Headache, unspecified: Secondary | ICD-10-CM | POA: Diagnosis not present

## 2022-02-12 DIAGNOSIS — R1011 Right upper quadrant pain: Secondary | ICD-10-CM | POA: Diagnosis not present

## 2022-02-12 DIAGNOSIS — Z853 Personal history of malignant neoplasm of breast: Secondary | ICD-10-CM | POA: Diagnosis not present

## 2022-02-12 DIAGNOSIS — Z881 Allergy status to other antibiotic agents status: Secondary | ICD-10-CM

## 2022-02-12 DIAGNOSIS — E871 Hypo-osmolality and hyponatremia: Secondary | ICD-10-CM

## 2022-02-12 DIAGNOSIS — Z888 Allergy status to other drugs, medicaments and biological substances status: Secondary | ICD-10-CM

## 2022-02-12 DIAGNOSIS — G2581 Restless legs syndrome: Secondary | ICD-10-CM | POA: Diagnosis not present

## 2022-02-12 DIAGNOSIS — E039 Hypothyroidism, unspecified: Secondary | ICD-10-CM | POA: Diagnosis present

## 2022-02-12 DIAGNOSIS — K819 Cholecystitis, unspecified: Secondary | ICD-10-CM | POA: Diagnosis not present

## 2022-02-12 DIAGNOSIS — Z8673 Personal history of transient ischemic attack (TIA), and cerebral infarction without residual deficits: Secondary | ICD-10-CM | POA: Diagnosis not present

## 2022-02-12 DIAGNOSIS — K9189 Other postprocedural complications and disorders of digestive system: Secondary | ICD-10-CM | POA: Diagnosis not present

## 2022-02-12 DIAGNOSIS — Z86008 Personal history of in-situ neoplasm of other site: Secondary | ICD-10-CM

## 2022-02-12 DIAGNOSIS — I700 Atherosclerosis of aorta: Secondary | ICD-10-CM | POA: Diagnosis not present

## 2022-02-12 DIAGNOSIS — R109 Unspecified abdominal pain: Secondary | ICD-10-CM | POA: Diagnosis present

## 2022-02-12 DIAGNOSIS — N390 Urinary tract infection, site not specified: Secondary | ICD-10-CM | POA: Diagnosis not present

## 2022-02-12 DIAGNOSIS — Z882 Allergy status to sulfonamides status: Secondary | ICD-10-CM

## 2022-02-12 DIAGNOSIS — S2220XA Unspecified fracture of sternum, initial encounter for closed fracture: Secondary | ICD-10-CM | POA: Diagnosis not present

## 2022-02-12 DIAGNOSIS — R112 Nausea with vomiting, unspecified: Secondary | ICD-10-CM | POA: Diagnosis not present

## 2022-02-12 DIAGNOSIS — R1084 Generalized abdominal pain: Secondary | ICD-10-CM | POA: Diagnosis not present

## 2022-02-12 DIAGNOSIS — R55 Syncope and collapse: Principal | ICD-10-CM

## 2022-02-12 DIAGNOSIS — K439 Ventral hernia without obstruction or gangrene: Secondary | ICD-10-CM | POA: Diagnosis not present

## 2022-02-12 DIAGNOSIS — N3000 Acute cystitis without hematuria: Secondary | ICD-10-CM | POA: Diagnosis not present

## 2022-02-12 DIAGNOSIS — K219 Gastro-esophageal reflux disease without esophagitis: Secondary | ICD-10-CM | POA: Diagnosis not present

## 2022-02-12 DIAGNOSIS — I878 Other specified disorders of veins: Secondary | ICD-10-CM | POA: Diagnosis not present

## 2022-02-12 DIAGNOSIS — Z885 Allergy status to narcotic agent status: Secondary | ICD-10-CM

## 2022-02-12 DIAGNOSIS — K8019 Calculus of gallbladder with other cholecystitis with obstruction: Secondary | ICD-10-CM | POA: Diagnosis not present

## 2022-02-12 DIAGNOSIS — Z79899 Other long term (current) drug therapy: Secondary | ICD-10-CM

## 2022-02-12 DIAGNOSIS — K828 Other specified diseases of gallbladder: Secondary | ICD-10-CM | POA: Diagnosis not present

## 2022-02-12 DIAGNOSIS — K6389 Other specified diseases of intestine: Secondary | ICD-10-CM | POA: Diagnosis not present

## 2022-02-12 DIAGNOSIS — R001 Bradycardia, unspecified: Secondary | ICD-10-CM | POA: Diagnosis present

## 2022-02-12 DIAGNOSIS — E876 Hypokalemia: Secondary | ICD-10-CM | POA: Diagnosis present

## 2022-02-12 DIAGNOSIS — R52 Pain, unspecified: Secondary | ICD-10-CM

## 2022-02-12 LAB — COMPREHENSIVE METABOLIC PANEL
ALT: 19 U/L (ref 0–44)
ALT: 16 U/L (ref 0–44)
AST: 26 U/L (ref 15–41)
AST: 20 U/L (ref 15–41)
Albumin: 4.1 g/dL (ref 3.5–5.0)
Albumin: 3.7 g/dL (ref 3.5–5.0)
Alkaline Phosphatase: 57 U/L (ref 38–126)
Alkaline Phosphatase: 50 U/L (ref 38–126)
Anion gap: 9 (ref 5–15)
Anion gap: 12 (ref 5–15)
BUN: 7 mg/dL — ABNORMAL LOW (ref 8–23)
BUN: 6 mg/dL — ABNORMAL LOW (ref 8–23)
CO2: 25 mmol/L (ref 22–32)
CO2: 21 mmol/L — ABNORMAL LOW (ref 22–32)
Calcium: 9.2 mg/dL (ref 8.9–10.3)
Calcium: 8.7 mg/dL — ABNORMAL LOW (ref 8.9–10.3)
Chloride: 92 mmol/L — ABNORMAL LOW (ref 98–111)
Chloride: 92 mmol/L — ABNORMAL LOW (ref 98–111)
Creatinine, Ser: 0.58 mg/dL (ref 0.44–1.00)
Creatinine, Ser: 0.49 mg/dL (ref 0.44–1.00)
GFR, Estimated: >60 mL/min (ref >60)
GFR, Estimated: >60 mL/min (ref >60)
Glucose, Bld: 113 mg/dL — ABNORMAL HIGH (ref 70–99)
Glucose, Bld: 107 mg/dL — ABNORMAL HIGH (ref 70–99)
Potassium: 3.7 mmol/L (ref 3.5–5.1)
Potassium: 4 mmol/L (ref 3.5–5.1)
Sodium: 126 mmol/L — ABNORMAL LOW (ref 135–145)
Sodium: 125 mmol/L — ABNORMAL LOW (ref 135–145)
Total Bilirubin: 0.8 mg/dL (ref 0.3–1.2)
Total Bilirubin: 0.6 mg/dL (ref 0.3–1.2)
Total Protein: 7 g/dL (ref 6.5–8.1)
Total Protein: 6.4 g/dL — ABNORMAL LOW (ref 6.5–8.1)

## 2022-02-12 LAB — CBC
HCT: 37 % (ref 36.0–46.0)
Hemoglobin: 12.6 g/dL (ref 12.0–15.0)
MCH: 29.5 pg (ref 26.0–34.0)
MCHC: 34.1 g/dL (ref 30.0–36.0)
MCV: 86.7 fL (ref 80.0–100.0)
Platelets: 407 10*3/uL — ABNORMAL HIGH (ref 150–400)
RBC: 4.27 MIL/uL (ref 3.87–5.11)
RDW: 13.6 % (ref 11.5–15.5)
WBC: 10.2 10*3/uL (ref 4.0–10.5)
nRBC: 0 % (ref 0.0–0.2)

## 2022-02-12 LAB — BASIC METABOLIC PANEL WITH GFR
Anion gap: 8 (ref 5–15)
BUN: 6 mg/dL — ABNORMAL LOW (ref 8–23)
CO2: 22 mmol/L (ref 22–32)
Calcium: 8.8 mg/dL — ABNORMAL LOW (ref 8.9–10.3)
Chloride: 99 mmol/L (ref 98–111)
Creatinine, Ser: 0.63 mg/dL (ref 0.44–1.00)
GFR, Estimated: >60 mL/min
Glucose, Bld: 101 mg/dL — ABNORMAL HIGH (ref 70–99)
Potassium: 3.7 mmol/L (ref 3.5–5.1)
Sodium: 129 mmol/L — ABNORMAL LOW (ref 135–145)

## 2022-02-12 LAB — BASIC METABOLIC PANEL
Anion gap: 12 (ref 5–15)
Anion gap: 7 (ref 5–15)
BUN: 7 mg/dL — ABNORMAL LOW (ref 8–23)
BUN: 6 mg/dL — ABNORMAL LOW (ref 8–23)
CO2: 21 mmol/L — ABNORMAL LOW (ref 22–32)
CO2: 24 mmol/L (ref 22–32)
Calcium: 9.2 mg/dL (ref 8.9–10.3)
Calcium: 8.7 mg/dL — ABNORMAL LOW (ref 8.9–10.3)
Chloride: 93 mmol/L — ABNORMAL LOW (ref 98–111)
Chloride: 91 mmol/L — ABNORMAL LOW (ref 98–111)
Creatinine, Ser: 0.57 mg/dL (ref 0.44–1.00)
Creatinine, Ser: 0.49 mg/dL (ref 0.44–1.00)
GFR, Estimated: >60 mL/min (ref >60)
GFR, Estimated: >60 mL/min (ref >60)
Glucose, Bld: 109 mg/dL — ABNORMAL HIGH (ref 70–99)
Glucose, Bld: 110 mg/dL — ABNORMAL HIGH (ref 70–99)
Potassium: 3.8 mmol/L (ref 3.5–5.1)
Potassium: 3.9 mmol/L (ref 3.5–5.1)
Sodium: 126 mmol/L — ABNORMAL LOW (ref 135–145)
Sodium: 122 mmol/L — ABNORMAL LOW (ref 135–145)

## 2022-02-12 LAB — OSMOLALITY, URINE
Osmolality, Ur: 324 mOsm/kg (ref 300–900)
Osmolality, Ur: 335 mOsm/kg (ref 300–900)

## 2022-02-12 LAB — LACTIC ACID, PLASMA: Lactic Acid, Venous: 1.7 mmol/L (ref 0.5–1.9)

## 2022-02-12 LAB — PROTIME-INR
INR: 1 (ref 0.8–1.2)
Prothrombin Time: 13.4 s (ref 11.4–15.2)

## 2022-02-12 LAB — URIC ACID: Uric Acid, Serum: 2.3 mg/dL — ABNORMAL LOW (ref 2.5–7.1)

## 2022-02-12 LAB — URINALYSIS, ROUTINE W REFLEX MICROSCOPIC
Bilirubin Urine: NEGATIVE
Glucose, UA: 50 mg/dL — AB
Ketones, ur: 5 mg/dL — AB
Nitrite: NEGATIVE
Protein, ur: NEGATIVE mg/dL
Specific Gravity, Urine: 1.023 (ref 1.005–1.030)
pH: 8 (ref 5.0–8.0)

## 2022-02-12 LAB — SODIUM, URINE, RANDOM: Sodium, Ur: 115 mmol/L

## 2022-02-12 LAB — CBG MONITORING, ED: Glucose-Capillary: 111 mg/dL — ABNORMAL HIGH (ref 70–99)

## 2022-02-12 LAB — TROPONIN I (HIGH SENSITIVITY): Troponin I (High Sensitivity): 5 ng/L (ref <18)

## 2022-02-12 LAB — LIPASE, BLOOD: Lipase: 35 U/L (ref 11–51)

## 2022-02-12 LAB — MAGNESIUM: Magnesium: 1.7 mg/dL (ref 1.7–2.4)

## 2022-02-12 LAB — OSMOLALITY
Osmolality: 257 mOsm/kg — ABNORMAL LOW (ref 275–295)
Osmolality: 258 mOsm/kg — ABNORMAL LOW (ref 275–295)

## 2022-02-12 LAB — TSH: TSH: 2.026 u[IU]/mL (ref 0.350–4.500)

## 2022-02-12 LAB — T4, FREE: Free T4: 1.44 ng/dL — ABNORMAL HIGH (ref 0.61–1.12)

## 2022-02-12 MED ORDER — ONDANSETRON HCL 4 MG/2ML IJ SOLN
INTRAMUSCULAR | Status: AC
  Filled 2022-02-12: qty 2

## 2022-02-12 MED ORDER — ALUM & MAG HYDROXIDE-SIMETH 200-200-20 MG/5ML PO SUSP
30.0000 mL | Freq: Four times a day (QID) | ORAL | Status: DC | PRN
  Administered 2022-02-14: 30 mL via ORAL
  Filled 2022-02-12: qty 30

## 2022-02-12 MED ORDER — SODIUM CHLORIDE 1 G PO TABS: 1.0000 g | ORAL_TABLET | Freq: Two times a day (BID) | ORAL | Status: DC

## 2022-02-12 MED ORDER — PANTOPRAZOLE SODIUM 40 MG PO TBEC
40.0000 mg | DELAYED_RELEASE_TABLET | Freq: Every day | ORAL | Status: DC
  Administered 2022-02-12 – 2022-02-21 (×8): 40 mg via ORAL
  Filled 2022-02-12 (×8): qty 1

## 2022-02-12 MED ORDER — SODIUM CHLORIDE 0.9 % IV SOLN: INTRAVENOUS | Status: DC

## 2022-02-12 MED ORDER — MORPHINE SULFATE (PF) 2 MG/ML IV SOLN
0.5000 mg | INTRAVENOUS | Status: DC | PRN
  Administered 2022-02-14 – 2022-02-17 (×3): 0.5 mg via INTRAVENOUS
  Filled 2022-02-12 (×3): qty 1

## 2022-02-12 MED ORDER — SODIUM CHLORIDE 0.9 % IV BOLUS
500.0000 mL | Freq: Once | INTRAVENOUS | Status: AC
  Administered 2022-02-12: 500 mL via INTRAVENOUS

## 2022-02-12 MED ORDER — SODIUM CHLORIDE 1 G PO TABS
1.0000 g | ORAL_TABLET | Freq: Two times a day (BID) | ORAL | Status: DC
Start: 2022-02-12 — End: 2022-02-13
  Administered 2022-02-12 (×2): 1 g via ORAL
  Filled 2022-02-12 (×3): qty 1

## 2022-02-12 MED ORDER — SUCRALFATE 1 G PO TABS
1.0000 g | ORAL_TABLET | Freq: Four times a day (QID) | ORAL | Status: DC
  Administered 2022-02-12 – 2022-02-21 (×31): 1 g via ORAL
  Filled 2022-02-12 (×32): qty 1

## 2022-02-12 MED ORDER — SODIUM CHLORIDE 0.9 % IV SOLN
1.0000 g | INTRAVENOUS | Status: DC
  Administered 2022-02-12 – 2022-02-16 (×5): 1 g via INTRAVENOUS
  Filled 2022-02-12 (×3): qty 10
  Filled 2022-02-12: qty 1

## 2022-02-12 MED ORDER — IOHEXOL 350 MG/ML SOLN
100.0000 mL | Freq: Once | INTRAVENOUS | Status: AC | PRN
  Administered 2022-02-12: 100 mL via INTRAVENOUS

## 2022-02-12 MED ORDER — OXYCODONE-ACETAMINOPHEN 5-325 MG PO TABS
1.0000 | ORAL_TABLET | ORAL | Status: DC | PRN
  Administered 2022-02-12 – 2022-02-13 (×3): 1 via ORAL
  Filled 2022-02-12 (×3): qty 1

## 2022-02-12 MED ORDER — FLUCONAZOLE 100 MG PO TABS
100.0000 mg | ORAL_TABLET | Freq: Every day | ORAL | Status: DC
  Administered 2022-02-12 – 2022-02-21 (×8): 100 mg via ORAL
  Filled 2022-02-12 (×10): qty 1

## 2022-02-12 MED ORDER — ACETAMINOPHEN 325 MG PO TABS
650.0000 mg | ORAL_TABLET | Freq: Four times a day (QID) | ORAL | Status: DC | PRN
  Administered 2022-02-13: 650 mg via ORAL
  Filled 2022-02-12: qty 2

## 2022-02-12 MED ORDER — ENOXAPARIN SODIUM 40 MG/0.4ML IJ SOSY
40.0000 mg | PREFILLED_SYRINGE | INTRAMUSCULAR | Status: DC
  Administered 2022-02-12 – 2022-02-16 (×5): 40 mg via SUBCUTANEOUS
  Filled 2022-02-12 (×5): qty 0.4

## 2022-02-12 MED ORDER — SODIUM CHLORIDE 1 G PO TABS
1.0000 g | ORAL_TABLET | Freq: Two times a day (BID) | ORAL | Status: DC
  Filled 2022-02-12: qty 1

## 2022-02-12 MED ORDER — HYDRALAZINE HCL 20 MG/ML IJ SOLN
5.0000 mg | INTRAMUSCULAR | Status: DC | PRN
  Administered 2022-02-20: 5 mg via INTRAVENOUS
  Filled 2022-02-12: qty 1

## 2022-02-12 MED ORDER — LACTATED RINGERS IV BOLUS
1000.0000 mL | Freq: Once | INTRAVENOUS | Status: AC
  Administered 2022-02-12: 1000 mL via INTRAVENOUS

## 2022-02-12 MED ORDER — ONDANSETRON HCL 4 MG/2ML IJ SOLN
4.0000 mg | INTRAMUSCULAR | Status: AC
  Administered 2022-02-12: 4 mg via INTRAVENOUS

## 2022-02-12 MED ORDER — HYDRALAZINE HCL 20 MG/ML IJ SOLN
5.0000 mg | INTRAMUSCULAR | Status: DC | PRN
Start: 2022-02-12 — End: 2022-02-12

## 2022-02-12 MED ORDER — ONDANSETRON HCL 4 MG/2ML IJ SOLN
4.0000 mg | Freq: Three times a day (TID) | INTRAMUSCULAR | Status: DC | PRN
  Administered 2022-02-14 – 2022-02-17 (×3): 4 mg via INTRAVENOUS
  Filled 2022-02-12 (×4): qty 2

## 2022-02-12 MED ORDER — SENNA 8.6 MG PO TABS
1.0000 | ORAL_TABLET | Freq: Every evening | ORAL | Status: DC | PRN
  Administered 2022-02-14 – 2022-02-18 (×2): 8.6 mg via ORAL
  Filled 2022-02-12 (×2): qty 1

## 2022-02-12 NOTE — Assessment & Plan Note (Addendum)
Na 126 on admission.  Hypotonic.  On exam pt appears mildly hypovolemic.  Suspect due to poor oral intake and dehydration.  Pt reports drinking about three 12 oz glasses of water daily, sometimes less.  Other differential diagnosis includes SIADH.  TSH normal, free T4 mildly elevated. After initial fluids given, Na repeat was 122. Nephrology was consulted and recommended fluid restriction and salt tablets, suspecting SIADH.   The patient was initially continued on IV fluids with fluid restriction and salt tablets given. Her sodium improved to 133. The fluids were then stopped on 02/13/2022. Na dropped to 130 on the morning of 02/14/2022. Then they dropped to 125 on the morning of 02/15/2022. Nephrology gave one dose of tolvaptan. Na is stable in the 129-130 range since 02/17/2022. Continue to monitor.

## 2022-02-12 NOTE — Assessment & Plan Note (Addendum)
RUQ ultrasound on 01/18/2022 showed tiny gallstone, without evidence of cholecystitis.  On admission, no fever or leukocytosis.  Lipase normal 35, liver function normal. --General surgery following --Robotic assisted cholecystectomy on 02/14/2022.  --Supportive care - antiemetics, pain control PRN

## 2022-02-12 NOTE — ED Notes (Signed)
After EKG completion in triage EDT noted that patient slumped back in wheelchair and became unresponsive.   This RN and Jinny Blossom, RN to room, pt unresponsive to sternal rub, noted to have weak pulse and dinamap showed HR of 40.  After several seconds left radial pulses felt and patient wheeled to room 10.  Dr. Karma Greaser and other staff at bedside.

## 2022-02-12 NOTE — Assessment & Plan Note (Addendum)
Likely due to gallstone/cholecystitis.   CTA negative for dissection. --General surgery following --Surgery this afternoon

## 2022-02-12 NOTE — ED Notes (Signed)
Pt endorses nausea and pain. Provider notified. Pt provided nausea meds and fluids. Pt's son and daughter are bedside. Blankets given to pt.

## 2022-02-12 NOTE — Assessment & Plan Note (Signed)
-   Continue Diflucan -Continue Protonix and Carafate

## 2022-02-12 NOTE — H&P (Addendum)
History and Physical    CLYDIA NIEVES TKW:[id number] DOB: 26-Aug-1940 DOA: 02/12/2022  Referring MD/NP/PA:   PCP: Idelle Crouch, MD   Patient coming from:  The patient is coming from home.    Chief Complaint: syncope, abdominal pain  HPI: [name] [name] is a 81 y.o. female with medical history significant of HTN, HLD, stroke (patient is not aware of this diagnosis),hypothyroidism, left breast cancer (s/p for lumpectomy and radiation therapy), skin cancer, diverticulosis, post herpetic neuralgia, gallstone, possible esophagitis, who presents with syncope and abdominal pain.  Patient states that she has right upper quadrant abdominal pain for several months.  She was diagnosed as gallstone.  Right upper quadrant ultrasound on 01/18/2022 showed tiny gallstones without evidence of cholecystitis.  Patient was seen by Dr. Dahlia Byes of surgery on 7/24, planning to do surgery, however patient was found to have hyponatremia, therefore surgery has not been scheduled yet.  Patient states that her abdominal pain has worsened the last night. It is located in the right lower quadrant and epigastric area, constant, aching and burning-like pain, 8 out of 10 in severity, radiating to the middle back.  Associated with nausea, but no vomiting, diarrhea or abdominal pain.  No fever or chills.  Patient does not have chest pain, cough, shortness breath.  Denies symptoms of UTI. Of note, pt is started on Diflucan on 8/3 for possible esophagitis by GI.  Of note, while waiting for doing EKG, patient passed out for for second in ED. Pt was noted to have weak pulse, with HR of 40. Per report, patient was hypertensive, not hypotensive.  No seizure activity noted.  Patient moves all extremities.  No facial droop or slurred speech.   Data reviewed independently and ED Course: pt was found to have WBC 10.2, troponin 5, lactic acid 1.7, sodium 126, liver function normal, GFR> 60, positive urinalysis (hazy appearance, moderate  amount of leukocyte, rare bacteria, WBC 21-50), temperature normal, blood pressure 175/92, 147/86, heart rate 90, ED, 19, oxygen saturation 100% on room air.  CT of head is negative.  CTA of chest/pelvis/abdomen is negative for dissection.  Patient is admitted to telemetry bed as inpatient.   EKG: I have personally reviewed.  Sinus rhythm, QTc 427, L AE, Q wave in lead III.   Review of Systems:   General: no fevers, chills, no body weight gain, has fatigue HEENT: no blurry vision, hearing changes or sore throat Respiratory: no dyspnea, coughing, wheezing CV: no chest pain, no palpitations GI: has nausea, abdominal pain, no vomiting, diarrhea, constipation GU: no dysuria, burning on urination, increased urinary frequency, hematuria  Ext: no leg edema Neuro: no unilateral weakness, numbness, or tingling, no vision change or hearing loss. Has syncope Skin: no rash, no skin tear. MSK: No muscle spasm, no deformity, no limitation of range of movement in spin Heme: No easy bruising.  Travel history: No recent long distant travel.   Allergy:  Allergies  Allergen Reactions   Levofloxacin Other (See Comments)    Causes muscle problems.   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    Trigger finger     Past Surgical History:  Procedure Laterality Date   ABDOMINAL HYSTERECTOMY     APPENDECTOMY  1954   BREAST BIOPSY Left 1986   neg   BREAST BIOPSY Left 1988   neg   BREAST BIOPSY Left 1995   neg   BREAST BIOPSY Left 2009   positive (ultrasound guided biopsy)   BREAST LUMPECTOMY     BROW LIFT  02/18/2021   Procedure: BLEPHAROPTOSIS REPAIR; RESECT EX BILATERAL;  Surgeon: Karle Starch, MD;  Location: [place];  Service: Ophthalmology;;   COLONOSCOPY  07/31/2013   COLONOSCOPY WITH PROPOFOL N/A 01/10/2016   Procedure: COLONOSCOPY WITH PROPOFOL;  Surgeon: Manya Silvas, MD;  Location: [facility] ENDOSCOPY;  Service: Endoscopy;  Laterality: N/A;   ESOPHAGOGASTRODUODENOSCOPY  1/292015   ESOPHAGOGASTRODUODENOSCOPY N/A 01/09/2016   Procedure: ESOPHAGOGASTRODUODENOSCOPY (EGD);  Surgeon: Manya Silvas, MD;  Location: [facility] ENDOSCOPY;  Service: Endoscopy;  Laterality: N/A;   ESOPHAGOGASTRODUODENOSCOPY (EGD) WITH PROPOFOL N/A 02/02/2022   Procedure: ESOPHAGOGASTRODUODENOSCOPY (EGD) WITH PROPOFOL;  Surgeon: Annamaria Helling, DO;  Location: [place];  Service: Gastroenterology;  Laterality: N/A;   eye lid lift     [place]   WRIST SURGERY  2009, 2006   ganglion cysts removal    Social History:  reports that she has never smoked. She has never used smokeless tobacco. She reports that she does not drink alcohol and does not use drugs.  Family History:  Family History  Problem Relation Age of Onset   Prostate cancer Brother    Crohn's disease Brother    Heart attack Father    Prostate cancer Father    Vaginal cancer Maternal Grandmother    Stroke Mother    Breast cancer Neg Hx      Prior to Admission medications   Medication Sig Start Date End Date Taking? Authorizing Provider  Alum Hydroxide-Mag Carbonate 160-105 MG CHEW Chew 2 tablets by mouth daily as needed. 01/25/22 04/25/22 Yes [provider]   diclofenac Sodium (VOLTAREN) 1 % GEL Apply 2 g topically 4 (four) times daily. 01/25/22  Yes [provider]  fluconazole (DIFLUCAN) 100 MG tablet Take 100 mg by mouth daily. 02/02/22  Yes [provider]  pantoprazole (PROTONIX) 40 MG tablet Take by mouth. 01/25/22 02/24/22 Yes [provider]  senna (SENOKOT) 8.6 MG tablet Take 1 tablet by mouth daily.   Yes [provider]  simethicone (MYLICON) 80 MG chewable tablet Chew 160 mg by mouth 4 (four) times daily as needed. 01/25/22 04/25/22 Yes [provider]  sucralfate (CARAFATE) 1 g tablet Take 1 tablet (1 g total) by mouth 4 (four) times daily. 01/18/22 01/18/23 Yes Ward, Delice Bison, DO  aspirin EC 81 MG tablet Take 81 mg by mouth daily. Patient not taking: Reported on 02/12/2022    [provider]  losartan (COZAAR) 25 MG tablet Take 25 mg by mouth daily. Patient not taking: Reported on 02/12/2022    [provider]  Multiple Vitamin (MULTI-VITAMIN) tablet Take 1 tablet by mouth daily. Patient  not taking: Reported on 02/12/2022    [provider]  ondansetron (ZOFRAN-ODT) 4 MG disintegrating tablet Take 1 tablet (4 mg total) by mouth every 6 (six) hours as needed for nausea or vomiting. 01/18/22   Ward, Delice Bison, DO  polyethylene glycol powder (GLYCOLAX/MIRALAX) 17 GM/SCOOP powder Take by mouth.    [provider]  pravastatin (PRAVACHOL) 40 MG tablet Take 40 mg by mouth at bedtime.  Patient not taking: Reported on 02/12/2022 06/09/15   [provider]  promethazine (PHENERGAN) 25 MG suppository SMARTSIG:1 SUPPOS Rectally Every Night PRN 01/28/22   [provider]    Physical Exam: Vitals:   02/12/22 0426 02/12/22 0427 02/12/22 0500 02/12/22 0724  BP: (!) 150/94  (!) 175/92 (!) 169/88  Pulse: 90  88 83  Resp: '16  19 20  '$ Temp: 98 F (36.7 C)   (!) 97.5 F (36.4 C)  TempSrc: Oral   Oral  SpO2: 98%  100% 100%  Weight:  49 kg    Height:  5' (1.524 m)      General: Not in acute distress.  Dry mucous membrane HEENT:       Eyes: PERRL, EOMI, no scleral icterus.       ENT: No discharge from the ears and nose, no pharynx injection, no tonsillar enlargement.        Neck: No JVD, no bruit, no mass felt. Heme: No neck lymph node enlargement. Cardiac: S1/S2, RRR, No murmurs, No gallops or rubs. Respiratory: No rales, wheezing, rhonchi or rubs. GI: Soft, nondistended, has RUQ tenderness, no rebound pain, no organomegaly, BS present. GU: No hematuria Ext: No pitting leg edema bilaterally. 1+DP/PT pulse bilaterally. Musculoskeletal: No joint deformities, No joint redness or warmth, no limitation of ROM in spin. Skin: No rashes.  Neuro: Alert, oriented X3, cranial nerves II-XII grossly intact, moves all extremities normally. Psych: Patient is not psychotic, no suicidal or hemocidal ideation.  Labs on Admission: I have personally reviewed following labs and imaging studies  CBC: Recent Labs  Lab 02/12/22 0432  WBC 10.2  HGB 12.6  HCT 37.0  MCV 86.7  PLT 542*   Basic Metabolic Panel: Recent Labs  Lab 02/12/22 0432 02/12/22 0449 02/12/22 0851  NA 126* 126* 122*  K 3.7 3.8 3.9  CL 92* 93* 91*  CO2 25 21* 24  GLUCOSE 113* 109* 110*  BUN 7* 7* 6*  CREATININE 0.58 0.57 0.49  CALCIUM 9.2 9.2 8.7*   GFR: Estimated Creatinine Clearance: 39.6 mL/min (by C-G formula based on SCr of 0.49 mg/dL). Liver Function Tests: Recent Labs  Lab 02/12/22 0432  AST 26  ALT 19  ALKPHOS 57  BILITOT 0.8  PROT 7.0  ALBUMIN 4.1   Recent Labs  Lab 02/12/22 0432  LIPASE 35   No results for input(s): "AMMONIA" in the last 168 hours. Coagulation Profile: Recent Labs  Lab 02/12/22 0449  INR 1.0   Cardiac Enzymes: No results for input(s): "CKTOTAL", "CKMB", "CKMBINDEX", "TROPONINI" in the last 168 hours. BNP (last 3 results) No results for input(s): "PROBNP" in the last 8760 hours. HbA1C: No results for input(s): "HGBA1C" in the last 72  hours. CBG: Recent Labs  Lab 02/12/22 0445  GLUCAP 111*   Lipid Profile: No results for input(s): "CHOL", "HDL", "LDLCALC", "TRIG", "CHOLHDL", "LDLDIRECT" in the last 72 hours. Thyroid Function Tests: Recent Labs    02/12/22 0449  TSH 2.026  FREET4 1.44*   Anemia Panel: No results for input(s): "VITAMINB12", "FOLATE", "FERRITIN", "TIBC", "IRON", "  RETICCTPCT" in the last 72 hours. Urine analysis:    Component Value Date/Time   COLORURINE STRAW (A) 02/12/2022 0622   APPEARANCEUR HAZY (A) 02/12/2022 0622   LABSPEC 1.023 02/12/2022 0622   PHURINE 8.0 02/12/2022 0622   GLUCOSEU 50 (A) 02/12/2022 0622   HGBUR SMALL (A) 02/12/2022 0622   BILIRUBINUR NEGATIVE 02/12/2022 0622   KETONESUR 5 (A) 02/12/2022 0622   PROTEINUR NEGATIVE 02/12/2022 0622   NITRITE NEGATIVE 02/12/2022 0622   LEUKOCYTESUR MODERATE (A) 02/12/2022 0622   Sepsis Labs: '@LABRCNTIP'$ (procalcitonin:4,lacticidven:4) ) No results found for this or any previous visit (from the past 240 hour(s)).    Radiological Exams on Admission: MR BRAIN WO CONTRAST  Result Date: 02/12/2022 CLINICAL DATA:  81 year old female with nausea, vomiting, abdominal pain, altered mental status. EXAM: MRI HEAD WITHOUT CONTRAST TECHNIQUE: Multiplanar, multiecho pulse sequences of the brain and surrounding structures were obtained without intravenous contrast. COMPARISON:  Head CT earlier today. FINDINGS: Brain: No restricted diffusion to suggest acute infarction. No midline shift, mass effect, evidence of mass lesion, ventriculomegaly, extra-axial collection or acute intracranial hemorrhage. Cervicomedullary junction and pituitary are within normal limits. Patchy bilateral scattered periventricular and subcortical white matter T2 and FLAIR hyperintensity is fairly mild for age. No cortical encephalomalacia or chronic cerebral blood products. Deep gray matter nuclei, brainstem and cerebellum are within normal limits for age. Vascular: Major  intracranial vascular flow voids are preserved, the distal right vertebral artery appears dominant. Skull and upper cervical spine: Degenerative ligamentous hypertrophy about the odontoid. Otherwise negative for age visible cervical spine. Visualized bone marrow signal is within normal limits. Sinuses/Orbits: Negative.  Postoperative changes to both globes. Other: Visible internal auditory structures appear normal. Mastoids are clear. Negative visible scalp and face. IMPRESSION: 1. No acute intracranial abnormality. 2. Mild for age nonspecific cerebral white matter signal changes, most commonly due to chronic small vessel disease. Electronically Signed   By: Genevie Ann M.D.   On: 02/12/2022 11:01   CT Angio Chest/Abd/Pel for Dissection W and/or W/WO  Result Date: 02/12/2022 CLINICAL DATA:  81 year old female with right upper quadrant pain radiating between the shoulder blades. Known cholelithiasis. Nausea and vomiting. EXAM: CT ANGIOGRAPHY CHEST, ABDOMEN AND PELVIS TECHNIQUE: Non-contrast CT of the chest was initially obtained. Multidetector CT imaging through the chest, abdomen and pelvis was performed using the standard protocol during bolus administration of intravenous contrast. Multiplanar reconstructed images and MIPs were obtained and reviewed to evaluate the vascular anatomy. RADIATION DOSE REDUCTION: This exam was performed according to the departmental dose-optimization program which includes automated exposure control, adjustment of the mA and/or kV according to patient size and/or use of iterative reconstruction technique. CONTRAST:  163m OMNIPAQUE IOHEXOL 350 MG/ML SOLN COMPARISON:  CT Abdomen and Pelvis 01/13/2022. Noncontrast chest CT 09/17/2014. FINDINGS: CTA CHEST FINDINGS Cardiovascular: Chronic calcified coronary artery and aortic atherosclerosis. No cardiomegaly or pericardial effusion. Following contrast there is no thoracic aortic dissection or aneurysm. Chronic mild tortuosity of the aortic  arch. Bilateral pulmonary arteries also are enhancing and appear to be patent. Mediastinum/Nodes: Negative. No mediastinal mass or lymphadenopathy. Lungs/Pleura: Fairly stable lung volumes since 2016. Major airways are patent. Both lungs are stable and essentially clear aside from mild chronic lung base scarring or atelectasis. No pleural effusion. Musculoskeletal: 2016 mid sternal fracture appears healed. Stable thoracic vertebral height and alignment. Visible shoulder osseous structures appear intact. No acute osseous abnormality identified. Chronic dystrophic calcifications of the left breast (series 7, image 106) were partially visible in 2016 and appear stable. Review  of the MIP images confirms the above findings. CTA ABDOMEN AND PELVIS FINDINGS VASCULAR Mild for age Aortoiliac calcified atherosclerosis. Negative for abdominal aortic aneurysm or dissection. Major arterial branches in the abdomen and pelvis are patent. Review of the MIP images confirms the above findings. NON-VASCULAR Hepatobiliary: Negative liver. Cholelithiasis not evident by CT. Gallbladder is mildly distended but with no convincing pericholecystic inflammation. 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 Spleen: Negative. Adrenals/Urinary Tract: Negative.  Pelvic phleboliths. Stomach/Bowel: Redundant sigmoid colon distended with combined gas and stool at the pelvic inlet and in the pelvis. Extensive diverticulosis of the proximal sigmoid. And extensive diverticulosis of the descending colon. No active inflammation is evident in those segments. Redundant splenic flexure is decompressed. Transverse and right colon appear stable and negative. Appendix not clearly delineated but no pericecal inflammation. No dilated small bowel. Decompressed stomach and duodenum. No free air or free fluid identified. No  mesenteric inflammation identified. Lymphatic: No lymphadenopathy identified. Reproductive: Absent uterus.  Diminutive or absent ovaries. Other: No pelvic free fluid. Musculoskeletal: Stable visualized osseous structures. Advanced lumbar spine disc and posterior element degeneration. Review of the MIP images confirms the above findings. IMPRESSION: 1. Aortic Atherosclerosis (ICD10-I70.0), although mild for age. Negative for abdominal aortic aneurysm or dissection. No pulmonary artery embolism identified. 2. No acute or inflammatory process identified in the chest, abdomen, or pelvis. Cholelithiasis not evident by CT. Extensive large bowel diverticulosis. Electronically Signed   By: Genevie Ann M.D.   On: 02/12/2022 05:42   CT Head Wo Contrast  Result Date: 02/12/2022 CLINICAL DATA:  81 year old female with right upper quadrant pain radiating between the shoulder blades. Known cholelithiasis. Nausea and vomiting. altered mental status. EXAM: CT HEAD WITHOUT CONTRAST TECHNIQUE: Contiguous axial images were obtained from the base of the skull through the vertex without intravenous contrast. RADIATION DOSE REDUCTION: This exam was performed according to the departmental dose-optimization program which includes automated exposure control, adjustment of the mA and/or kV according to patient size and/or use of iterative reconstruction technique. COMPARISON:  Head CT 01/13/2022. FINDINGS: Brain: Stable cerebral volume. No midline shift, ventriculomegaly, mass effect, evidence of mass lesion, intracranial hemorrhage or evidence of cortically based acute infarction. Minimal to mild for age cerebral white matter hypodensity is stable, more apparent in the left hemisphere. Otherwise normal gray-white differentiation. Vascular: Calcified atherosclerosis at the skull base. No suspicious intracranial vascular hyperdensity. Skull: No acute osseous abnormality identified. Sinuses/Orbits: Visualized paranasal sinuses and mastoids are  stable and well aerated. Other: No acute orbit or scalp soft tissue finding. IMPRESSION: No acute intracranial abnormality. Mild for age cerebral white matter changes. Electronically Signed   By: Genevie Ann M.D.   On: 02/12/2022 05:30      Assessment/Plan Principal Problem:   Hyponatremia Active Problems:   Syncope   Cholelithiasis   Abdominal pain   History of CVA (cerebrovascular accident)   Hypertension   HLD (hyperlipidemia)   UTI (urinary tract infection)   Hypothyroidism, adult   Esophagitis   Assessment and Plan: * Hyponatremia Na 126.  Etiology is not clear.  May be due to poor oral intake and dehydration.  Other differential diagnosis includes SIADH and thyroid dysfunction (patient has history of hypothyroidism, but not taking medications) -Admitted to telemetry bed as inpatient - Will check urine sodium, urine osmolality, serum osmolality. - check TSH, free T4 - Fluid restriction - IVF: 1L LR  in ED, will continue with IV normal saline at 75 mL/h - f/u by BMP q6h - avoid over correction too fast due to risk of central pontine myelinolysis   Addendum: the repeated BMP showed Na is 122, worsening. TSH normal 2.206, Free T4 is 1.44 which is high. -will give another 500 cc of NS bolus -start sodium Chloride tablet 1g bid -increase NS to 100 cc/h - strict In/Out  -consulted Dr. Joylene John of renal    Syncope Etiology is not clear. The differential diagnosis is broad, including vasovagal syncope, TIA/stroke, cardiac arrhythmia, ACS (less likely, given no chest pain and normal trop), orthostatic status, ect.  - Orthostatic vital signs  - f/u MRI-brain - 2d echo - Neuro checks  - IVF: as above - PT/OT eval and treat  Cholelithiasis Right upper quadrant ultrasound on 01/18/2022 showed tiny gallstone, without evidence of cholecystitis.  Today patient does not have fever or leukocytosis.  Lipase normal 35, liver function normal.  -Supportive care -As needed morphine,  Percocet, Tylenol -As needed Zofran -IV fluid as above - consulted Dr. Milas Gain of surgery  Abdominal pain - Likely due to gallstone.  CTA negative for dissection. -Supportive care as above  History of CVA (cerebrovascular accident) Stroke is listed in medical problem list, but the patient is not aware of this diagnosis.  Patient is not taking aspirin and her pravastatin currently. -Will not start aspirin due to possible esophagitis -Follow-up MRI of brain    Hypertension Patient's not taking Cozaar -Will not start Cozaar due to hyponatremia -IV hydralazine as needed  HLD (hyperlipidemia) Patient is not taking pravastatin -Follow-up with PCP  UTI (urinary tract infection) - IV Rocephin -Follow-up urine culture  Hypothyroidism, adult Patient is not taking medications currently -Follow-up TSH and free T4 level  Esophagitis - Continue Diflucan -Continue Protonix and Carafate          DVT ppx: SQ Lovenox  Code Status:  Partial code (I discussed with the patient in the presence of her son and explained the meaning of CODE STATUS, patient wants to be partial code, OK for CPR, but no intubation).  Family Communication:   Yes, patient's son   at bed side.     Disposition Plan:  Anticipate discharge back to previous environment  Consults called: consulted Dr. Milas Gain of surgery. -consulted Dr. Joylene John of renal  Admission status and Level of care: Telemetry Medical:    as inpt      Severity of Illness:  The appropriate patient status for this patient is INPATIENT. Inpatient status is judged to be reasonable and necessary in order to provide the required intensity of service to ensure the patient's safety. The patient's presenting symptoms, physical exam findings, and initial radiographic and laboratory data in the context of their chronic comorbidities is felt to place them at high risk for further clinical deterioration. Furthermore, it is not anticipated that  the patient will be medically stable for discharge from the hospital within 2 midnights of admission.   * I certify that at the point of admission it is my clinical judgment that the patient will require inpatient hospital care spanning beyond 2 midnights from the point of admission due to high intensity of service, high risk for further deterioration and high frequency of surveillance required.*       Date of Service 02/12/2022    Ivor Costa [facility]   If 7PM-7AM, please contact night-coverage [email or web address] 02/12/2022, 1:00 PM

## 2022-02-12 NOTE — Assessment & Plan Note (Signed)
Patient is not taking pravastatin -Follow-up with PCP

## 2022-02-12 NOTE — ED Notes (Signed)
Advised nurse that patient has ready bed 

## 2022-02-12 NOTE — Assessment & Plan Note (Addendum)
Will restart Cozaar.  --IV hydralazine as needed for now

## 2022-02-12 NOTE — Assessment & Plan Note (Addendum)
Stroke is listed in medical problem list, but the patient is not aware of having prior stroke.   Not taking aspirin or pravastatin currently. MRI brain no acute findings, mild small vessel ischemic diesase. --Hold off aspirin due to possible esophagitis

## 2022-02-12 NOTE — Assessment & Plan Note (Addendum)
Patient is not taking medications currently TSH normal. Free T4 mildly elevated. PCP follow up.

## 2022-02-12 NOTE — ED Notes (Signed)
Pt taken off bedpan. No BM at this time. Purewic replaced and secured with brief. Pt and family deny further needs at this time.

## 2022-02-12 NOTE — ED Provider Notes (Signed)
[facility] Provider Note    Event Date/Time   First MD Initiated Contact with Patient 02/12/22 [phone]     (approximate)   History   Abdominal Pain   HPI  Level 5 caveat:  history/ROS limited by acute/critical illness  [name] [name] is a 81 y.o. female whose medical history includes prior history of breast cancer and CVA as well as known cholelithiasis.  She was planning on outpatient follow-up with surgery for cholecystectomy, but her sodium has been low so it has been delayed.  She presented tonight for evaluation of persistent right upper quadrant pain that is worse now and going between her shoulder blades.  She has had nausea but no vomiting.  Of note, she was checked into triage and they were in the process of getting an EKG when the patient suddenly lost consciousness and began sonorous respirations.  She was brought emergently back to her room where I saw her unresponsive in a chair and we transferred immediately to stretcher where she regained consciousness and was again able to talk to me.  No witnessed seizure-like activity.  She says that she has pain in her shoulder blades and in her abdomen.     Physical Exam   Triage Vital Signs: ED Triage Vitals  Enc Vitals Group     BP 02/12/22 0426 (!) 150/94     Pulse Rate 02/12/22 0426 90     Resp 02/12/22 0426 16     Temp 02/12/22 0426 98 F (36.7 C)     Temp Source 02/12/22 0426 Oral     SpO2 02/12/22 0426 98 %     Weight 02/12/22 0427 49 kg (108 lb)     Height 02/12/22 0427 1.524 m (5')     Head Circumference --      Peak Flow --      Pain Score 02/12/22 0427 10     Pain Loc --      Pain Edu? --      Excl. in [place]? --     Most recent vital signs: Vitals:   02/12/22 0500 02/12/22 0724  BP: (!) 175/92 (!) 169/88  Pulse: 88 83  Resp: 19 20  Temp:  (!) 97.5 F (36.4 C)  SpO2: 100% 100%     General: Patient is confused after her syncopal episode and is very quiet and somewhat  altered, GCS 14, but was reportedly awake and alert and oriented x3 prior to the episode.   CV:  Good peripheral perfusion.  Normal heart sounds. Resp:  Normal effort.  Lungs are clear to auscultation bilaterally. Abd:  No distention.  No tenderness to palpation of the abdomen.  No abdominal bruit or pulsatile mass. Other:  No obvious focal neurological deficits after the patient regained consciousness.  She is moving all 4 extremities and has no visible facial droop.  Pupils are equal and reactive bilaterally.   ED Results / Procedures / Treatments   Labs (all labs ordered are listed, but only abnormal results are displayed) Labs Reviewed  COMPREHENSIVE METABOLIC PANEL - Abnormal; Notable for the following components:      Result Value   Sodium 126 (*)    Chloride 92 (*)    Glucose, Bld 113 (*)    BUN 7 (*)    All other components within normal limits  CBC - Abnormal; Notable for the following components:   Platelets 407 (*)    All other components within normal limits  URINALYSIS, ROUTINE  W REFLEX MICROSCOPIC - Abnormal; Notable for the following components:   Color, Urine STRAW (*)    APPearance HAZY (*)    Glucose, UA 50 (*)    Hgb urine dipstick SMALL (*)    Ketones, ur 5 (*)    Leukocytes,Ua MODERATE (*)    Bacteria, UA RARE (*)    All other components within normal limits  CBG MONITORING, ED - Abnormal; Notable for the following components:   Glucose-Capillary 111 (*)    All other components within normal limits  URINE CULTURE  LIPASE, BLOOD  LACTIC ACID, PLASMA  OSMOLALITY  OSMOLALITY, URINE  SODIUM, URINE, RANDOM  BASIC METABOLIC PANEL  BASIC METABOLIC PANEL  BASIC METABOLIC PANEL  PROTIME-INR  TSH  T4, FREE  TROPONIN I (HIGH SENSITIVITY)     EKG  ED ECG REPORT (obtained prior to syncopal episode) I, Hinda Kehr, the attending physician, personally viewed and interpreted this ECG.  Date: 02/12/2022 EKG Time: 4:38 AM Rate: 76 Rhythm: normal sinus  rhythm QRS Axis: normal Intervals: normal ST/T Wave abnormalities: normal Narrative Interpretation: no evidence of acute ischemia   ED ECG REPORT (obtained after syncopal episode) I, Hinda Kehr, the attending physician, personally viewed and interpreted this ECG.  Date: 02/12/2022 EKG Time: 4:47 AM Rate: 93 Rhythm: normal sinus rhythm QRS Axis: normal Intervals: normal ST/T Wave abnormalities: normal Narrative Interpretation: no evidence of acute ischemia    RADIOLOGY As documented below, no acute or emergent abnormality identified on CTA chest/abdomen/pelvis    PROCEDURES:  Critical Care performed: Yes, see critical care procedure note(s)  .1-3 Lead EKG Interpretation  Performed by: Hinda Kehr, MD Authorized by: Hinda Kehr, MD     Interpretation: normal     ECG rate:  75   ECG rate assessment: normal     Rhythm: sinus rhythm     Ectopy: none     Conduction: normal   .Critical Care  Performed by: Hinda Kehr, MD Authorized by: Hinda Kehr, MD   Critical care provider statement:    Critical care time (minutes):  30   Critical care time was exclusive of:  Separately billable procedures and treating other patients   Critical care was necessary to treat or prevent imminent or life-threatening deterioration of the following conditions:  Circulatory failure and CNS failure or compromise   Critical care was time spent personally by me on the following activities:  Development of treatment plan with patient or surrogate, evaluation of patient's response to treatment, examination of patient, obtaining history from patient or surrogate, ordering and performing treatments and interventions, ordering and review of laboratory studies, ordering and review of radiographic studies, pulse oximetry, re-evaluation of patient's condition and review of old charts    MEDICATIONS ORDERED IN ED: Medications  morphine (PF) 2 MG/ML injection 0.5 mg (has no administration in  time range)  ondansetron (ZOFRAN) injection 4 mg (has no administration in time range)  0.9 %  sodium chloride infusion ( Intravenous New Bag/Given 02/12/22 0729)  hydrALAZINE (APRESOLINE) injection 5 mg (has no administration in time range)  acetaminophen (TYLENOL) tablet 650 mg (has no administration in time range)  enoxaparin (LOVENOX) injection 40 mg (has no administration in time range)  oxyCODONE-acetaminophen (PERCOCET/ROXICET) 5-325 MG per tablet 1 tablet (has no administration in time range)  ondansetron (ZOFRAN) injection 4 mg (4 mg Intravenous Given 02/12/22 0454)  lactated ringers bolus 1,000 mL (0 mLs Intravenous Stopped 02/12/22 0621)  iohexol (OMNIPAQUE) 350 MG/ML injection 100 mL (100 mLs Intravenous Contrast Given  02/12/22 1601)     IMPRESSION / MDM / ASSESSMENT AND PLAN / ED COURSE  I reviewed the triage vital signs and the nursing notes.                              Differential diagnosis includes, but is not limited to, AAS, nonspecific but potentially life-threatening cardiac arrhythmia, ACS, CVA, electrolyte or metabolic abnormality, seizure.  Patient's presentation is most consistent with acute presentation with potential threat to life or bodily function.  The patient is hypertensive and spite of her witnessed syncopal episode.  The episode did not occur while she was changing position or while she was having blood drawn or was straining in any way.  I am concerned for cardiac arrhythmia or AAS, particularly given her hypertension and her report of pain between her shoulder blades as well as in her abdomen.  I reviewed her medical record and see that she is not recently had any kidney dysfunction.  I ordered emergent chest/abdomen/pelvis and authorized proceeding with the scan without waiting for labs.  Labs/studies ordered include EKGs before and after her syncopal episode, CTA as described above, urinalysis, comprehensive metabolic panel, CBG, lipase, CBC,  high-sensitivity troponin, lactic acid.  I put patient on 2 L of oxygen for respiratory support in case she syncopized again and requires intubation.  Patient's presentation is concerning for emergent condition that may deteriorate or recur quickly and she will need hospitalization regardless of the results of the scan.  The patient is on the cardiac monitor to evaluate for evidence of arrhythmia and/or significant heart rate changes.   Clinical Course as of 02/12/22 0750  Sun Feb 12, 2022  0932 CT Angio Chest/Abd/Pel for Dissection W and/or W/WO I viewed and interpreted the patient's CTA chest/abdomen/pelvis.  I see no obvious aortic dissection or other explanation for the patient's syncopal episodes or pain.  Radiology report agrees, there is no emergent or acute life-threatening condition including AAS. [CF]  [id number] Evaluation generally reassuring.  No obvious lab abnormalities other than gradually worsening hyponatremia.  Patient remains hypertensive but I am reluctant to provide antihypertensives given her syncopal episode.  She had no seizure-like activity and her sodium is not critically low, just low, and I would not expect her syncope to be the result of the hyponatremia.  I talked with the patient and the family and they agreed with the plan for hospitalization given the concerning episode that happened in triage.  I am consulting the hospitalist for admission. [CF]  [id number] Consulted by secure chat text with Dr. Sidney Ace with the hospitalist service who will admit [CF]    Clinical Course User Index [CF] Hinda Kehr, MD     FINAL CLINICAL IMPRESSION(S) / ED DIAGNOSES   Final diagnoses:  Syncope and collapse  Hypertension, unspecified type  Generalized pain  Hyponatremia     Rx / DC Orders   ED Discharge Orders     None        Note:  This document was prepared using Dragon voice recognition software and may include unintentional dictation errors.   Hinda Kehr,  MD 02/12/22 [phone]

## 2022-02-12 NOTE — Assessment & Plan Note (Addendum)
Empiric IV Rocephin Urine culture has had polymicrobial growth. IV antibiotics have been stopped.

## 2022-02-12 NOTE — Progress Notes (Signed)
*  PRELIMINARY RESULTS* Echocardiogram 2D Echocardiogram has been performed.  [name] [name] 02/12/2022, 2:18 PM

## 2022-02-12 NOTE — Consult Note (Signed)
[facility] CONSULT NOTE    Date: 02/12/2022                  Patient Name:  [name] [name]  MRN: [id number]  DOB: [birth year]  Age / Sex: 81 y.o., female         PCP: Idelle Crouch, MD                 Service Requesting Consult: Medicine                  Reason for Consult: Hyponatremia             History of Present Illness: Patient is a 81 y.o. female with a PMHx of hypertension, coronary artery disease, hyperlipidemia, history of CVA, history of breast cancer s/p radiation and hysterectomy, diverticulosis and postherpetic neuralgia who is now admitted with history of abdominal pain and a syncopal episode.  Patient has history of cholelithiasis and was supposed to go for cholecystectomy.  Surgery was postponed secondary to her hyponatremia.  Patient is now admitted with history of abdominal pain and was found to have a sodium of 126.  She was given IV fluids with Ringer's lactate and normal saline.  The sodium further dropped to 122 and the renal evaluation is requested.  Patient tells me that she has been drinking a lot of water.  She denies any use of diuretics.  Her mentation is normal at this time.  Medications: Outpatient medications: (Not in a hospital admission)   Discontinued Meds:   Medications Discontinued During This Encounter  Medication Reason   pantoprazole (PROTONIX) 40 MG tablet    hydrALAZINE (APRESOLINE) injection 5 mg    sodium chloride tablet 1 g    sodium chloride tablet 1 g     Current medications: Current Facility-Administered Medications  Medication Dose Route Frequency Provider Last Rate Last Admin   0.9 %  sodium chloride infusion   Intravenous Continuous Ivor Costa, MD 100 mL/hr at 02/12/22 1112 100 mL/hr at 02/12/22 1112   acetaminophen (TYLENOL) tablet 650 mg  650 mg Oral Q6H PRN Ivor Costa, MD       alum & mag hydroxide-simeth (MAALOX/MYLANTA) 200-200-20 MG/5ML suspension 30 mL  30 mL Oral Q6H PRN Ivor Costa, MD        cefTRIAXone (ROCEPHIN) 1 g in sodium chloride 0.9 % 100 mL IVPB  1 g Intravenous Q24H Ivor Costa, MD   Stopped at 02/12/22 0921   enoxaparin (LOVENOX) injection 40 mg  40 mg Subcutaneous Q24H Ivor Costa, MD       fluconazole (DIFLUCAN) tablet 100 mg  100 mg Oral Daily Ivor Costa, MD   100 mg at 02/12/22 0867   hydrALAZINE (APRESOLINE) injection 5 mg  5 mg Intravenous Q2H PRN Ivor Costa, MD       morphine (PF) 2 MG/ML injection 0.5 mg  0.5 mg Intravenous Q3H PRN Ivor Costa, MD       ondansetron (ZOFRAN) injection 4 mg  4 mg Intravenous Q8H PRN Ivor Costa, MD       oxyCODONE-acetaminophen (PERCOCET/ROXICET) 5-325 MG per tablet 1 tablet  1 tablet Oral Q4H PRN Ivor Costa, MD   1 tablet at 02/12/22 0922   pantoprazole (PROTONIX) EC tablet 40 mg  40 mg Oral Daily Ivor Costa, MD   40 mg at 02/12/22 6195   senna (SENOKOT) tablet 8.6 mg  1 tablet Oral QHS PRN Ivor Costa, MD       sodium  chloride tablet 1 g  1 g Oral BID WC Ivor Costa, MD   1 g at 02/12/22 1110   sucralfate (CARAFATE) tablet 1 g  1 g Oral QID Ivor Costa, MD   1 g at 02/12/22 1610   Current Outpatient Medications  Medication Sig Dispense Refill   Alum Hydroxide-Mag Carbonate 160-105 MG CHEW Chew 2 tablets by mouth daily as needed.     diclofenac Sodium (VOLTAREN) 1 % GEL Apply 2 g topically 4 (four) times daily.     fluconazole (DIFLUCAN) 100 MG tablet Take 100 mg by mouth daily.     pantoprazole (PROTONIX) 40 MG tablet Take by mouth.     senna (SENOKOT) 8.6 MG tablet Take 1 tablet by mouth daily.     simethicone (MYLICON) 80 MG chewable tablet Chew 160 mg by mouth 4 (four) times daily as needed.     sucralfate (CARAFATE) 1 g tablet Take 1 tablet (1 g total) by mouth 4 (four) times daily. 120 tablet 1   aspirin EC 81 MG tablet Take 81 mg by mouth daily. (Patient not taking: Reported on 02/12/2022)     losartan (COZAAR) 25 MG tablet Take 25 mg by mouth daily. (Patient not taking: Reported on 02/12/2022)     Multiple Vitamin  (MULTI-VITAMIN) tablet Take 1 tablet by mouth daily. (Patient not taking: Reported on 02/12/2022)     ondansetron (ZOFRAN-ODT) 4 MG disintegrating tablet Take 1 tablet (4 mg total) by mouth every 6 (six) hours as needed for nausea or vomiting. 20 tablet 0   polyethylene glycol powder (GLYCOLAX/MIRALAX) 17 GM/SCOOP powder Take by mouth.     pravastatin (PRAVACHOL) 40 MG tablet Take 40 mg by mouth at bedtime.  (Patient not taking: Reported on 02/12/2022)     promethazine (PHENERGAN) 25 MG suppository SMARTSIG:1 SUPPOS Rectally Every Night PRN        Allergies: Allergies  Allergen Reactions   Levofloxacin Other (See Comments)    Causes muscle problems.   Demerol [Meperidine] Nausea Only   Other Nausea And Vomiting   Sulfa Antibiotics Nausea Only      Past Medical History: Past Medical History:  Diagnosis Date   Blood transfusion without reported diagnosis    Breast cancer ([place]) 2009   left breast, radiation   Diverticulosis    Diverticulosis    H/O bone density study    05/30/06; 06/04/08; 06/05/12   Hemorrhoids    History of bladder infections    History of chicken pox    History of CVA (cerebrovascular accident)    Hyperlipidemia    Hypertension    Personal history of radiation therapy    PONV (postoperative nausea and vomiting)    SCC (squamous cell carcinoma) 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   Trigger finger      Past Surgical History: Past Surgical History:  Procedure Laterality Date   ABDOMINAL HYSTERECTOMY     APPENDECTOMY  1954   BREAST BIOPSY Left 1986   neg   BREAST BIOPSY Left 1988   neg   BREAST BIOPSY Left 1995   neg   BREAST BIOPSY Left 2009   positive (ultrasound guided biopsy)   BREAST LUMPECTOMY     BROW LIFT  02/18/2021   Procedure: BLEPHAROPTOSIS REPAIR; RESECT EX BILATERAL;  Surgeon: Karle Starch, MD;  Location: [place];   Service: Ophthalmology;;   COLONOSCOPY  07/31/2013  COLONOSCOPY WITH PROPOFOL N/A 01/10/2016   Procedure: COLONOSCOPY WITH PROPOFOL;  Surgeon: Manya Silvas, MD;  Location: [facility] ENDOSCOPY;  Service: Endoscopy;  Laterality: N/A;   ESOPHAGOGASTRODUODENOSCOPY  1/292015   ESOPHAGOGASTRODUODENOSCOPY N/A 01/09/2016   Procedure: ESOPHAGOGASTRODUODENOSCOPY (EGD);  Surgeon: Manya Silvas, MD;  [facility] ENDOSCOPY;  Service: Endoscopy;  Laterality: N/A;   ESOPHAGOGASTRODUODENOSCOPY (EGD) WITH PROPOFOL N/A 02/02/2022   Procedure: ESOPHAGOGASTRODUODENOSCOPY (EGD) WITH PROPOFOL;  Surgeon: Annamaria Helling, DO;  Location: [place];  Service: Gastroenterology;  Laterality: N/A;   eye lid lift     [place]   WRIST SURGERY  2009, 2006   ganglion cysts removal     Family History: Family History  Problem Relation Age of Onset   Prostate cancer Brother    Crohn's disease Brother    Heart attack Father    Prostate cancer Father    Vaginal cancer Maternal Grandmother    Stroke Mother    Breast cancer Neg Hx      Social History: Social History   Socioeconomic History   Marital status: Widowed    Spouse name: Not on file   Number of children: Not on file   Years of education: Not on file   Highest education level: Not on file  Occupational History   Not on file  Tobacco Use   Smoking status: Never   Smokeless tobacco: Never  Vaping Use   Vaping Use: Never used  Substance and Sexual Activity   Alcohol use: No    Alcohol/week: 0.0 standard drinks of alcohol   Drug use: No   Sexual activity: Not on file  Other Topics Concern   Not on file  Social History Narrative   Not on file   Social Determinants of Health   Financial Resource Strain: Not on file  Food Insecurity: Not on file  Transportation Needs: Not on file  Physical Activity: Not on file  Stress: Not on file  Social Connections: Not on file  Intimate Partner  Violence: Not on file     Review of Systems: As per HPI  Vital Signs: Blood pressure 127/77, pulse 91, temperature 98.3 F (36.8 C), temperature source Oral, resp. rate 16, height 5' (1.524 m), weight 49 kg, SpO2 98 %.  Weight trends: Filed Weights   02/12/22 0427  Weight: 49 kg    Physical Exam: Physical Exam: General:  No acute distress  Head:  Normocephalic, atraumatic. Moist oral mucosal membranes  Eyes:  Anicteric  Neck:  Supple  Lungs:   Clear to auscultation, normal effort  Heart:  S1S2 no rubs  Abdomen:   Soft, nontender, bowel sounds present  Extremities:  peripheral edema.  Neurologic:  Awake, alert, following commands  Skin:  No lesions  Access:     Lab results:  Basic Metabolic Panel: Recent Labs  Lab 02/12/22 0432 02/12/22 0449 02/12/22 0851  NA 126* 126* 122*  K 3.7 3.8 3.9  CL 92* 93* 91*  CO2 25 21* 24  GLUCOSE 113* 109* 110*  BUN 7* 7* 6*  CREATININE 0.58 0.57 0.49  CALCIUM 9.2 9.2 8.7*    Creatinine  Date/Time Value Ref Range Status  10/05/2014 09:51 AM 0.65 mg/dL Final    Comment:    0.44-1.00 NOTE: New Reference Range  09/08/14   10/01/2013 10:10 AM 0.71 0.60 - 1.30 mg/dL Final  10/01/2012 09:55 AM 0.87 0.60 - 1.30 mg/dL Final  04/02/2012 09:48 AM 0.76 0.60 - 1.30 mg/dL  Final  10/03/2011 11:17 AM 0.83 0.60 - 1.30 mg/dL Final   Creatinine, Ser  Date/Time Value Ref Range Status  02/12/2022 08:51 AM 0.49 0.44 - 1.00 mg/dL Final  02/12/2022 04:49 AM 0.57 0.44 - 1.00 mg/dL Final  02/12/2022 04:32 AM 0.58 0.44 - 1.00 mg/dL Final  01/18/2022 03:10 AM 0.60 0.44 - 1.00 mg/dL Final  01/13/2022 10:59 AM 0.78 0.44 - 1.00 mg/dL Final  12/18/2021 11:22 AM 0.71 0.44 - 1.00 mg/dL Final  11/02/2017 10:48 AM 0.70 0.44 - 1.00 mg/dL Final  11/03/2016 10:03 AM 0.70 0.44 - 1.00 mg/dL Final  01/08/2016 03:37 AM 0.69 0.44 - 1.00 mg/dL Final  01/07/2016 05:40 PM 0.70 0.44 - 1.00 mg/dL Final  11/04/2015 10:31 AM 0.63 0.44 - 1.00 mg/dL Final     CBC: Recent Labs  Lab 02/12/22 0432  WBC 10.2  HGB 12.6  HCT 37.0  MCV 86.7  PLT 407*    Microbiology: Results for orders placed or performed during the hospital encounter of 02/02/22  KOH prep     Status: None   Collection Time: 02/02/22 11:32 AM   Specimen: Esophagus  Result Value Ref Range Status   Specimen Description ESOPHAGUS  Final   Special Requests NONE  Final   KOH Prep   Final    YEAST WITH PSEUDOHYPHAE Performed at [facility], [address] [place], [place] [address]    Report Status 02/02/2022 FINAL  Final    Urinalysis: Recent Labs    02/12/22 0622  COLORURINE STRAW*  LABSPEC 1.023  PHURINE 8.0  GLUCOSEU 50*  HGBUR SMALL*  BILIRUBINUR NEGATIVE  KETONESUR 5*  PROTEINUR NEGATIVE  NITRITE NEGATIVE  LEUKOCYTESUR MODERATE*     Imaging:  MR BRAIN WO CONTRAST  Result Date: 02/12/2022 CLINICAL DATA:  81 year old female with nausea, vomiting, abdominal pain, altered mental status. EXAM: MRI HEAD WITHOUT CONTRAST TECHNIQUE: Multiplanar, multiecho pulse sequences of the brain and surrounding structures were obtained without intravenous contrast. COMPARISON:  Head CT earlier today. FINDINGS: Brain: No restricted diffusion to suggest acute infarction. No midline shift, mass effect, evidence of mass lesion, ventriculomegaly, extra-axial collection or acute intracranial hemorrhage. Cervicomedullary junction and pituitary are within normal limits. Patchy bilateral scattered periventricular and subcortical white matter T2 and FLAIR hyperintensity is fairly mild for age. No cortical encephalomalacia or chronic cerebral blood products. Deep gray matter nuclei, brainstem and cerebellum are within normal limits for age. Vascular: Major intracranial vascular flow voids are preserved, the distal right vertebral artery appears dominant. Skull and upper cervical spine: Degenerative ligamentous hypertrophy about the odontoid. Otherwise negative for age visible  cervical spine. Visualized bone marrow signal is within normal limits. Sinuses/Orbits: Negative.  Postoperative changes to both globes. Other: Visible internal auditory structures appear normal. Mastoids are clear. Negative visible scalp and face. IMPRESSION: 1. No acute intracranial abnormality. 2. Mild for age nonspecific cerebral white matter signal changes, most commonly due to chronic small vessel disease. Electronically Signed   By: Genevie Ann M.D.   On: 02/12/2022 11:01   CT Angio Chest/Abd/Pel for Dissection W and/or W/WO  Result Date: 02/12/2022 CLINICAL DATA:  81 year old female with right upper quadrant pain radiating between the shoulder blades. Known cholelithiasis. Nausea and vomiting. EXAM: CT ANGIOGRAPHY CHEST, ABDOMEN AND PELVIS TECHNIQUE: Non-contrast CT of the chest was initially obtained. Multidetector CT imaging through the chest, abdomen and pelvis was performed using the standard protocol during bolus administration of intravenous contrast. Multiplanar reconstructed images and MIPs were obtained and reviewed to evaluate the vascular  anatomy. RADIATION DOSE REDUCTION: This exam was performed according to the departmental dose-optimization program which includes automated exposure control, adjustment of the mA and/or kV according to patient size and/or use of iterative reconstruction technique. CONTRAST:  134m OMNIPAQUE IOHEXOL 350 MG/ML SOLN COMPARISON:  CT Abdomen and Pelvis 01/13/2022. Noncontrast chest CT 09/17/2014. FINDINGS: CTA CHEST FINDINGS Cardiovascular: Chronic calcified coronary artery and aortic atherosclerosis. No cardiomegaly or pericardial effusion. Following contrast there is no thoracic aortic dissection or aneurysm. Chronic mild tortuosity of the aortic arch. Bilateral pulmonary arteries also are enhancing and appear to be patent. Mediastinum/Nodes: Negative. No mediastinal mass or lymphadenopathy. Lungs/Pleura: Fairly stable lung volumes since 2016. Major airways are  patent. Both lungs are stable and essentially clear aside from mild chronic lung base scarring or atelectasis. No pleural effusion. Musculoskeletal: 2016 mid sternal fracture appears healed. Stable thoracic vertebral height and alignment. Visible shoulder osseous structures appear intact. No acute osseous abnormality identified. Chronic dystrophic calcifications of the left breast (series 7, image 106) were partially visible in 2016 and appear stable. Review of the MIP images confirms the above findings. CTA ABDOMEN AND PELVIS FINDINGS VASCULAR Mild for age Aortoiliac calcified atherosclerosis. Negative for abdominal aortic aneurysm or dissection. Major arterial branches in the abdomen and pelvis are patent. Review of the MIP images confirms the above findings. NON-VASCULAR Hepatobiliary: Negative liver. Cholelithiasis not evident by CT. Gallbladder is mildly distended but with no convincing pericholecystic inflammation. 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 Spleen: Negative. Adrenals/Urinary Tract: Negative.  Pelvic phleboliths. Stomach/Bowel: Redundant sigmoid colon distended with combined gas and stool at the pelvic inlet and in the pelvis. Extensive diverticulosis of the proximal sigmoid. And extensive diverticulosis of the descending colon. No active inflammation is evident in those segments. Redundant splenic flexure is decompressed. Transverse and right colon appear stable and negative. Appendix not clearly delineated but no pericecal inflammation. No dilated small bowel. Decompressed stomach and duodenum. No free air or free fluid identified. No mesenteric inflammation identified. Lymphatic: No lymphadenopathy identified. Reproductive: Absent uterus.  Diminutive or absent ovaries. Other: No pelvic free fluid. Musculoskeletal: Stable visualized osseous  structures. Advanced lumbar spine disc and posterior element degeneration. Review of the MIP images confirms the above findings. IMPRESSION: 1. Aortic Atherosclerosis (ICD10-I70.0), although mild for age. Negative for abdominal aortic aneurysm or dissection. No pulmonary artery embolism identified. 2. No acute or inflammatory process identified in the chest, abdomen, or pelvis. Cholelithiasis not evident by CT. Extensive large bowel diverticulosis. Electronically Signed   By: HGenevie AnnM.D.   On: 02/12/2022 05:42   CT Head Wo Contrast  Result Date: 02/12/2022 CLINICAL DATA:  81year old female with right upper quadrant pain radiating between the shoulder blades. Known cholelithiasis. Nausea and vomiting. altered mental status. EXAM: CT HEAD WITHOUT CONTRAST TECHNIQUE: Contiguous axial images were obtained from the base of the skull through the vertex without intravenous contrast. RADIATION DOSE REDUCTION: This exam was performed according to the departmental dose-optimization program which includes automated exposure control, adjustment of the mA and/or kV according to patient size and/or use of iterative reconstruction technique. COMPARISON:  Head CT 01/13/2022. FINDINGS: Brain: Stable cerebral volume. No midline shift, ventriculomegaly, mass effect, evidence of mass lesion, intracranial hemorrhage or evidence of cortically based acute infarction. Minimal to mild for age cerebral white matter hypodensity is stable, more apparent in the left hemisphere. Otherwise normal gray-white differentiation. Vascular: Calcified atherosclerosis  at the skull base. No suspicious intracranial vascular hyperdensity. Skull: No acute osseous abnormality identified. Sinuses/Orbits: Visualized paranasal sinuses and mastoids are stable and well aerated. Other: No acute orbit or scalp soft tissue finding. IMPRESSION: No acute intracranial abnormality. Mild for age cerebral white matter changes. Electronically Signed   By: Genevie Ann M.D.    On: 02/12/2022 05:30     Assessment & Plan:   Principal Problem:   Hyponatremia Active Problems:   Hypothyroidism, adult   Syncope   History of CVA (cerebrovascular accident)   Hypertension   HLD (hyperlipidemia)   Cholelithiasis   Abdominal pain   UTI (urinary tract infection)   Esophagitis  Patient is a 81 y.o. female with a PMHx of hypertension, coronary artery disease, hyperlipidemia, history of CVA, history of breast cancer s/p radiation and hysterectomy, diverticulosis and postherpetic neuralgia who is now admitted with history of abdominal pain and a syncopal episode.  Patient has history of cholelithiasis and was supposed to go for cholecystectomy.  Surgery was postponed secondary to her hyponatremia.  Patient is now admitted with history of abdominal pain and was found to have a sodium of 126.  She was given IV fluids with Ringer's lactate and normal saline.  The sodium further dropped to 122 and the renal evaluation is requested.    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  The further drop in sodium was possibly due to administration of dilutional IV fluids with Ringer's lactate.  We will check serum and urine osmolality along with uric acid and urine lytes.  We will continue the sodium chloride tablets along with isotonic hydration.  Her mentation is normal at this time and there is no need for hypertonic saline.  Spoke to the entire family at bedside in detail and explained the renal status.  Answered all their questions to their satisfaction.   LOS: 0 Lyla Son, MD [facility]. 8/13/20232:30 PM

## 2022-02-12 NOTE — Assessment & Plan Note (Addendum)
Etiology is not clear. The differential diagnosis is broad, including vasovagal syncope, TIA/stroke, cardiac arrhythmia, ACS (less likely, given no chest pain and normal trop), orthostatic status, etc).  MRI negative. Echo with normal EF, mild MR. -- Orthostatic vital signs  -- Neuro checks  --PT/OT --Telemetry

## 2022-02-12 NOTE — ED Triage Notes (Signed)
Pt to ED via EMS from home c/o RUQ pain for a couple months, pain radiating to back between shoulder blades now.  Pt with known gallstones and plan to remove gallbladder but sodium has been too low.  Nausea without vomiting.  Pt A&Ox4, chest rise even and unlabored, skin WNL and in NAD at this time.  EMS vitals 83 HR, 96% RA, 172/94 BP.

## 2022-02-12 NOTE — ED Notes (Signed)
Pt ambulatory to restroom with assistance.

## 2022-02-13 ENCOUNTER — Inpatient Hospital Stay: Payer: No Typology Code available for payment source

## 2022-02-13 DIAGNOSIS — E871 Hypo-osmolality and hyponatremia: Secondary | ICD-10-CM | POA: Diagnosis not present

## 2022-02-13 DIAGNOSIS — E876 Hypokalemia: Secondary | ICD-10-CM | POA: Diagnosis not present

## 2022-02-13 DIAGNOSIS — K8020 Calculus of gallbladder without cholecystitis without obstruction: Secondary | ICD-10-CM | POA: Diagnosis not present

## 2022-02-13 DIAGNOSIS — K8019 Calculus of gallbladder with other cholecystitis with obstruction: Secondary | ICD-10-CM | POA: Diagnosis not present

## 2022-02-13 DIAGNOSIS — R1011 Right upper quadrant pain: Secondary | ICD-10-CM | POA: Diagnosis not present

## 2022-02-13 DIAGNOSIS — R55 Syncope and collapse: Secondary | ICD-10-CM | POA: Diagnosis not present

## 2022-02-13 LAB — URINE CULTURE

## 2022-02-13 LAB — BASIC METABOLIC PANEL
Anion gap: 5 (ref 5–15)
Anion gap: 7 (ref 5–15)
Anion gap: 6 (ref 5–15)
Anion gap: 7 (ref 5–15)
BUN: 6 mg/dL — ABNORMAL LOW (ref 8–23)
BUN: 6 mg/dL — ABNORMAL LOW (ref 8–23)
BUN: 10 mg/dL (ref 8–23)
BUN: 8 mg/dL (ref 8–23)
CO2: 24 mmol/L (ref 22–32)
CO2: 24 mmol/L (ref 22–32)
CO2: 25 mmol/L (ref 22–32)
CO2: 24 mmol/L (ref 22–32)
Calcium: 8.6 mg/dL — ABNORMAL LOW (ref 8.9–10.3)
Calcium: 8.8 mg/dL — ABNORMAL LOW (ref 8.9–10.3)
Calcium: 8.4 mg/dL — ABNORMAL LOW (ref 8.9–10.3)
Calcium: 8.7 mg/dL — ABNORMAL LOW (ref 8.9–10.3)
Chloride: 102 mmol/L (ref 98–111)
Chloride: 102 mmol/L (ref 98–111)
Chloride: 103 mmol/L (ref 98–111)
Chloride: 99 mmol/L (ref 98–111)
Creatinine, Ser: 0.51 mg/dL (ref 0.44–1.00)
Creatinine, Ser: 0.56 mg/dL (ref 0.44–1.00)
Creatinine, Ser: 0.54 mg/dL (ref 0.44–1.00)
Creatinine, Ser: 0.58 mg/dL (ref 0.44–1.00)
GFR, Estimated: >60 mL/min (ref >60)
GFR, Estimated: >60 mL/min (ref >60)
GFR, Estimated: >60 mL/min (ref >60)
GFR, Estimated: >60 mL/min (ref >60)
Glucose, Bld: 89 mg/dL (ref 70–99)
Glucose, Bld: 90 mg/dL (ref 70–99)
Glucose, Bld: 129 mg/dL — ABNORMAL HIGH (ref 70–99)
Glucose, Bld: 157 mg/dL — ABNORMAL HIGH (ref 70–99)
Potassium: 3.6 mmol/L (ref 3.5–5.1)
Potassium: 3.7 mmol/L (ref 3.5–5.1)
Potassium: 3.3 mmol/L — ABNORMAL LOW (ref 3.5–5.1)
Potassium: 3.6 mmol/L (ref 3.5–5.1)
Sodium: 131 mmol/L — ABNORMAL LOW (ref 135–145)
Sodium: 133 mmol/L — ABNORMAL LOW (ref 135–145)
Sodium: 134 mmol/L — ABNORMAL LOW (ref 135–145)
Sodium: 130 mmol/L — ABNORMAL LOW (ref 135–145)

## 2022-02-13 LAB — ECHOCARDIOGRAM COMPLETE
AR max vel: 1.92 cm2
AV Peak grad: 3.9 mmHg
Ao pk vel: 0.99 m/s
Area-P 1/2: 4.99 cm2
Calc EF: 63.3 %
Height: 60 in
S' Lateral: 2.32 cm
Single Plane A2C EF: 71.6 %
Single Plane A4C EF: 58.7 %
Weight: 1728 oz

## 2022-02-13 MED ORDER — INDOCYANINE GREEN 25 MG IV SOLR
2.5000 mg | INTRAVENOUS | Status: AC
  Administered 2022-02-14: 2.5 mg via INTRAVENOUS
  Filled 2022-02-13: qty 1

## 2022-02-13 MED ORDER — SODIUM CHLORIDE 1 G PO TABS
1.0000 g | ORAL_TABLET | Freq: Two times a day (BID) | ORAL | Status: DC
  Administered 2022-02-13 – 2022-02-19 (×10): 1 g via ORAL
  Filled 2022-02-13 (×12): qty 1

## 2022-02-13 NOTE — Plan of Care (Signed)
  Problem: Education: Goal: Knowledge of General Education information will improve Description: Including pain rating scale, medication(s)/side effects and non-pharmacologic comfort measures Outcome: Progressing   Problem: Nutrition: Goal: Adequate nutrition will be maintained Outcome: Progressing   Problem: Activity: Goal: Risk for activity intolerance will decrease Outcome: Progressing   Problem: Elimination: Goal: Will not experience complications related to bowel motility Outcome: Progressing Goal: Will not experience complications related to urinary retention Outcome: Progressing   Problem: Pain Managment: Goal: General experience of comfort will improve Outcome: Progressing   Problem: Skin Integrity: Goal: Risk for impaired skin integrity will decrease Outcome: Progressing

## 2022-02-13 NOTE — Evaluation (Signed)
Occupational Therapy Evaluation Patient Details Name: [name] [name] MRN: [id number] DOB: [birth year] Today's Date: 02/13/2022   History of Present Illness Pt is 42 YOF admitted for syncope, hyponatremia, UTI, gallstones, and abdominal pain. PMH includes: HTN, HLD, CVA, hypothyroidism, breast CA, skin CA, diverticulosis, post herpetic neualgia, and esophagitis.   Clinical Impression   Patient seen for OT evaluation this date. Patient presenting with abdominal pain, decreased activity tolerance, and impaired balance. At baseline, pt was independent in ADLs, IADLs, and functional mobility without an AD. Pt is currently requiring set up A to supervision for ADLs, Min guard for functional transfers, and supervision for functional mobility with a RW. Pt is close to baseline level of function with ADLs, however, OT will continue to follow pt while in hospital to prevent further decline and work on implementing energy conservation techniques during ADLs. Pt lives with daughter and has someone available 24/7 to provide physical assistance as needed upon discharge. Patient will benefit from acute OT to increase overall independence in the areas of ADLs and functional mobility in order to safely discharge home.      Recommendations for follow up therapy are one component of a multi-disciplinary discharge planning process, led by the attending physician.  Recommendations may be updated based on patient status, additional functional criteria and insurance authorization.   Follow Up Recommendations  No OT follow up    Assistance Recommended at Discharge Set up Supervision/Assistance  Patient can return home with the following Help with stairs or ramp for entrance;A little help with bathing/dressing/bathroom;A little help with walking and/or transfers;Assistance with cooking/housework;Assist for transportation    Functional Status Assessment  Patient has had a recent decline in their functional status and  demonstrates the ability to make significant improvements in function in a reasonable and predictable amount of time.  Equipment Recommendations  None recommended by OT    Recommendations for Other Services       Precautions / Restrictions Precautions Precautions: Fall Restrictions Weight Bearing Restrictions: No      Mobility Bed Mobility               General bed mobility comments: Pt in recliner upon arrival Patient Response: Cooperative  Transfers Overall transfer level: Needs assistance Equipment used: Rolling walker (2 wheels) Transfers: Sit to/from Stand Sit to Stand: Min guard           General transfer comment: Pt completed STS from recliner with Min guard + RW      Balance Overall balance assessment: Needs assistance Sitting-balance support: Feet unsupported Sitting balance-Leahy Scale: Good     Standing balance support: Bilateral upper extremity supported, During functional activity, Reliant on assistive device for balance Standing balance-Leahy Scale: Fair                             ADL either performed or assessed with clinical judgement   ADL Overall ADL's : Needs assistance/impaired                         Toilet Transfer: Rolling walker (2 wheels);Grab bars;Min guard Toilet Transfer Details (indicate cue type and reason): Pt completed toilet transfer with Min guard + RW and L grab bar. Toileting- Clothing Manipulation and Hygiene: Supervision/safety;Sit to/from stand Toileting - Clothing Manipulation Details (indicate cue type and reason): Pt able to doff and hike underwear over B hips in standing with Min guard. Pt completed peri care in  sitting with supervision.     Functional mobility during ADLs: Supervision/safety;Rolling walker (2 wheels) General ADL Comments: Pt completed functional mobility to the bathroom with supervision + RW     Vision Baseline Vision/History: 1 Wears glasses Patient Visual Report: No  change from baseline                  Pertinent Vitals/Pain Pain Assessment Pain Assessment: No/denies pain     Hand Dominance     Extremity/Trunk Assessment Upper Extremity Assessment Upper Extremity Assessment: Overall WFL for tasks assessed   Lower Extremity Assessment Lower Extremity Assessment: Generalized weakness   Cervical / Trunk Assessment Cervical / Trunk Assessment: Normal   Communication Communication Communication: No difficulties   Cognition Arousal/Alertness: Awake/alert Behavior During Therapy: WFL for tasks assessed/performed Overall Cognitive Status: Within Functional Limits for tasks assessed                                                        Home Living Family/patient expects to be discharged to:: Private residence Living Arrangements: Children Available Help at Discharge: Family;Available 24 hours/day Type of Home: House Home Access: Stairs to enter [employer] of Steps: 2 STE Entrance Stairs-Rails: None Home Layout: One level     Bathroom Shower/Tub: Occupational psychologist: Handicapped height     Home Equipment: Conservation officer, nature (2 wheels);Cane - single point   Additional Comments: has AD but has not used them      Prior Functioning/Environment Prior Level of Function : Independent/Modified Independent             Mobility Comments: (I) w mobility ADLs Comments: (I) w ADLs/IADLS        OT Problem List: Decreased strength;Impaired vision/perception;Decreased activity tolerance;Impaired balance (sitting and/or standing);Pain      OT Treatment/Interventions: Self-care/ADL training;Therapeutic exercise;Patient/family education;Balance training;Energy conservation;Therapeutic activities    OT Goals(Current goals can be found in the care plan section) Acute Rehab OT Goals Patient Stated Goal: return home OT Goal Formulation: With patient/family Time For Goal Achievement:  02/27/22 Potential to Achieve Goals: Good ADL Goals Pt Will Perform Grooming: Independently;standing Pt Will Perform Upper Body Dressing: Independently;sitting Pt Will Perform Lower Body Dressing: Independently;sitting/lateral leans;sit to/from stand Pt Will Transfer to Toilet: Independently;regular height toilet;grab bars Pt Will Perform Toileting - Clothing Manipulation and hygiene: Independently;sit to/from stand Additional ADL Goal #1: Pt will provide teach back for 2-3 Energy conservation techniques  OT Frequency: Min 2X/week    Co-evaluation              AM-PAC OT "6 Clicks" Daily Activity     Outcome Measure Help from another person eating meals?: None Help from another person taking care of personal grooming?: A Little Help from another person toileting, which includes using toliet, bedpan, or urinal?: A Little Help from another person bathing (including washing, rinsing, drying)?: A Little Help from another person to put on and taking off regular upper body clothing?: None Help from another person to put on and taking off regular lower body clothing?: None 6 Click Score: 21   End of Session Equipment Utilized During Treatment: Gait belt;Rolling walker (2 wheels) Nurse Communication: Mobility status  Activity Tolerance: Patient tolerated treatment well Patient left: in chair;with call bell/phone within reach;with chair alarm set;with family/visitor present  OT Visit Diagnosis: Unsteadiness  on feet (R26.81);Muscle weakness (generalized) (M62.81);Pain                Time: 1950-9326 OT Time Calculation (min): 18 min Charges:  OT General Charges $OT Visit: 1 Visit OT Evaluation $OT Eval Low Complexity: 1 Low  [facility] MS, OTR/L ascom [phone]  02/13/22, 12:54 PM

## 2022-02-13 NOTE — TOC Progression Note (Signed)
Transition of Care [facility]) - Progression Note    Patient Details  Name: [name] [name] MRN: [id number] Date of Birth: 1940-12-20  Transition of Care [facility]) CM/SW [place], RN Phone Number: 02/13/2022, 9:18 AM  Clinical Narrative:     Patient coming from home, she has good family support, Awaiting Eval from PT/OT to make recommendation,  PCP is F[place] Reek, gets medication at [facility] to monitor for needs and assist      Expected Discharge Plan and Services                                                 Social Determinants of Health (SDOH) Interventions    Readmission Risk Interventions     No data to display

## 2022-02-13 NOTE — Evaluation (Signed)
Physical Therapy Evaluation Patient Details Name: [name] [name] MRN: [id number] DOB: [birth year] Today's Date: 02/13/2022  History of Present Illness  Pt is 43 YOF admitted for syncope, hyponatremia, UTI, gallstones, and abdominal pain. PMH includes: HTN, HLD, CVA, hypothyroidism, breast CA, skin CA, diverticulosis, post herpetic neualgia, and esophagitis.  Clinical Impression  Pt presents to PT in recliner and agreeable to participate in therapy services. Pt motivated and put forth good effort throughout session. Able to amb in room w CGA and no LOB. Both pt and pt's daughter both report significant improvements in pt's functional capacity since admission. Pt would benefit from skilled HHPT to address above deficits in functional mobility, strength, and activity tolerance to promote optimal return to PLOF. Current recommendation remains appropriate.         Recommendations for follow up therapy are one component of a multi-disciplinary discharge planning process, led by the attending physician.  Recommendations may be updated based on patient status, additional functional criteria and insurance authorization.  Follow Up Recommendations Home health PT      Assistance Recommended at Discharge Intermittent Supervision/Assistance  Patient can return home with the following  Assistance with cooking/housework;Assist for transportation;Help with stairs or ramp for entrance    Equipment Recommendations None recommended by PT  Recommendations for Other Services       Functional Status Assessment Patient has had a recent decline in their functional status and demonstrates the ability to make significant improvements in function in a reasonable and predictable amount of time.     Precautions / Restrictions Precautions Precautions: Fall Restrictions Weight Bearing Restrictions: No      Mobility  Bed Mobility               General bed mobility comments: Pt in recliner upon  arrival Patient Response: Cooperative  Transfers Overall transfer level: Needs assistance Equipment used: Rolling walker (2 wheels) Transfers: Sit to/from Stand Sit to Stand: Min guard                Ambulation/Gait Ambulation/Gait assistance: Min guard Gait Distance (Feet): 30 Feet Assistive device: Rolling walker (2 wheels) Gait Pattern/deviations: WFL(Within Functional Limits) Gait velocity: decr     General Gait Details: amb 30 ft, CGA, RW, no LOB  Stairs            Wheelchair Mobility    Modified Rankin (Stroke Patients Only)       Balance Overall balance assessment: Needs assistance Sitting-balance support: Feet unsupported Sitting balance-Leahy Scale: Fair     Standing balance support: Bilateral upper extremity supported, During functional activity, Reliant on assistive device for balance Standing balance-Leahy Scale: Fair                               Pertinent Vitals/Pain Pain Assessment Pain Assessment: 0-10 Pain Score: 7  Pain Descriptors / Indicators: Burning Pain Intervention(s): Limited activity within patient's tolerance, Monitored during session    Home Living Family/patient expects to be discharged to:: Private residence Living Arrangements: Children Available Help at Discharge: Family;Available 24 hours/day Type of Home: House Home Access: Stairs to enter Entrance Stairs-Rails: None Entrance Stairs-Number of Steps: 2 STE   Home Layout: One level Home Equipment: Conservation officer, nature (2 wheels);Cane - single point Additional Comments: has AD but has not used them    Prior Function Prior Level of Function : Independent/Modified Independent             Mobility Comments: (  I) w mobility ADLs Comments: (I) w ADLs/IADLS     Hand Dominance        Extremity/Trunk Assessment   Upper Extremity Assessment Upper Extremity Assessment: Overall WFL for tasks assessed    Lower Extremity Assessment Lower Extremity  Assessment: Generalized weakness    Cervical / Trunk Assessment Cervical / Trunk Assessment: Normal  Communication   Communication: No difficulties  Cognition Arousal/Alertness: Awake/alert Behavior During Therapy: WFL for tasks assessed/performed Overall Cognitive Status: Within Functional Limits for tasks assessed                                          General Comments      Exercises Total Joint Exercises Ankle Circles/Pumps: AROM, Both, 15 reps Quad Sets: Strengthening, 15 reps Gluteal Sets: Strengthening, 15 reps HEP for above exercises, every 1-2 hours daily   Assessment/Plan    PT Assessment Patient needs continued PT services  PT Problem List Decreased strength;Decreased activity tolerance;Decreased balance;Decreased mobility       PT Treatment Interventions DME instruction;Therapeutic exercise;Gait training;Balance training;Neuromuscular re-education;Functional mobility training;Therapeutic activities;Patient/family education    PT Goals (Current goals can be found in the Care Plan section)  Acute Rehab PT Goals Patient Stated Goal: to get back to cooking PT Goal Formulation: With patient Time For Goal Achievement: 02/26/22 Potential to Achieve Goals: Good    Frequency Min 2X/week     Co-evaluation               AM-PAC PT "6 Clicks" Mobility  Outcome Measure Help needed turning from your back to your side while in a flat bed without using bedrails?: None Help needed moving from lying on your back to sitting on the side of a flat bed without using bedrails?: A Little Help needed moving to and from a bed to a chair (including a wheelchair)?: A Little Help needed standing up from a chair using your arms (e.g., wheelchair or bedside chair)?: A Little Help needed to walk in hospital room?: A Little Help needed climbing 3-5 steps with a railing? : A Lot 6 Click Score: 18    End of Session Equipment Utilized During Treatment: Gait  belt Activity Tolerance: Patient tolerated treatment well Patient left: in chair;with call bell/phone within reach;with chair alarm set;with family/visitor present Nurse Communication: Mobility status PT Visit Diagnosis: Other abnormalities of gait and mobility (R26.89);Muscle weakness (generalized) (M62.81)    Time: 8250-5397 PT Time Calculation (min) (ACUTE ONLY): 27 min   Charges:              Glenice Laine MPH, SPT 02/13/22, 12:04 PM

## 2022-02-13 NOTE — Progress Notes (Signed)
Progress Note   Patient: [name] [name] WNU:[id number] DOB: 1941/03/07 DOA: 02/12/2022     1 DOS: the patient was seen and examined on 02/13/2022   Brief hospital course: HPI on admission: "DEVINE DANT is a 81 y.o. female with medical history significant of HTN, HLD, stroke (patient is not aware of this diagnosis),hypothyroidism, left breast cancer (s/p for lumpectomy and radiation therapy), skin cancer, diverticulosis, post herpetic neuralgia, gallstone, possible esophagitis, who presents with syncope and abdominal pain.   Patient states that she has right upper quadrant abdominal pain for several months.  She was diagnosed as gallstone.  Right upper quadrant ultrasound on 01/18/2022 showed tiny gallstones without evidence of cholecystitis.  Patient was seen by Dr. Dahlia Byes of surgery on 7/24, planning to do surgery, however patient was found to have hyponatremia, therefore surgery has not been scheduled yet.  Patient states that her abdominal pain has worsened the last night. It is located in the right lower quadrant and epigastric area, constant, aching and burning-like pain, 8 out of 10 in severity, radiating to the middle back.  Associated with nausea, but no vomiting, diarrhea or abdominal pain.  No fever or chills.  Patient does not have chest pain, cough, shortness breath.  Denies symptoms of UTI. Of note, pt is started on Diflucan on 8/3 for possible esophagitis by GI."  She had a brief syncopal episode in the ED, found to be bradycardic, but not hypotensive.  Metoprolol held.  HR subsequently stable  Labs notable for hyponatremia with Na 126.  UA was concerning for infection, pt started on empiric antibiotics pending cultures.  CT head negative.    Admitted to the hospital and started on IV fluids. Nephrology consulted after Na fell to 122 after initial fluids were given.  General surgery consulted given plan for cholecystectomy.     Assessment and Plan: * Hyponatremia Na 126 on  admission.  Hypotonic.  On exam pt appears mildly hypovolemic.  Suspect due to poor oral intake and dehydration.  Pt reports drinking about three 12 oz glasses of water daily, sometimes less.  Other differential diagnosis includes SIADH.  TSH normal, free T4 mildly elevated. After initial fluids given, Na repeat was 122. Nephrology was consulted and recommended fluid restriction and salt tablets, assuming SIADH.   IV fluids were not stopped, and Na improved to 133. --Stop IV fluids --Monitor Na level closely --Stop salt tablets for now  --Fluid restriction --Repeat BMP this afternoon    Syncope Etiology is not clear. The differential diagnosis is broad, including vasovagal syncope, TIA/stroke, cardiac arrhythmia, ACS (less likely, given no chest pain and normal trop), orthostatic status, etc).  MRI negative. Echo with normal EF, mild MR. -- Orthostatic vital signs  -- Neuro checks  --PT/OT --Telemetry  Cholelithiasis RUQ ultrasound on 01/18/2022 showed tiny gallstone, without evidence of cholecystitis.  On admission, no fever or leukocytosis.  Lipase normal 35, liver function normal. --General surgery following, plan for chole tomorrow --NPO at midnight --Supportive care - antiemetics, pain control PRN --Stop IV fluids today and monitor Na level   Abdominal pain Likely due to gallstone/cholecystitis.   CTA negative for dissection. --General surgery following --RUQ U/S this afternoon --Inpatient cholecystectomy planned for tomorrow  History of CVA (cerebrovascular accident) Stroke is listed in medical problem list, but the patient is not aware of having prior stroke.   Not taking aspirin or pravastatin currently. MRI brain no acute findings, mild small vessel ischemic diesase. --Hold off aspirin due to possible esophagitis  Hypertension Patient reports not taking Cozaar. BP's mildly elevated --IV hydralazine as needed for now  HLD (hyperlipidemia) Patient is not  taking pravastatin -Follow-up with PCP  UTI (urinary tract infection) Empiric IV Rocephin -Follow-up urine culture  Hypothyroidism, adult Patient is not taking medications currently TSH normal. Free T4 mildly elevated. PCP follow up.  Esophagitis - Continue Diflucan -Continue Protonix and Carafate        Subjective: Pt in recliner, daughter at bedside when seen on rounds.  She reports feeling better today.  Having some RUQ abdominal discomfort, not severe.  She and daughter report that she doesn't drink as much fluid as she probably should, about three 12 oz glasses water, less at times.  She denies N/V.  No other acute complaints.   Physical Exam: Vitals:   02/12/22 2118 02/13/22 0429 02/13/22 0713 02/13/22 1528  BP: (!) 148/70 136/77 (!) 157/88 (!) 151/76  Pulse: 78 83 (!) 101 82  Resp: '16 16 16 16  '$ Temp: 98.2 F (36.8 C) 98.2 F (36.8 C) 98 F (36.7 C) 97.9 F (36.6 C)  TempSrc:      SpO2: 99% 98% 100% 98%  Weight:      Height:       General exam: awake, alert, no acute distress HEENT: atraumatic, clear conjunctiva, anicteric sclera, moist mucus membranes, hearing grossly normal  Respiratory system: CTAB, no wheezes, rales or rhonchi, normal respiratory effort. Cardiovascular system: normal S1/S2, RRR, no JVD, murmurs, rubs, gallops, no pedal edema.   Gastrointestinal system: soft, mild RUQ tenderness no rebound or guarding. Central nervous system: A&O x4. no gross focal neurologic deficits, normal speech Extremities: moves all, no edema, normal tone Skin: dry, intact, normal temperature Psychiatry: normal mood, congruent affect, judgement and insight appear normal   Data Reviewed:  Notable labs -- Na 133, BUN 6, Ca 8.8,   Family Communication: daughter at bedside on rounds  Disposition: Status is: Inpatient Remains inpatient appropriate because: Ongoing evaluation not appropriate for outpatient setting.  Anticipate surgery.   Planned Discharge  Destination: Home    Time spent: 45 minutes  Author: Ezekiel Slocumb, DO 02/13/2022 3:54 PM  For on call review [email or web address].

## 2022-02-13 NOTE — Progress Notes (Signed)
OT Cancellation Note  Patient Details Name: [name] [name] MRN: [id number] DOB: [birth year]   Cancelled Treatment:    Reason Eval/Treat Not Completed: Other (comment) OT attempting to see pt for evaluation. DO present in room talking with pt and family. OT to f/u this afternoon or next available service date.   Doneta Public 02/13/2022, 10:51 AM

## 2022-02-13 NOTE — Progress Notes (Signed)
[facility]  ROUNDING NOTE   Subjective:   Patient seen sitting up in chair, daughter at bedside Tolerating meals without nausea and vomiting Remains on room air No lower extremity edema  Objective:  Vital signs in last 24 hours:  Temp:  [98 F (36.7 C)-98.6 F (37 C)] 98 F (36.7 C) (08/14 0713) Pulse Rate:  [78-101] 101 (08/14 0713) Resp:  [16] 16 (08/14 0713) BP: (136-157)/(70-88) 157/88 (08/14 0713) SpO2:  [97 %-100 %] 100 % (08/14 0713)  Weight change:  Filed Weights   02/12/22 0427  Weight: 49 kg    Intake/Output: I/O last 3 completed shifts: In: 1951.6 [P.O.:120; I.V.:1752.2; IV Piggyback:79.4] Out: 2625 [Urine:2625]   Intake/Output this shift:  No intake/output data recorded.  Physical Exam: General: NAD  Head: Normocephalic, atraumatic. Moist oral mucosal membranes  Eyes: Anicteric  Lungs:  Clear to auscultation, normal effort, room air  Heart: Regular rate and rhythm  Abdomen:  Soft, nontender, nondistended  Extremities: No peripheral edema.  Neurologic: Nonfocal, moving all four extremities  Skin: No lesions  Access: None    Basic Metabolic Panel: Recent Labs  Lab 02/12/22 0851 02/12/22 1453 02/12/22 2002 02/13/22 0152 02/13/22 0754  NA 122* 125* 129* 131* 133*  K 3.9 4.0 3.7 3.6 3.7  CL 91* 92* 99 102 102  CO2 24 21* '22 24 24  '$ GLUCOSE 110* 107* 101* 89 90  BUN 6* 6* 6* 6* 6*  CREATININE 0.49 0.49 0.63 0.51 0.56  CALCIUM 8.7* 8.7* 8.8* 8.6* 8.8*  MG  --  1.7  --   --   --     Liver Function Tests: Recent Labs  Lab 02/12/22 0432 02/12/22 1453  AST 26 20  ALT 19 16  ALKPHOS 57 50  BILITOT 0.8 0.6  PROT 7.0 6.4*  ALBUMIN 4.1 3.7   Recent Labs  Lab 02/12/22 0432  LIPASE 35   No results for input(s): "AMMONIA" in the last 168 hours.  CBC: Recent Labs  Lab 02/12/22 0432  WBC 10.2  HGB 12.6  HCT 37.0  MCV 86.7  PLT 407*    Cardiac Enzymes: No results for input(s): "CKTOTAL", "CKMB", "CKMBINDEX",  "TROPONINI" in the last 168 hours.  BNP: Invalid input(s): "POCBNP"  CBG: Recent Labs  Lab 02/12/22 0445  GLUCAP 111*    Microbiology: Results for orders placed or performed during the hospital encounter of 02/02/22  KOH prep     Status: None   Collection Time: 02/02/22 11:32 AM   Specimen: Esophagus  Result Value Ref Range Status   Specimen Description ESOPHAGUS  Final   Special Requests NONE  Final   KOH Prep   Final    YEAST WITH PSEUDOHYPHAE Performed at [facility], [address] [place], [place] [address]    Report Status 02/02/2022 FINAL  Final    Coagulation Studies: Recent Labs    02/12/22 0449  LABPROT 13.4  INR 1.0    Urinalysis: Recent Labs    02/12/22 0622  COLORURINE STRAW*  LABSPEC 1.023  PHURINE 8.0  GLUCOSEU 50*  HGBUR SMALL*  BILIRUBINUR NEGATIVE  KETONESUR 5*  PROTEINUR NEGATIVE  NITRITE NEGATIVE  LEUKOCYTESUR MODERATE*      Imaging: ECHOCARDIOGRAM COMPLETE  Result Date: 02/13/2022    ECHOCARDIOGRAM REPORT   Patient Name:   [name] [name] [facility] Date of Exam: 02/12/2022 Medical Rec #:  [id number]       Height:       60.0 in Accession #:    [id number]  Weight:       108.0 lb Date of Birth:  1941-04-13       BSA:          1.437 m Patient Age:    81 years        BP:           169/88 mmHg Patient Gender: F               HR:           81 bpm. Exam Location:  [facility] Procedure: 2D Echo Indications:     Syncope R55  History:         Patient has no prior history of Echocardiogram examinations.  Sonographer:     Kathlen Brunswick RDCS Referring Phys:  [place] Diagnosing Phys: Serafina Royals MD IMPRESSIONS  1. Left ventricular ejection fraction, by estimation, is 55 to 60%. The left ventricle has normal function. The left ventricle has no regional wall motion abnormalities. Left ventricular diastolic parameters were normal.  2. Right ventricular systolic function is normal. The right ventricular size is normal.  3. The mitral valve is normal  in structure. Mild mitral valve regurgitation.  4. The aortic valve is normal in structure. Aortic valve regurgitation is trivial. FINDINGS  Left Ventricle: Left ventricular ejection fraction, by estimation, is 55 to 60%. The left ventricle has normal function. The left ventricle has no regional wall motion abnormalities. The left ventricular internal cavity size was normal in size. There is  no left ventricular hypertrophy. Left ventricular diastolic parameters were normal. Right Ventricle: The right ventricular size is normal. No increase in right ventricular wall thickness. Right ventricular systolic function is normal. Left Atrium: Left atrial size was normal in size. Right Atrium: Right atrial size was normal in size. Pericardium: There is no evidence of pericardial effusion. Mitral Valve: The mitral valve is normal in structure. Mild mitral valve regurgitation. Tricuspid Valve: The tricuspid valve is normal in structure. Tricuspid valve regurgitation is mild. Aortic Valve: The aortic valve is normal in structure. Aortic valve regurgitation is trivial. Aortic valve peak gradient measures 3.9 mmHg. Pulmonic Valve: The pulmonic valve was normal in structure. Pulmonic valve regurgitation is trivial. Aorta: The aortic root and ascending aorta are structurally normal, with no evidence of dilitation. IAS/Shunts: No atrial level shunt detected by color flow Doppler.  LEFT VENTRICLE PLAX 2D LVIDd:         3.25 cm     Diastology LVIDs:         2.32 cm     LV e' medial:    4.90 cm/s LV PW:         1.12 cm     LV E/e' medial:  14.2 LV IVS:        1.03 cm     LV e' lateral:   7.29 cm/s LVOT diam:     1.80 cm     LV E/e' lateral: 9.5 LV SV:         37 LV SV Index:   26 LVOT Area:     2.54 cm  LV Volumes (MOD) LV vol d, MOD A2C: 55.2 ml LV vol d, MOD A4C: 60.8 ml LV vol s, MOD A2C: 15.7 ml LV vol s, MOD A4C: 25.1 ml LV SV MOD A2C:     39.5 ml LV SV MOD A4C:     60.8 ml LV SV MOD BP:      36.9 ml RIGHT VENTRICLE RV Basal  diam:  2.13 cm RV S prime:     14.80 cm/s TAPSE (M-mode): 1.7 cm LEFT ATRIUM           Index        RIGHT ATRIUM          Index LA diam:      2.40 cm 1.67 cm/m   RA Area:     7.05 cm LA Vol (A2C): 5.5 ml  3.84 ml/m   RA Volume:   12.70 ml 8.84 ml/m LA Vol (A4C): 18.7 ml 13.02 ml/m  AORTIC VALVE                 PULMONIC VALVE AV Area (Vmax): 1.92 cm     PV Vmax:       0.72 m/s AV Vmax:        98.60 cm/s   PV Peak grad:  2.1 mmHg AV Peak Grad:   3.9 mmHg LVOT Vmax:      74.40 cm/s LVOT Vmean:     47.100 cm/s LVOT VTI:       0.146 m  AORTA Ao Root diam: 3.00 cm MITRAL VALVE                TRICUSPID VALVE MV Area (PHT): 4.99 cm     TV Peak grad:   22.8 mmHg MV Decel Time: 152 msec     TV Vmax:        2.39 m/s MV E velocity: 69.40 cm/s MV A velocity: 104.00 cm/s  SHUNTS MV E/A ratio:  0.67         Systemic VTI:  0.15 m                             Systemic Diam: 1.80 cm Serafina Royals MD Electronically signed by Serafina Royals MD Signature Date/Time: 02/13/2022/8:44:39 AM    Final    MR BRAIN WO CONTRAST  Result Date: 02/12/2022 CLINICAL DATA:  81 year old female with nausea, vomiting, abdominal pain, altered mental status. EXAM: MRI HEAD WITHOUT CONTRAST TECHNIQUE: Multiplanar, multiecho pulse sequences of the brain and surrounding structures were obtained without intravenous contrast. COMPARISON:  Head CT earlier today. FINDINGS: Brain: No restricted diffusion to suggest acute infarction. No midline shift, mass effect, evidence of mass lesion, ventriculomegaly, extra-axial collection or acute intracranial hemorrhage. Cervicomedullary junction and pituitary are within normal limits. Patchy bilateral scattered periventricular and subcortical white matter T2 and FLAIR hyperintensity is fairly mild for age. No cortical encephalomalacia or chronic cerebral blood products. Deep gray matter nuclei, brainstem and cerebellum are within normal limits for age. Vascular: Major intracranial vascular flow voids are  preserved, the distal right vertebral artery appears dominant. Skull and upper cervical spine: Degenerative ligamentous hypertrophy about the odontoid. Otherwise negative for age visible cervical spine. Visualized bone marrow signal is within normal limits. Sinuses/Orbits: Negative.  Postoperative changes to both globes. Other: Visible internal auditory structures appear normal. Mastoids are clear. Negative visible scalp and face. IMPRESSION: 1. No acute intracranial abnormality. 2. Mild for age nonspecific cerebral white matter signal changes, most commonly due to chronic small vessel disease. Electronically Signed   By: Genevie Ann M.D.   On: 02/12/2022 11:01   CT Angio Chest/Abd/Pel for Dissection W and/or W/WO  Result Date: 02/12/2022 CLINICAL DATA:  81 year old female with right upper quadrant pain radiating between the shoulder blades. Known cholelithiasis. Nausea and vomiting. EXAM: CT ANGIOGRAPHY CHEST, ABDOMEN AND PELVIS TECHNIQUE: Non-contrast CT of the chest was  initially obtained. Multidetector CT imaging through the chest, abdomen and pelvis was performed using the standard protocol during bolus administration of intravenous contrast. Multiplanar reconstructed images and MIPs were obtained and reviewed to evaluate the vascular anatomy. RADIATION DOSE REDUCTION: This exam was performed according to the departmental dose-optimization program which includes automated exposure control, adjustment of the mA and/or kV according to patient size and/or use of iterative reconstruction technique. CONTRAST:  182m OMNIPAQUE IOHEXOL 350 MG/ML SOLN COMPARISON:  CT Abdomen and Pelvis 01/13/2022. Noncontrast chest CT 09/17/2014. FINDINGS: CTA CHEST FINDINGS Cardiovascular: Chronic calcified coronary artery and aortic atherosclerosis. No cardiomegaly or pericardial effusion. Following contrast there is no thoracic aortic dissection or aneurysm. Chronic mild tortuosity of the aortic arch. Bilateral pulmonary arteries  also are enhancing and appear to be patent. Mediastinum/Nodes: Negative. No mediastinal mass or lymphadenopathy. Lungs/Pleura: Fairly stable lung volumes since 2016. Major airways are patent. Both lungs are stable and essentially clear aside from mild chronic lung base scarring or atelectasis. No pleural effusion. Musculoskeletal: 2016 mid sternal fracture appears healed. Stable thoracic vertebral height and alignment. Visible shoulder osseous structures appear intact. No acute osseous abnormality identified. Chronic dystrophic calcifications of the left breast (series 7, image 106) were partially visible in 2016 and appear stable. Review of the MIP images confirms the above findings. CTA ABDOMEN AND PELVIS FINDINGS VASCULAR Mild for age Aortoiliac calcified atherosclerosis. Negative for abdominal aortic aneurysm or dissection. Major arterial branches in the abdomen and pelvis are patent. Review of the MIP images confirms the above findings. NON-VASCULAR Hepatobiliary: Negative liver. Cholelithiasis not evident by CT. Gallbladder is mildly distended but with no convincing pericholecystic inflammation. 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 Spleen: Negative. Adrenals/Urinary Tract: Negative.  Pelvic phleboliths. Stomach/Bowel: Redundant sigmoid colon distended with combined gas and stool at the pelvic inlet and in the pelvis. Extensive diverticulosis of the proximal sigmoid. And extensive diverticulosis of the descending colon. No active inflammation is evident in those segments. Redundant splenic flexure is decompressed. Transverse and right colon appear stable and negative. Appendix not clearly delineated but no pericecal inflammation. No dilated small bowel. Decompressed stomach and duodenum. No free air or free fluid identified. No mesenteric inflammation identified.  Lymphatic: No lymphadenopathy identified. Reproductive: Absent uterus.  Diminutive or absent ovaries. Other: No pelvic free fluid. Musculoskeletal: Stable visualized osseous structures. Advanced lumbar spine disc and posterior element degeneration. Review of the MIP images confirms the above findings. IMPRESSION: 1. Aortic Atherosclerosis (ICD10-I70.0), although mild for age. Negative for abdominal aortic aneurysm or dissection. No pulmonary artery embolism identified. 2. No acute or inflammatory process identified in the chest, abdomen, or pelvis. Cholelithiasis not evident by CT. Extensive large bowel diverticulosis. Electronically Signed   By: HGenevie AnnM.D.   On: 02/12/2022 05:42   CT Head Wo Contrast  Result Date: 02/12/2022 CLINICAL DATA:  81year old female with right upper quadrant pain radiating between the shoulder blades. Known cholelithiasis. Nausea and vomiting. altered mental status. EXAM: CT HEAD WITHOUT CONTRAST TECHNIQUE: Contiguous axial images were obtained from the base of the skull through the vertex without intravenous contrast. RADIATION DOSE REDUCTION: This exam was performed according to the departmental dose-optimization program which includes automated exposure control, adjustment of the mA and/or kV according to patient size and/or use of iterative reconstruction technique. COMPARISON:  Head CT 01/13/2022. FINDINGS: Brain: Stable cerebral volume. No midline shift, ventriculomegaly, mass effect, evidence  of mass lesion, intracranial hemorrhage or evidence of cortically based acute infarction. Minimal to mild for age cerebral white matter hypodensity is stable, more apparent in the left hemisphere. Otherwise normal gray-white differentiation. Vascular: Calcified atherosclerosis at the skull base. No suspicious intracranial vascular hyperdensity. Skull: No acute osseous abnormality identified. Sinuses/Orbits: Visualized paranasal sinuses and mastoids are stable and well aerated. Other: No  acute orbit or scalp soft tissue finding. IMPRESSION: No acute intracranial abnormality. Mild for age cerebral white matter changes. Electronically Signed   By: Genevie Ann M.D.   On: 02/12/2022 05:30     Medications:    cefTRIAXone (ROCEPHIN)  IV 1 g (02/13/22 0915)    enoxaparin (LOVENOX) injection  40 mg Subcutaneous Q24H   fluconazole  100 mg Oral Daily   [START ON 02/14/2022] indocyanine green  2.5 mg Intravenous On Call to OR   pantoprazole  40 mg Oral Daily   sucralfate  1 g Oral QID   acetaminophen, alum & mag hydroxide-simeth, hydrALAZINE, morphine injection, ondansetron (ZOFRAN) IV, oxyCODONE-acetaminophen, senna  Assessment/ Plan:  Ms. TAYLOUR LIETZKE is a 81 y.o.  female with a PMHx of hypertension, coronary artery disease, hyperlipidemia, history of CVA, history of breast cancer s/p radiation and hysterectomy, diverticulosis and postherpetic neuralgia who is now admitted with history of abdominal pain and a syncopal episode.  Patient has history of cholelithiasis and was supposed to go for cholecystectomy.  Surgery was postponed secondary to her hyponatremia.  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  Sodium on admission 126 with lowest point of 122.  Patient successfully treated with sodium tablets and IV fluid.  Current sodium 133 today.  We will continue patient on sodium tablets.  Patient instructed to maintain fluid restriction of 32 ounces.    LOS: 1 Elnor Renovato 8/14/20232:32 PM

## 2022-02-13 NOTE — Consult Note (Signed)
[facility] SURGICAL CONSULTATION NOTE (initial) - cpt: 33825   HISTORY OF PRESENT ILLNESS (HPI):  81 y.o. female presented to [facility] ED yesterday for evaluation of abdominal pain. Patient reportedly initially presenting for RUQ abdominal pain which radiated through to her back. Unfortunately, she had a witnessed syncopal episode in triage. She is known to our service and has been followed as an outpatient by Dr Dahlia Byes for possible cholecystectomy which was been delayed as she continues to have recurrent issues with hyponatremia. She has had extensive work up for this as well including MRI Brain. Work up in the ED revealed persistent hyponatremia to 126 which got as low as 122 (most recent 133), chloride 92, renal function normal with sCr - 0.58, WBC normal at 10.2K, serum osmolarity 257 (slightly low), urine osmolarity normal at 324, urine sodium normal at 115. She did not have further imaging. She was admitted to the medicine service. Nephrology on board  Surgery is consulted by hospitalist physician Dr. Ivor Costa, MD in this context for evaluation and management of cholelithiasis.  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   Trigger finger      PAST SURGICAL HISTORY ([place]):  Past Surgical History:  Procedure Laterality Date   ABDOMINAL  HYSTERECTOMY     APPENDECTOMY  1954   BREAST BIOPSY Left 1986   neg   BREAST BIOPSY Left 1988   neg   BREAST BIOPSY Left 1995   neg   BREAST BIOPSY Left 2009   positive (ultrasound guided biopsy)   BREAST LUMPECTOMY     BROW LIFT  02/18/2021   Procedure: BLEPHAROPTOSIS REPAIR; RESECT EX BILATERAL;  Surgeon: Karle Starch, MD;  Location: [facility];  Service: Ophthalmology;;   COLONOSCOPY  07/31/2013   COLONOSCOPY WITH PROPOFOL N/A 01/10/2016   Procedure: COLONOSCOPY WITH PROPOFOL;  Surgeon: Manya Silvas, MD;  Location: [facility] ENDOSCOPY;  Service: Endoscopy;  Laterality: N/A;   ESOPHAGOGASTRODUODENOSCOPY  1/292015   ESOPHAGOGASTRODUODENOSCOPY N/A 01/09/2016   Procedure: ESOPHAGOGASTRODUODENOSCOPY (EGD);  Surgeon: Manya Silvas, MD;  Location: [facility] ENDOSCOPY;  Service: Endoscopy;  Laterality: N/A;   ESOPHAGOGASTRODUODENOSCOPY (EGD) WITH PROPOFOL N/A 02/02/2022   Procedure: ESOPHAGOGASTRODUODENOSCOPY (EGD) WITH PROPOFOL;  Surgeon: Annamaria Helling, DO;  Location: [place];  Service: Gastroenterology;  Laterality: N/A;   eye lid lift     [place]   WRIST SURGERY  2009, 2006   ganglion cysts removal     MEDICATIONS:  Prior to Admission medications   Medication Sig Start Date End Date Taking? Authorizing Provider  Alum Hydroxide-Mag Carbonate 160-105 MG CHEW Chew 2 tablets by mouth daily as needed. 01/25/22 04/25/22 Yes [provider]  diclofenac Sodium (VOLTAREN) 1 % GEL Apply 2 g topically 4 (  four) times daily. 01/25/22  Yes [provider]  fluconazole (DIFLUCAN) 100 MG tablet Take 100 mg by mouth daily. 02/02/22  Yes [provider]  pantoprazole (PROTONIX) 40 MG tablet Take by mouth. 01/25/22 02/24/22 Yes [provider]  senna (SENOKOT) 8.6 MG tablet Take 1 tablet by mouth daily.   Yes [provider]  simethicone (MYLICON) 80 MG chewable tablet Chew 160 mg by mouth 4 (four) times  daily as needed. 01/25/22 04/25/22 Yes [provider]  sucralfate (CARAFATE) 1 g tablet Take 1 tablet (1 g total) by mouth 4 (four) times daily. 01/18/22 01/18/23 Yes Ward, Delice Bison, DO  aspirin EC 81 MG tablet Take 81 mg by mouth daily. Patient not taking: Reported on 02/12/2022    [provider]  losartan (COZAAR) 25 MG tablet Take 25 mg by mouth daily. Patient not taking: Reported on 02/12/2022    [provider]  Multiple Vitamin (MULTI-VITAMIN) tablet Take 1 tablet by mouth daily. Patient not taking: Reported on 02/12/2022    [provider]  ondansetron (ZOFRAN-ODT) 4 MG disintegrating tablet Take 1 tablet (4 mg total) by mouth every 6 (six) hours as needed for nausea or vomiting. 01/18/22   Ward, Delice Bison, DO  polyethylene glycol powder (GLYCOLAX/MIRALAX) 17 GM/SCOOP powder Take by mouth.    [provider]  pravastatin (PRAVACHOL) 40 MG tablet Take 40 mg by mouth at bedtime.  Patient not taking: Reported on 02/12/2022 06/09/15   [provider]  promethazine (PHENERGAN) 25 MG suppository SMARTSIG:1 SUPPOS Rectally Every Night PRN 01/28/22   [provider]     ALLERGIES:  Allergies  Allergen Reactions   Levofloxacin Other (See Comments)    Causes muscle problems.   Demerol [Meperidine] Nausea Only   Other Nausea And Vomiting   Sulfa Antibiotics Nausea Only     SOCIAL HISTORY:  Social History   Socioeconomic History   Marital status: Widowed    Spouse name: Not on file   Number of children: Not on file   Years of education: Not on file   Highest education level: Not on file  Occupational History   Not on file  Tobacco Use   Smoking status: Never   Smokeless tobacco: Never  Vaping Use   Vaping Use: Never used  Substance and Sexual Activity   Alcohol use: No    Alcohol/week: 0.0 standard drinks of alcohol   Drug use: No   Sexual activity: Not on file  Other Topics Concern   Not on file  Social History  Narrative   Not on file   Social Determinants of Health   Financial Resource Strain: Not on file  Food Insecurity: Not on file  Transportation Needs: Not on file  Physical Activity: Not on file  Stress: Not on file  Social Connections: Not on file  Intimate Partner Violence: Not on file     FAMILY HISTORY:  Family History  Problem Relation Age of Onset   Prostate cancer Brother    Crohn's disease Brother    Heart attack Father    Prostate cancer Father    Vaginal cancer Maternal Grandmother    Stroke Mother    Breast cancer Neg Hx       REVIEW OF SYSTEMS:  Review of Systems  Constitutional:  Negative for chills and fever.  HENT:  Negative for congestion and sore throat.   Respiratory:  Negative for cough and shortness of breath.   Cardiovascular:  Negative for chest pain  and palpitations.  Gastrointestinal:  Positive for abdominal pain. Negative for nausea and vomiting.  Genitourinary:  Negative for dysuria and urgency.  Neurological:  Positive for loss of consciousness. Negative for dizziness, tingling, sensory change, focal weakness, weakness and headaches.  All other systems reviewed and are negative.   VITAL SIGNS:  Temp:  [98 F (36.7 C)-98.6 F (37 C)] 98 F (36.7 C) (08/14 0713) Pulse Rate:  [77-101] 101 (08/14 0713) Resp:  [15-16] 16 (08/14 0713) BP: (127-161)/(70-88) 157/88 (08/14 0713) SpO2:  [97 %-100 %] 100 % (08/14 0713)     Height: 5' (152.4 cm) Weight: 49 kg BMI (Calculated): 21.09   INTAKE/OUTPUT:  08/13 0701 - 08/14 0700 In: 1951.6 [P.O.:120; I.V.:1752.2; IV Piggyback:79.4] Out: 2625 [Urine:2625]  PHYSICAL EXAM:  Physical Exam Vitals and nursing note reviewed. Exam conducted with a chaperone present.  Constitutional:      General: She is not in acute distress.    Appearance: She is well-developed and normal weight. She is not ill-appearing.     Comments: Patient resting in chair, AND, daughter present at bedside   HENT:     Head:  Normocephalic and atraumatic.  Cardiovascular:     Rate and Rhythm: Normal rate.     Heart sounds: Normal heart sounds.  Pulmonary:     Effort: Pulmonary effort is normal. No respiratory distress.  Abdominal:     General: There is no distension.     Palpations: Abdomen is soft.     Tenderness: There is abdominal tenderness (Mild) in the right upper quadrant. There is no guarding or rebound. Negative signs include Murphy's sign.     Comments: Abdomen is soft, she appears sore to very deep palpation in RUQ, Murphy's Sign is negative, non-distended, no rebound/guarding. She is certainly without peritonitis   Genitourinary:    Comments: Deferred Skin:    General: Skin is warm and dry.     Coloration: Skin is not jaundiced or pale.  Neurological:     General: No focal deficit present.     Mental Status: She is alert and oriented to person, place, and time.  Psychiatric:        Mood and Affect: Mood normal.        Behavior: Behavior normal.      Labs:     Latest Ref Rng & Units 02/12/2022    4:32 AM 01/18/2022    3:10 AM 01/13/2022   10:59 AM  CBC  WBC 4.0 - 10.5 K/uL 10.2  7.3  6.0   Hemoglobin 12.0 - 15.0 g/dL 12.6  12.2  12.7   Hematocrit 36.0 - 46.0 % 37.0  35.3  37.6   Platelets 150 - 400 K/uL 407  339  365       Latest Ref Rng & Units 02/13/2022    7:54 AM 02/13/2022    1:52 AM 02/12/2022    8:02 PM  CMP  Glucose 70 - 99 mg/dL 90  89  101   BUN 8 - 23 mg/dL '6  6  6   '$ Creatinine 0.44 - 1.00 mg/dL 0.56  0.51  0.63   Sodium 135 - 145 mmol/L 133  131  129   Potassium 3.5 - 5.1 mmol/L 3.7  3.6  3.7   Chloride 98 - 111 mmol/L 102  102  99   CO2 22 - 32 mmol/L '24  24  22   '$ Calcium 8.9 - 10.3 mg/dL 8.8  8.6  8.8      Imaging  studies:  No new pertinent imaging studies   Assessment/Plan: (ICD-10's: K80.20) 81 y.o. female with RUQ abdominal pain and cholelithiasis, no definitive evidence of cholecystitis, complicated by pertinent comorbidities including hyponatremia.   -  we will plan on repeating RUQ [place] to ensure no acute changes to her gallbladder. Unfortunately, in the setting of her persistent hyponatremia, she is a sub-optimal surgical candidate. Will discuss with nephrology to determine potential options. If there is concern for acute cholecystitis on RUQ [place], we may need to pursue temporizing percutaneous cholecystostomy to allow for optimization. We will follow     - Okay for diet for now - No role for Abx currently from GB perspective; she is on Rocephin for UTI.  - Monitor hyponatremia; appreciate nephrology assistance   - Monitor abdominal examination - Pain control prn; antiemetics prn   - Mobilize as tolerated  - Further management per primary service; we will follow   All of the above findings and recommendations were discussed with the patient and her family  (daughter at bedside), and all of patient's and her family's questions were answered to their expressed satisfaction.  Thank you for the opportunity to participate in this patient's care.   -- Edison Simon, PA-C [facility] 02/13/2022, 10:45 AM M-F: 7am - 4pm

## 2022-02-13 NOTE — Plan of Care (Signed)

## 2022-02-13 NOTE — Hospital Course (Addendum)
HPI on admission: "[name] [name] is a 81 y.o. female with medical history significant of HTN, HLD, stroke (patient is not aware of this diagnosis),hypothyroidism, left breast cancer (s/p for lumpectomy and radiation therapy), skin cancer, diverticulosis, post herpetic neuralgia, gallstone, possible esophagitis, who presents with syncope and abdominal pain.   Patient states that she has right upper quadrant abdominal pain for several months.  She was diagnosed as gallstone.  Right upper quadrant ultrasound on 01/18/2022 showed tiny gallstones without evidence of cholecystitis.  Patient was seen by Dr. Dahlia Byes of surgery on 7/24, planning to do surgery, however patient was found to have hyponatremia, therefore surgery has not been scheduled yet.  Patient states that her abdominal pain has worsened the last night. It is located in the right lower quadrant and epigastric area, constant, aching and burning-like pain, 8 out of 10 in severity, radiating to the middle back.  Associated with nausea, but no vomiting, diarrhea or abdominal pain.  No fever or chills.  Patient does not have chest pain, cough, shortness breath.  Denies symptoms of UTI. Of note, pt is started on Diflucan on 8/3 for possible esophagitis by GI."  She had a brief syncopal episode in the ED, found to be bradycardic, but not hypotensive.  Metoprolol held.  HR subsequently stable  Labs notable for hyponatremia with Na 126.  UA was concerning for infection, pt started on empiric antibiotics pending cultures.  CT head negative.    Admitted to the hospital and started on IV fluids. Nephrology consulted after Na fell to 122 after initial fluids were given.  General surgery consulted given plan for cholecystectomy.  The patient underwent cholecystectomy on 02/14/2022. She was taken back to the OR early morning 02/17/2022 due to port hernia with incarcerated small bowel.

## 2022-02-14 ENCOUNTER — Encounter: Payer: Self-pay | Admitting: Internal Medicine

## 2022-02-14 ENCOUNTER — Encounter
Admission: EM | Disposition: A | Discharge status: Home Health | Payer: Self-pay | Source: Home / Self Care | Attending: Internal Medicine

## 2022-02-14 ENCOUNTER — Other Ambulatory Visit: Payer: Self-pay

## 2022-02-14 ENCOUNTER — Inpatient Hospital Stay: Payer: No Typology Code available for payment source | Admitting: Anesthesiology

## 2022-02-14 DIAGNOSIS — K8019 Calculus of gallbladder with other cholecystitis with obstruction: Secondary | ICD-10-CM | POA: Diagnosis not present

## 2022-02-14 DIAGNOSIS — K819 Cholecystitis, unspecified: Secondary | ICD-10-CM | POA: Diagnosis not present

## 2022-02-14 DIAGNOSIS — K8010 Calculus of gallbladder with chronic cholecystitis without obstruction: Secondary | ICD-10-CM | POA: Diagnosis not present

## 2022-02-14 DIAGNOSIS — E876 Hypokalemia: Secondary | ICD-10-CM

## 2022-02-14 DIAGNOSIS — R55 Syncope and collapse: Secondary | ICD-10-CM | POA: Diagnosis not present

## 2022-02-14 DIAGNOSIS — E039 Hypothyroidism, unspecified: Secondary | ICD-10-CM | POA: Diagnosis not present

## 2022-02-14 DIAGNOSIS — I10 Essential (primary) hypertension: Secondary | ICD-10-CM | POA: Diagnosis not present

## 2022-02-14 DIAGNOSIS — E785 Hyperlipidemia, unspecified: Secondary | ICD-10-CM | POA: Diagnosis not present

## 2022-02-14 DIAGNOSIS — E871 Hypo-osmolality and hyponatremia: Secondary | ICD-10-CM | POA: Diagnosis not present

## 2022-02-14 LAB — BASIC METABOLIC PANEL
Anion gap: 7 (ref 5–15)
Anion gap: 11 (ref 5–15)
BUN: 6 mg/dL — ABNORMAL LOW (ref 8–23)
BUN: 7 mg/dL — ABNORMAL LOW (ref 8–23)
CO2: 25 mmol/L (ref 22–32)
CO2: 22 mmol/L (ref 22–32)
Calcium: 8.9 mg/dL (ref 8.9–10.3)
Calcium: 8.8 mg/dL — ABNORMAL LOW (ref 8.9–10.3)
Chloride: 101 mmol/L (ref 98–111)
Chloride: 96 mmol/L — ABNORMAL LOW (ref 98–111)
Creatinine, Ser: 0.56 mg/dL (ref 0.44–1.00)
Creatinine, Ser: 0.65 mg/dL (ref 0.44–1.00)
GFR, Estimated: >60 mL/min (ref >60)
GFR, Estimated: >60 mL/min (ref >60)
Glucose, Bld: 109 mg/dL — ABNORMAL HIGH (ref 70–99)
Glucose, Bld: 230 mg/dL — ABNORMAL HIGH (ref 70–99)
Potassium: 3.4 mmol/L — ABNORMAL LOW (ref 3.5–5.1)
Potassium: 3.9 mmol/L (ref 3.5–5.1)
Sodium: 133 mmol/L — ABNORMAL LOW (ref 135–145)
Sodium: 129 mmol/L — ABNORMAL LOW (ref 135–145)

## 2022-02-14 SURGERY — CHOLECYSTECTOMY, ROBOT-ASSISTED, LAPAROSCOPIC
Anesthesia: General

## 2022-02-14 MED ORDER — PROPOFOL 500 MG/50ML IV EMUL
INTRAVENOUS | Status: DC | PRN
  Administered 2022-02-14: 125 ug/kg/min via INTRAVENOUS

## 2022-02-14 MED ORDER — LIDOCAINE HCL (CARDIAC) PF 100 MG/5ML IV SOSY
PREFILLED_SYRINGE | INTRAVENOUS | Status: DC | PRN
  Administered 2022-02-14: 80 mg via INTRAVENOUS

## 2022-02-14 MED ORDER — DROPERIDOL 2.5 MG/ML IJ SOLN
INTRAMUSCULAR | Status: AC
  Administered 2022-02-14: 0.625 mg via INTRAVENOUS
  Filled 2022-02-14: qty 2

## 2022-02-14 MED ORDER — ESMOLOL HCL 100 MG/10ML IV SOLN
INTRAVENOUS | Status: AC
  Filled 2022-02-14: qty 10

## 2022-02-14 MED ORDER — BUPIVACAINE-EPINEPHRINE (PF) 0.25% -1:200000 IJ SOLN
INTRAMUSCULAR | Status: AC
  Filled 2022-02-14: qty 30

## 2022-02-14 MED ORDER — ACETAMINOPHEN 10 MG/ML IV SOLN
INTRAVENOUS | Status: AC
  Filled 2022-02-14: qty 100

## 2022-02-14 MED ORDER — LACTATED RINGERS IV SOLN: INTRAVENOUS | Status: DC | PRN

## 2022-02-14 MED ORDER — MELATONIN 5 MG PO TABS
5.0000 mg | ORAL_TABLET | Freq: Once | ORAL | Status: AC
  Administered 2022-02-15: 5 mg via ORAL
  Filled 2022-02-14: qty 1

## 2022-02-14 MED ORDER — PHENYLEPHRINE HCL-NACL 20-0.9 MG/250ML-% IV SOLN
INTRAVENOUS | Status: AC
  Filled 2022-02-14: qty 250

## 2022-02-14 MED ORDER — DEXAMETHASONE SODIUM PHOSPHATE 10 MG/ML IJ SOLN
INTRAMUSCULAR | Status: DC | PRN
  Administered 2022-02-14: 10 mg via INTRAVENOUS

## 2022-02-14 MED ORDER — DROPERIDOL 2.5 MG/ML IJ SOLN: 0.6250 mg | Freq: Once | INTRAMUSCULAR | Status: AC

## 2022-02-14 MED ORDER — PROPOFOL 1000 MG/100ML IV EMUL
INTRAVENOUS | Status: AC
Start: 2022-02-14 — End: ?
  Filled 2022-02-14: qty 100

## 2022-02-14 MED ORDER — POTASSIUM CHLORIDE CRYS ER 20 MEQ PO TBCR
40.0000 meq | EXTENDED_RELEASE_TABLET | Freq: Once | ORAL | Status: AC
  Administered 2022-02-14: 40 meq via ORAL
  Filled 2022-02-14: qty 2

## 2022-02-14 MED ORDER — BUPIVACAINE-EPINEPHRINE 0.25% -1:200000 IJ SOLN
INTRAMUSCULAR | Status: DC | PRN
  Administered 2022-02-14: 50 mL

## 2022-02-14 MED ORDER — METOPROLOL TARTRATE 5 MG/5ML IV SOLN
INTRAVENOUS | Status: AC
  Administered 2022-02-14: 2.5 mg via INTRAVENOUS
  Filled 2022-02-14: qty 5

## 2022-02-14 MED ORDER — FENTANYL CITRATE (PF) 100 MCG/2ML IJ SOLN
25.0000 ug | INTRAMUSCULAR | Status: DC | PRN
  Administered 2022-02-14 (×2): 25 ug via INTRAVENOUS

## 2022-02-14 MED ORDER — OXYCODONE HCL 5 MG/5ML PO SOLN: 5.0000 mg | Freq: Once | ORAL | Status: DC | PRN

## 2022-02-14 MED ORDER — ACETAMINOPHEN 500 MG PO TABS
1000.0000 mg | ORAL_TABLET | Freq: Four times a day (QID) | ORAL | Status: DC
  Administered 2022-02-14 – 2022-02-20 (×19): 1000 mg via ORAL
  Filled 2022-02-14 (×22): qty 2

## 2022-02-14 MED ORDER — PROPOFOL 10 MG/ML IV BOLUS
INTRAVENOUS | Status: DC | PRN
  Administered 2022-02-14: 30 mg via INTRAVENOUS
  Administered 2022-02-14: 100 mg via INTRAVENOUS

## 2022-02-14 MED ORDER — FENTANYL CITRATE (PF) 100 MCG/2ML IJ SOLN
INTRAMUSCULAR | Status: AC
  Filled 2022-02-14: qty 2

## 2022-02-14 MED ORDER — BUPIVACAINE LIPOSOME 1.3 % IJ SUSP
INTRAMUSCULAR | Status: AC
  Filled 2022-02-14: qty 20

## 2022-02-14 MED ORDER — SUGAMMADEX SODIUM 200 MG/2ML IV SOLN
INTRAVENOUS | Status: DC | PRN
  Administered 2022-02-14: 200 mg via INTRAVENOUS

## 2022-02-14 MED ORDER — PHENYLEPHRINE HCL-NACL 20-0.9 MG/250ML-% IV SOLN
INTRAVENOUS | Status: DC | PRN
  Administered 2022-02-14: 20 ug/min via INTRAVENOUS

## 2022-02-14 MED ORDER — OXYCODONE HCL 5 MG PO TABS
5.0000 mg | ORAL_TABLET | ORAL | Status: DC | PRN
  Administered 2022-02-14 – 2022-02-20 (×9): 5 mg via ORAL
  Filled 2022-02-14 (×11): qty 1

## 2022-02-14 MED ORDER — APREPITANT 40 MG PO CAPS
40.0000 mg | ORAL_CAPSULE | Freq: Once | ORAL | Status: AC
  Administered 2022-02-14: 40 mg via ORAL

## 2022-02-14 MED ORDER — OXYCODONE HCL 5 MG PO TABS: 5.0000 mg | ORAL_TABLET | Freq: Once | ORAL | Status: DC | PRN

## 2022-02-14 MED ORDER — METOPROLOL TARTRATE 5 MG/5ML IV SOLN: 2.5000 mg | Freq: Once | INTRAVENOUS | Status: AC

## 2022-02-14 MED ORDER — ACETAMINOPHEN 10 MG/ML IV SOLN
INTRAVENOUS | Status: DC | PRN
  Administered 2022-02-14: 1000 mg via INTRAVENOUS

## 2022-02-14 MED ORDER — GLYCOPYRROLATE 0.2 MG/ML IJ SOLN
INTRAMUSCULAR | Status: DC | PRN
  Administered 2022-02-14: .2 mg via INTRAVENOUS

## 2022-02-14 MED ORDER — ROCURONIUM BROMIDE 100 MG/10ML IV SOLN
INTRAVENOUS | Status: DC | PRN
  Administered 2022-02-14: 50 mg via INTRAVENOUS

## 2022-02-14 MED ORDER — 0.9 % SODIUM CHLORIDE (POUR BTL) OPTIME
TOPICAL | Status: DC | PRN
  Administered 2022-02-14: 150 mL

## 2022-02-14 MED ORDER — ONDANSETRON HCL 4 MG/2ML IJ SOLN
INTRAMUSCULAR | Status: DC | PRN
  Administered 2022-02-14: 4 mg via INTRAVENOUS

## 2022-02-14 MED ORDER — APREPITANT 40 MG PO CAPS
ORAL_CAPSULE | ORAL | Status: AC
  Filled 2022-02-14: qty 1

## 2022-02-14 MED ORDER — FENTANYL CITRATE (PF) 100 MCG/2ML IJ SOLN
INTRAMUSCULAR | Status: DC | PRN
  Administered 2022-02-14 (×2): 50 ug via INTRAVENOUS

## 2022-02-14 MED ORDER — ESMOLOL HCL 100 MG/10ML IV SOLN
INTRAVENOUS | Status: DC | PRN
  Administered 2022-02-14: 10 mg via INTRAVENOUS
  Administered 2022-02-14: 20 mg via INTRAVENOUS
  Administered 2022-02-14 (×2): 10 mg via INTRAVENOUS

## 2022-02-14 MED ORDER — FENTANYL CITRATE (PF) 100 MCG/2ML IJ SOLN
INTRAMUSCULAR | Status: AC
  Administered 2022-02-14: 25 ug via INTRAVENOUS
  Filled 2022-02-14: qty 2

## 2022-02-14 SURGICAL SUPPLY — 39 items
CANNULA REDUC XI 12-8 STAPL (CANNULA) ×2
CANNULA REDUCER 12-8 DVNC XI (CANNULA) ×1 IMPLANT
CLIP LIGATING HEMO O LOK GREEN (MISCELLANEOUS) ×2 IMPLANT
DERMABOND ADVANCED (GAUZE/BANDAGES/DRESSINGS) ×2
DERMABOND ADVANCED .7 DNX12 (GAUZE/BANDAGES/DRESSINGS) ×1 IMPLANT
DRAPE ARM DVNC X/XI (DISPOSABLE) ×4 IMPLANT
DRAPE COLUMN DVNC XI (DISPOSABLE) ×1 IMPLANT
DRAPE DA VINCI XI ARM (DISPOSABLE) ×8
DRAPE DA VINCI XI COLUMN (DISPOSABLE) ×2
ELECT CAUTERY BLADE 6.4 (BLADE) ×2 IMPLANT
ELECT REM PT RETURN 9FT ADLT (ELECTROSURGICAL) ×2
ELECTRODE REM PT RTRN 9FT ADLT (ELECTROSURGICAL) ×1 IMPLANT
GLOVE BIO SURGEON STRL SZ7 (GLOVE) ×4 IMPLANT
GOWN STRL REUS W/ TWL LRG LVL3 (GOWN DISPOSABLE) ×4 IMPLANT
GOWN STRL REUS W/TWL LRG LVL3 (GOWN DISPOSABLE) ×8
KIT PINK PAD W/HEAD ARE REST (MISCELLANEOUS) ×2 IMPLANT
KIT PINK PAD W/HEAD ARM REST (MISCELLANEOUS) ×1 IMPLANT
MANIFOLD NEPTUNE II (INSTRUMENTS) ×2 IMPLANT
NEEDLE HYPO 22GX1.5 SAFETY (NEEDLE) ×2 IMPLANT
NS IRRIG 500ML POUR BTL (IV SOLUTION) ×2 IMPLANT
OBTURATOR OPTICAL STANDARD 8MM (TROCAR) ×2
OBTURATOR OPTICAL STND 8 DVNC (TROCAR) ×1
OBTURATOR OPTICALSTD 8 DVNC (TROCAR) ×1 IMPLANT
PACK LAP CHOLECYSTECTOMY (MISCELLANEOUS) ×2 IMPLANT
PENCIL SMOKE EVACUATOR (MISCELLANEOUS) ×1 IMPLANT
SEAL CANN UNIV 5-8 DVNC XI (MISCELLANEOUS) ×3 IMPLANT
SEAL XI 5MM-8MM UNIVERSAL (MISCELLANEOUS) ×6
SET TUBE SMOKE EVAC HIGH FLOW (TUBING) ×2 IMPLANT
SOLUTION ELECTROLUBE (MISCELLANEOUS) ×2 IMPLANT
SPIKE FLUID TRANSFER (MISCELLANEOUS) ×2 IMPLANT
SPONGE T-LAP 18X18 ~~LOC~~+RFID (SPONGE) ×2 IMPLANT
STAPLER CANNULA SEAL DVNC XI (STAPLE) ×1 IMPLANT
STAPLER CANNULA SEAL XI (STAPLE) ×2
SUT MNCRL AB 4-0 PS2 18 (SUTURE) ×2 IMPLANT
SUT VICRYL 0 AB UR-6 (SUTURE) ×4 IMPLANT
SYR 20ML LL LF (SYRINGE) ×2 IMPLANT
SYS BAG RETRIEVAL 10MM (BASKET) ×2
SYSTEM BAG RETRIEVAL 10MM (BASKET) ×1 IMPLANT
WATER STERILE IRR 500ML POUR (IV SOLUTION) ×2 IMPLANT

## 2022-02-14 NOTE — Plan of Care (Signed)

## 2022-02-14 NOTE — Anesthesia Postprocedure Evaluation (Signed)
Anesthesia Post Note  Patient: JANAH MCCULLOH  Procedure(s) Performed: XI ROBOTIC ASSISTED LAPAROSCOPIC CHOLECYSTECTOMY INDOCYANINE GREEN FLUORESCENCE IMAGING (ICG)  Patient location during evaluation: PACU Anesthesia Type: General Level of consciousness: awake and alert Pain management: pain level controlled Vital Signs Assessment: post-procedure vital signs reviewed and stable Respiratory status: spontaneous breathing, nonlabored ventilation, respiratory function stable and patient connected to nasal cannula oxygen Cardiovascular status: blood pressure returned to baseline and stable Postop Assessment: no apparent nausea or vomiting Anesthetic complications: no   No notable events documented.   Last Vitals:  Vitals:   02/14/22 1832 02/14/22 1925  BP: (!) 146/88 (!) 151/81  Pulse: 99 100  Resp: 12 16  Temp: 36.4 C 36.5 C  SpO2: 98% 98%    Last Pain:  Vitals:   02/14/22 1925  TempSrc: Oral  PainSc:                  Precious Haws Shaterria Sager

## 2022-02-14 NOTE — Assessment & Plan Note (Addendum)
K 3.6 today. Monitor and supplement as necessary. Monitor BMP, replace K PRN.

## 2022-02-14 NOTE — Transfer of Care (Signed)
Immediate Anesthesia Transfer of Care Note  Patient: [name] [name]  Procedure(s) Performed: XI ROBOTIC ASSISTED LAPAROSCOPIC CHOLECYSTECTOMY INDOCYANINE GREEN FLUORESCENCE IMAGING (ICG)  Patient Location: PACU  Anesthesia Type:General  Level of Consciousness: awake, drowsy and patient cooperative  Airway & Oxygen Therapy: Patient Spontanous Breathing and Patient connected to nasal cannula oxygen  Post-op Assessment: Report given to RN and Post -op Vital signs reviewed and stable  Post vital signs: Reviewed and stable  Last Vitals:  Vitals Value Taken Time  BP 159/81 02/14/22 1652  Temp    Pulse 106 02/14/22 1657  Resp 20 02/14/22 1657  SpO2 100 % 02/14/22 1657  Vitals shown include unvalidated device data.  Last Pain:  Vitals:   02/14/22 1451  TempSrc: Oral  PainSc: 2       Patients Stated Pain Goal: 0 (31/59/45 8592)  Complications: No notable events documented.

## 2022-02-14 NOTE — TOC Progression Note (Signed)
Transition of Care [facility]) - Progression Note    Patient Details  Name: [name] [name] MRN: [id number] Date of Birth: May 29, 1941  Transition of Care [facility]) CM/SW [place], RN Phone Number: 02/14/2022, 11:05 AM  Clinical Narrative:    Met with the patient in the room at the bedside and discussed DC plan and needs I explained that [facility] is INN and she is agreeable to have them for [facility] She will need a Rolling walker and [employer] will deliver to the bedside prior to DC [place] to her daughter's house and she has good family support NO additional TOC needs at this time   Expected Discharge Plan: [place] Barriers to Discharge: Continued Medical Work up  Expected Discharge Plan and Services Expected Discharge Plan: [place]   Discharge Planning Services: CM Consult   Living arrangements for the past 2 months: Single Family Home                 DME Arranged: Gilford Rile rolling DME Agency: [employer] Date DME Agency Contacted: 02/14/22 Time DME Agency Contacted: [phone] Representative spoke with at DME Agency: [place]: PT [place]: [place] Date [place]: 02/14/22 Time [place]: 1103 Representative spoke with at [place]: Meg   Social Determinants of Health ([place]) Interventions    Readmission Risk Interventions     No data to display

## 2022-02-14 NOTE — Progress Notes (Signed)
Physical Therapy Treatment Patient Details Name: [name] [name] MRN: [id number] DOB: [birth year] Today's Date: 02/14/2022   History of Present Illness Pt is 40 YOF admitted for syncope, hyponatremia, UTI, gallstones, and abdominal pain. PMH includes: HTN, HLD, CVA, hypothyroidism, breast CA, skin CA, diverticulosis, post herpetic neualgia, and esophagitis.    PT Comments    Pt presents to PT in bed and agreeable to participate in therapy services. Pt is motivated and put forth good effort throughout session. Pt able to perform functional mobility tasks w CGA. Session today limited by fatigue d/t NPO status for pending procedure. Pt would benefit from skilled HHPT to address above deficits in strength and activity tolerance to promote optimal return to PLOF.     Recommendations for follow up therapy are one component of a multi-disciplinary discharge planning process, led by the attending physician.  Recommendations may be updated based on patient status, additional functional criteria and insurance authorization.  Follow Up Recommendations  Home health PT     Assistance Recommended at Discharge Intermittent Supervision/Assistance  Patient can return home with the following Assistance with cooking/housework;Assist for transportation;Help with stairs or ramp for entrance   Equipment Recommendations  Rolling walker (2 wheels)    Recommendations for Other Services       Precautions / Restrictions Precautions Precautions: Fall Restrictions Weight Bearing Restrictions: No     Mobility  Bed Mobility Overal bed mobility: Modified Independent             General bed mobility comments: extra time/effort    Transfers Overall transfer level: Needs assistance Equipment used: Rolling walker (2 wheels) Transfers: Sit to/from Stand Sit to Stand: Supervision                Ambulation/Gait Ambulation/Gait assistance: Min guard Gait Distance (Feet): 30 Feet Assistive  device: Rolling walker (2 wheels) Gait Pattern/deviations: WFL(Within Functional Limits) Gait velocity: decr     General Gait Details: amb 30 ft, CGA, RW, no LOB   Stairs             Wheelchair Mobility    Modified Rankin (Stroke Patients Only)       Balance Overall balance assessment: Needs assistance Sitting-balance support: Feet unsupported Sitting balance-Leahy Scale: Fair     Standing balance support: Bilateral upper extremity supported, During functional activity, Reliant on assistive device for balance Standing balance-Leahy Scale: Fair                              Cognition Arousal/Alertness: Awake/alert Behavior During Therapy: WFL for tasks assessed/performed Overall Cognitive Status: Within Functional Limits for tasks assessed                                          Exercises Total Joint Exercises Ankle Circles/Pumps: AROM, Both, 15 reps Quad Sets: Strengthening, 15 reps Gluteal Sets: Strengthening, 15 reps Short Arc Quad: Strengthening, Both, 10 reps Bridges: Strengthening, Both, 10 reps (pt instructed not to use full ROM)    General Comments        Pertinent Vitals/Pain Pain Assessment Pain Assessment: No/denies pain Pain Score: 0-No pain Pain Intervention(s): Monitored during session    Home Living Family/patient expects to be discharged to:: Private residence Living Arrangements: Children Available Help at Discharge: Family;Available 24 hours/day Type of Home: House Home Access: Stairs to enter Entrance Stairs-Rails: None  Entrance Stairs-Number of Steps: 2 STE   Home Layout: One level        Prior Function            PT Goals (current goals can now be found in the care plan section) Acute Rehab PT Goals Patient Stated Goal: to get back to cooking PT Goal Formulation: With patient Time For Goal Achievement: 02/26/22 Potential to Achieve Goals: Good Progress towards PT goals: Progressing toward  goals    Frequency    Min 2X/week      PT Plan      Co-evaluation              AM-PAC PT "6 Clicks" Mobility   Outcome Measure  Help needed turning from your back to your side while in a flat bed without using bedrails?: None Help needed moving from lying on your back to sitting on the side of a flat bed without using bedrails?: None Help needed moving to and from a bed to a chair (including a wheelchair)?: A Little Help needed standing up from a chair using your arms (e.g., wheelchair or bedside chair)?: A Little Help needed to walk in hospital room?: A Little Help needed climbing 3-5 steps with a railing? : A Lot 6 Click Score: 19    End of Session Equipment Utilized During Treatment: Gait belt Activity Tolerance: Patient tolerated treatment well Patient left: in bed;with call bell/phone within reach;with family/visitor present   PT Visit Diagnosis: Other abnormalities of gait and mobility (R26.89);Muscle weakness (generalized) (M62.81)     Time: 2446-2863 PT Time Calculation (min) (ACUTE ONLY): 12 min  Charges:                        Glenice Laine MPH, SPT 02/14/22, 3:17 PM

## 2022-02-14 NOTE — Progress Notes (Signed)
Progress Note   Patient: [name] [name] HER:[id number] DOB: [birth year] DOA: 02/12/2022     2 DOS: the patient was seen and examined on 02/14/2022   Brief hospital course: HPI on admission: "TYKISHA AREOLA is a 81 y.o. female with medical history significant of HTN, HLD, stroke (patient is not aware of this diagnosis),hypothyroidism, left breast cancer (s/p for lumpectomy and radiation therapy), skin cancer, diverticulosis, post herpetic neuralgia, gallstone, possible esophagitis, who presents with syncope and abdominal pain.   Patient states that she has right upper quadrant abdominal pain for several months.  She was diagnosed as gallstone.  Right upper quadrant ultrasound on 01/18/2022 showed tiny gallstones without evidence of cholecystitis.  Patient was seen by Dr. Dahlia Byes of surgery on 7/24, planning to do surgery, however patient was found to have hyponatremia, therefore surgery has not been scheduled yet.  Patient states that her abdominal pain has worsened the last night. It is located in the right lower quadrant and epigastric area, constant, aching and burning-like pain, 8 out of 10 in severity, radiating to the middle back.  Associated with nausea, but no vomiting, diarrhea or abdominal pain.  No fever or chills.  Patient does not have chest pain, cough, shortness breath.  Denies symptoms of UTI. Of note, pt is started on Diflucan on 8/3 for possible esophagitis by GI."  She had a brief syncopal episode in the ED, found to be bradycardic, but not hypotensive.  Metoprolol held.  HR subsequently stable  Labs notable for hyponatremia with Na 126.  UA was concerning for infection, pt started on empiric antibiotics pending cultures.  CT head negative.    Admitted to the hospital and started on IV fluids. Nephrology consulted after Na fell to 122 after initial fluids were given.  General surgery consulted given plan for cholecystectomy.     Assessment and Plan: * Hyponatremia Na 126 on  admission.  Hypotonic.  On exam pt appears mildly hypovolemic.  Suspect due to poor oral intake and dehydration.  Pt reports drinking about three 12 oz glasses of water daily, sometimes less.  Other differential diagnosis includes SIADH.  TSH normal, free T4 mildly elevated. After initial fluids given, Na repeat was 122. Nephrology was consulted and recommended fluid restriction and salt tablets, suspecting SIADH.    8/14 - IV fluids were not stopped, and Na improved to 133. Fluids stopped late AM. 8/15 Na 130 this AM --Nephrology following --Off IV fluids --Monitor Na level closely --Salt tablets started per nephro  --Fluid restriction --Repeat BMP this afternoon    Syncope Etiology is not clear, suspect bradycardic episode or vasovagal syncope, orthostatic, TIA/stroke, cardiac arrhythmia, ACS (less likely, given no chest pain and normal trop) MRI brain negative. Echo with normal EF, mild MR. -- Orthostatic vital signs  -- Neuro checks  --PT/OT --Telemetry  Cholelithiasis RUQ ultrasound on 01/18/2022 showed tiny gallstone, without evidence of cholecystitis.  On admission, no fever or leukocytosis.  Lipase normal 35, liver function normal. --General surgery following --Chole today --Supportive care - antiemetics, pain control PRN    Abdominal pain Likely due to gallstone/cholecystitis.   CTA negative for dissection. --General surgery following --Surgery this afternoon   History of CVA (cerebrovascular accident) Stroke is listed in medical problem list, but the patient is not aware of having prior stroke.   Not taking aspirin or pravastatin currently. MRI brain no acute findings, mild small vessel ischemic diesase. --Hold off aspirin due to possible esophagitis    Hypertension Patient reports not  taking Cozaar. BP's mildly elevated --IV hydralazine as needed for now  HLD (hyperlipidemia) Patient is not taking pravastatin -Follow-up with PCP  UTI (urinary tract  infection) Empiric IV Rocephin -Follow-up urine culture  Hypothyroidism, adult Patient is not taking medications currently TSH normal. Free T4 mildly elevated. PCP follow up.  Hypokalemia K 3.4 today, replace with 40 mEq oral KCl. Monitor BMP, replace K PRN  Esophagitis - Continue Diflucan -Continue Protonix and Carafate        Subjective: Pt sitting up in bed, seen before surgery today.  Reports she feels much better today.  No N/V.    Physical Exam: Vitals:   02/14/22 1746 02/14/22 1752 02/14/22 1800 02/14/22 1810  BP: (!) 148/87  130/86 (!) 146/88  Pulse: 100 95 96 95  Resp: 16 (!) '9 14 12  '$ Temp: 97.7 F (36.5 C)     TempSrc:      SpO2: 98% 96% 98% 97%  Weight:      Height:       General exam: awake, alert, no acute distress HEENT: atraumatic, clear conjunctiva, anicteric sclera, moist mucus membranes, hearing grossly normal  Respiratory system: CTAB, no wheezes, rales or rhonchi, normal respiratory effort. Cardiovascular system: normal S1/S2, RRR, no JVD, murmurs, rubs, gallops, no pedal edema.   Gastrointestinal system: soft, mild RUQ tenderness no rebound or guarding. Central nervous system: A&O x4. no gross focal neurologic deficits, normal speech Extremities: moves all, no edema, normal tone Skin: dry, intact, normal temperature Psychiatry: normal mood, congruent affect, judgement and insight appear normal   Data Reviewed:  Notable labs -- Na 130, K 3.4, glucose 109, BUN 6,     Family Communication: daughter at bedside on rounds 8/14  Disposition: Status is: Inpatient Remains inpatient appropriate because: Ongoing evaluation not appropriate for outpatient setting.  Surgery today.  Closely monitor sodium level.   Planned Discharge Destination: Home    Time spent: 40 minutes  Author: Ezekiel Slocumb, DO 02/14/2022 6:28 PM  For on call review [email or web address].

## 2022-02-14 NOTE — Progress Notes (Signed)
[name] Day(s): 2.   Interval History:  Patient seen and examined No acute events or new complaints overnight.  Patient reports she is pain free this morning Some nausea when she got up No fever, chills, emesis Sodium improved to 134 yesterday afternoon; most recent 130 at 1900 She is NPO Plan for robotic assisted laparoscopic cholecystectomy this afternoon with Dr Dahlia Byes   Vital signs in last 24 hours: [min-max] current  Temp:  [97.9 F (36.6 C)-98.2 F (36.8 C)] 98.1 F (36.7 C) (08/15 0502) Pulse Rate:  [80-83] 83 (08/15 0502) Resp:  [16] 16 (08/15 0502) BP: (149-168)/(76-84) 149/78 (08/15 0502) SpO2:  [98 %-100 %] 100 % (08/15 0502)     Height: 5' (152.4 cm) Weight: 49 kg BMI (Calculated): 21.09   Intake/Output last 2 shifts:  08/14 0701 - 08/15 0700 In: 480 [P.O.:480] Out: -    Physical Exam:  Constitutional: alert, cooperative and no distress  Respiratory: breathing non-labored at rest  Cardiovascular: regular rate and sinus rhythm  Gastrointestinal: soft, non-tender, and non-distended Integumentary: war, dry   Labs:     Latest Ref Rng & Units 02/12/2022    4:32 AM 01/18/2022    3:10 AM 01/13/2022   10:59 AM  CBC  WBC 4.0 - 10.5 K/uL 10.2  7.3  6.0   Hemoglobin 12.0 - 15.0 g/dL 12.6  12.2  12.7   Hematocrit 36.0 - 46.0 % 37.0  35.3  37.6   Platelets 150 - 400 K/uL 407  339  365       Latest Ref Rng & Units 02/13/2022    7:44 PM 02/13/2022    2:56 PM 02/13/2022    7:54 AM  CMP  Glucose 70 - 99 mg/dL 157  129  90   BUN 8 - 23 mg/dL '8  10  6   '$ Creatinine 0.44 - 1.00 mg/dL 0.58  0.54  0.56   Sodium 135 - 145 mmol/L 130  134  133   Potassium 3.5 - 5.1 mmol/L 3.6  3.3  3.7   Chloride 98 - 111 mmol/L 99  103  102   CO2 22 - 32 mmol/L '24  25  24   '$ Calcium 8.9 - 10.3 mg/dL 8.7  8.4  8.8      Imaging studies: No new pertinent imaging studies   Assessment/Plan: (ICD-10's: K80.20) 81 y.o. female with  symptomatic cholelithiasis, complicated by, now improved, hyponatremia.   - Will plan for robotic assisted laparoscopic cholecystectomy this afternoon with Dr Dahlia Byes pending OR/Anesthesia availability    - All risks, benefits, and alternatives to above procedure(s) were discussed with the patient, all of her questions were answered to her expressed satisfaction, patient expresses she wishes to proceed, and informed consent was obtained.    - NPO for procedure; IVF support - She is on IV Abx (Rocephin) for UTI - Monitor abdominal examination - Pain control prn; antiemetics prn  - Mobilize as tolerated - Further management per primary service; we will follow    All of the above findings and recommendations were discussed with the patient, and the medical team, and all of patient's questions were answered to her expressed satisfaction.  -- Edison Simon, PA-C [facility] 02/14/2022, 7:40 AM M-F: 7am - 4pm

## 2022-02-14 NOTE — TOC Progression Note (Signed)
Transition of Care [facility]) - Progression Note    Patient Details  Name: [name] [name] MRN: [id number] Date of Birth: 1940-11-04  Transition of Care [facility]) CM/SW [place], RN Phone Number: 02/14/2022, 9:36 AM  Clinical Narrative:    [facility] with her insurance and can accept her for [facility]        Expected Discharge Plan and Services                                                 Social Determinants of Health (SDOH) Interventions    Readmission Risk Interventions     No data to display

## 2022-02-14 NOTE — Progress Notes (Signed)
Occupational Therapy Treatment Patient Details Name: [name] [name] MRN: [id number] DOB: 1940-10-25 Today's Date: 02/14/2022   History of present illness Pt is 89 YOF admitted for syncope, hyponatremia, UTI, gallstones, and abdominal pain. PMH includes: HTN, HLD, CVA, hypothyroidism, breast CA, skin CA, diverticulosis, post herpetic neualgia, and esophagitis.   OT comments  Upon arrival, pt resting in bed with family present. OT tx session focused on energy conservation techniques to improve activity tolerance and safety with ADLs. Education included the 4 P's (plan, prioritize, pace, position) of energy conservation, pursed lip breathing, diaphragmatic breathing, and specific strategies for ADLs (eating, grooming, bathing/showering, dressing). OT discussed specific examples of sitting when possible to complete self-care tasks (I.e. dressing, bathing), gather all the necessary items in advance, avoid bending and reaching, listen to your body and take rest breaks before you feel tired. Pt and family demonstrated good carryover of education provided by OT. Pt will benefit from further opportunities to practice implementing energy conservation techniques during self-care tasks before returning home. Pt is making progress toward goal completion. D/C recommendation remains appropriate. OT will continue to follow acutely.    Recommendations for follow up therapy are one component of a multi-disciplinary discharge planning process, led by the attending physician.  Recommendations may be updated based on patient status, additional functional criteria and insurance authorization.    Follow Up Recommendations  No OT follow up    Assistance Recommended at Discharge Set up Supervision/Assistance  Patient can return home with the following  Help with stairs or ramp for entrance;A little help with bathing/dressing/bathroom;A little help with walking and/or transfers;Assistance with cooking/housework;Assist for  transportation   Equipment Recommendations  None recommended by OT    Recommendations for Other Services      Precautions / Restrictions Precautions Precautions: Fall Restrictions Weight Bearing Restrictions: No       Mobility Bed Mobility               General bed mobility comments: Not addressed this date    Transfers                   General transfer comment: Not addressed this date     Balance       Sitting balance - Comments: Not addressed this date       Standing balance comment: Not addressed this date                           ADL either performed or assessed with clinical judgement   ADL Overall ADL's : Needs assistance/impaired                                       General ADL Comments: Focused on energy conservation strategies for ADL tasks    Extremity/Trunk Assessment Upper Extremity Assessment Upper Extremity Assessment: Overall WFL for tasks assessed   Lower Extremity Assessment Lower Extremity Assessment: Generalized weakness   Cervical / Trunk Assessment Cervical / Trunk Assessment: Normal    Vision Baseline Vision/History: 1 Wears glasses Patient Visual Report: No change from baseline     Perception     Praxis      Cognition Arousal/Alertness: Awake/alert Behavior During Therapy: WFL for tasks assessed/performed Overall Cognitive Status: Within Functional Limits for tasks assessed  Exercises      Shoulder Instructions       General Comments      Pertinent Vitals/ Pain       Pain Assessment Pain Assessment: 0-10  Home Living                                          Prior Functioning/Environment              Frequency  Min 2X/week        Progress Toward Goals  OT Goals(current goals can now be found in the care plan section)  Progress towards OT goals: Progressing toward goals  Acute  Rehab OT Goals Patient Stated Goal: return home OT Goal Formulation: With patient/family Time For Goal Achievement: 02/27/22 Potential to Achieve Goals: Good  Plan Discharge plan remains appropriate;Frequency remains appropriate    Co-evaluation                 AM-PAC OT "6 Clicks" Daily Activity     Outcome Measure   Help from another person eating meals?: None Help from another person taking care of personal grooming?: A Little Help from another person toileting, which includes using toliet, bedpan, or urinal?: A Little Help from another person bathing (including washing, rinsing, drying)?: A Little Help from another person to put on and taking off regular upper body clothing?: None Help from another person to put on and taking off regular lower body clothing?: None 6 Click Score: 21    End of Session    OT Visit Diagnosis: Unsteadiness on feet (R26.81);Muscle weakness (generalized) (M62.81);Pain   Activity Tolerance Patient tolerated treatment well   Patient Left in bed;with call bell/phone within reach;with bed alarm set;with family/visitor present   Nurse Communication Mobility status        Time: 2595-6387 OT Time Calculation (min): 11 min  Charges: OT General Charges $OT Visit: 1 Visit OT Treatments $Therapeutic Activity: 8-22 mins  Acadia Montana MS, OTR/L ascom [phone]  02/14/22, 1:31 PM

## 2022-02-14 NOTE — Anesthesia Preprocedure Evaluation (Signed)
Anesthesia Evaluation  Patient identified by MRN, date of birth, ID band Patient awake    Reviewed: Allergy & Precautions, NPO status , Patient's Chart, lab work & pertinent test results  History of Anesthesia Complications (+) PONV and history of anesthetic complications  Airway Mallampati: III  TM Distance: <3 FB Neck ROM: full    Dental  (+) Chipped   Pulmonary neg shortness of breath,    Pulmonary exam normal        Cardiovascular Exercise Tolerance: Good hypertension, (-) angina(-) Past MI and (-) DOE Normal cardiovascular exam     Neuro/Psych CVA negative psych ROS   GI/Hepatic Neg liver ROS, GERD  Controlled,  Endo/Other  Hypothyroidism   Renal/GU      Musculoskeletal   Abdominal   Peds  Hematology negative hematology ROS (+)   Anesthesia Other Findings Past Medical History: No date: Blood transfusion without reported diagnosis 2009: Breast cancer ([place])     Comment:  left breast, radiation No date: Diverticulosis No date: Diverticulosis No date: H/O bone density study     Comment:  05/30/06; 06/04/08; 06/05/12 No date: Hemorrhoids No date: History of bladder infections No date: History of chicken pox No date: History of CVA (cerebrovascular accident) No date: Hyperlipidemia No date: Hypertension No date: Personal history of radiation therapy No date: PONV (postoperative nausea and vomiting) 07/19/2021: SCC (squamous cell carcinoma)     Comment:  left forearm anterior, [facility] 08/18/2021 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 No date: Trigger finger  Past Surgical History: No date: ABDOMINAL HYSTERECTOMY 1954: APPENDECTOMY 1986: BREAST BIOPSY; Left     Comment:  neg 1988: BREAST BIOPSY; Left     Comment:  neg 1995: BREAST BIOPSY; Left     Comment:  neg 2009: BREAST BIOPSY; Left     Comment:  positive (ultrasound guided  biopsy) No date: BREAST LUMPECTOMY 02/18/2021: BROW LIFT     Comment:  Procedure: BLEPHAROPTOSIS REPAIR; RESECT EX BILATERAL;                Surgeon: Karle Starch, MD;  Location: [facility];  Service: Ophthalmology;; 07/31/2013: COLONOSCOPY 01/10/2016: COLONOSCOPY WITH PROPOFOL; N/A     Comment:  Procedure: COLONOSCOPY WITH PROPOFOL;  Surgeon: Manya Silvas, MD;  Location: [facility] ENDOSCOPY;  Service:               Endoscopy;  Laterality: N/A; 1/292015: ESOPHAGOGASTRODUODENOSCOPY 01/09/2016: ESOPHAGOGASTRODUODENOSCOPY; N/A     Comment:  Procedure: ESOPHAGOGASTRODUODENOSCOPY (EGD);  Surgeon:               Manya Silvas, MD;  Location: [facility] ENDOSCOPY;                Service: Endoscopy;  Laterality: N/A; 02/02/2022: ESOPHAGOGASTRODUODENOSCOPY (EGD) WITH PROPOFOL; N/A     Comment:  Procedure: ESOPHAGOGASTRODUODENOSCOPY (EGD) WITH               PROPOFOL;  Surgeon: Annamaria Helling, DO;  Location:              [facility] ENDOSCOPY;  Service: Gastroenterology;  Laterality:               N/A; No date: eye lid lift No date: [place]: TONSILLECTOMY 2009, 2006: WRIST SURGERY  Comment:  ganglion cysts removal  BMI    Body Mass Index: 21.09 kg/m      Reproductive/Obstetrics negative OB ROS                             Anesthesia Physical Anesthesia Plan  ASA: 3  Anesthesia Plan: General ETT   Post-op Pain Management:    Induction: Intravenous  PONV Risk Score and Plan: Ondansetron, Dexamethasone, Midazolam and Treatment may vary due to age or medical condition  Airway Management Planned: Oral ETT  Additional Equipment:   Intra-op Plan:   Post-operative Plan: Extubation in OR  Informed Consent: I have reviewed the patients History and Physical, chart, labs and discussed the procedure including the risks, benefits and alternatives for the proposed anesthesia with the patient or authorized  representative who has indicated his/her understanding and acceptance.   Patient has DNR.  Discussed DNR with patient and Suspend DNR.   Dental Advisory Given  Plan Discussed with: Anesthesiologist, CRNA and Surgeon  Anesthesia Plan Comments: (Patient consented for risks of anesthesia including but not limited to:  - adverse reactions to medications - damage to eyes, teeth, lips or other oral mucosa - nerve damage due to positioning  - sore throat or hoarseness - Damage to heart, brain, nerves, lungs, other parts of body or loss of life  Patient voiced understanding.)        Anesthesia Quick Evaluation

## 2022-02-14 NOTE — Anesthesia Procedure Notes (Signed)
Procedure Name: Intubation Date/Time: 02/14/2022 4:09 PM  Performed by: Kelton Pillar, CRNAPre-anesthesia Checklist: Patient identified, Emergency Drugs available, Suction available and Patient being monitored Patient Re-evaluated:Patient Re-evaluated prior to induction Oxygen Delivery Method: Circle system utilized Preoxygenation: Pre-oxygenation with 100% oxygen Induction Type: IV induction Ventilation: Mask ventilation without difficulty Laryngoscope Size: McGraph and 3 Grade View: Grade I Tube type: Oral Tube size: 6.5 mm Number of attempts: 1 Airway Equipment and Method: Stylet and Oral airway Placement Confirmation: ETT inserted through vocal cords under direct vision, positive ETCO2, breath sounds checked- equal and bilateral and CO2 detector Secured at: 21 cm Tube secured with: Tape Dental Injury: Teeth and Oropharynx as per pre-operative assessment

## 2022-02-14 NOTE — Op Note (Signed)
Robotic assisted laparoscopic Cholecystectomy  Pre-operative Diagnosis: chronic cholecystitis  Post-operative Diagnosis: same  Procedure:  Robotic assisted laparoscopic Cholecystectomy  Surgeon: Caroleen Hamman, MD FACS  Anesthesia: Gen. with endotracheal tube  Findings: Mild Chronic  Cholecystitis   Estimated Blood Loss: 10cc       Specimens: Gallbladder           Complications: none   Procedure Details  The patient was seen again in the Holding Room. The benefits, complications, treatment options, and expected outcomes were discussed with the patient. The risks of bleeding, infection, recurrence of symptoms, failure to resolve symptoms, bile duct damage, bile duct leak, retained common bile duct stone, bowel injury, any of which could require further surgery and/or ERCP, stent, or papillotomy were reviewed with the patient. The likelihood of improving the patient's symptoms with return to their baseline status is good.  The patient and/or family concurred with the proposed plan, giving informed consent.  The patient was taken to Operating Room, identified  and the procedure verified as Laparoscopic Cholecystectomy.  A Time Out was held and the above information confirmed.  Prior to the induction of general anesthesia, antibiotic prophylaxis was administered. VTE prophylaxis was in place. General endotracheal anesthesia was then administered and tolerated well. After the induction, the abdomen was prepped with Chloraprep and draped in the sterile fashion. The patient was positioned in the supine position.  Cut down technique was used to enter the abdominal cavity and a Hasson trochar was placed after two vicryl stitches were anchored to the fascia. Pneumoperitoneum was then created with CO2 and tolerated well without any adverse changes in the patient's vital signs.  Three 8-mm ports were placed under direct vision. All skin incisions  were infiltrated with a local anesthetic agent before  making the incision and placing the trocars.   The patient was positioned  in reverse Trendelenburg, robot was brought to the surgical field and docked in the standard fashion.  We made sure all the instrumentation was kept indirect view at all times and that there were no collision between the arms. I scrubbed out and went to the console.  The gallbladder was identified, the fundus grasped and retracted cephalad. Adhesions were lysed bluntly. The infundibulum was grasped and retracted laterally, exposing the peritoneum overlying the triangle of Calot. This was then divided and exposed in a blunt fashion. An extended critical view of the cystic duct and cystic artery was obtained.  The cystic duct was clearly identified and bluntly dissected.   Artery and duct were double clipped and divided. Using ICG cholangiography we visualize the cystic duct and the CBD, no evidence of bile injuries. The gallbladder was taken from the gallbladder fossa in a retrograde fashion with the electrocautery.  Hemostasis was achieved with the electrocautery. nspection of the right upper quadrant was performed. No bleeding, bile duct injury or leak, or bowel injury was noted. Robotic instruments and robotic arms were undocked in the standard fashion.  I scrubbed back in.  The gallbladder was removed and placed in an Endocatch bag.   Pneumoperitoneum was released.  The periumbilical port site was closed with interrumpted 0 Vicryl sutures. 4-0 subcuticular Monocryl was used to close the skin. Dermabond was  applied.  The patient was then extubated and brought to the recovery room in stable condition. Sponge, lap, and needle counts were correct at closure and at the conclusion of the case.               [place],  MD, FACS

## 2022-02-14 NOTE — Progress Notes (Signed)
       CROSS COVER NOTE  NAME: MASHA ORBACH MRN: [id number] DOB : July 28, 1940    Date of Service   02/14/22  HPI/Events of Note   Medication request received for sleep aid.  Interventions   Plan: 5 mg Melatonin     This document was prepared using Dragon voice recognition software and may include unintentional dictation errors.  Neomia Glass DNP, MHA, FNP-BC Nurse Practitioner [facility] Pager [phone]

## 2022-02-15 DIAGNOSIS — E871 Hypo-osmolality and hyponatremia: Secondary | ICD-10-CM | POA: Diagnosis not present

## 2022-02-15 LAB — HEPATIC FUNCTION PANEL
ALT: 37 U/L (ref 0–44)
AST: 50 U/L — ABNORMAL HIGH (ref 15–41)
Albumin: 4.1 g/dL (ref 3.5–5.0)
Alkaline Phosphatase: 49 U/L (ref 38–126)
Bilirubin, Direct: 0.1 mg/dL (ref 0.0–0.2)
Indirect Bilirubin: 0.4 mg/dL (ref 0.3–0.9)
Total Bilirubin: 0.5 mg/dL (ref 0.3–1.2)
Total Protein: 6.8 g/dL (ref 6.5–8.1)

## 2022-02-15 LAB — CBC
HCT: 34.4 % — ABNORMAL LOW (ref 36.0–46.0)
Hemoglobin: 12.3 g/dL (ref 12.0–15.0)
MCH: 30.3 pg (ref 26.0–34.0)
MCHC: 35.8 g/dL (ref 30.0–36.0)
MCV: 84.7 fL (ref 80.0–100.0)
Platelets: 397 10*3/uL (ref 150–400)
RBC: 4.06 MIL/uL (ref 3.87–5.11)
RDW: 13.4 % (ref 11.5–15.5)
WBC: 12 10*3/uL — ABNORMAL HIGH (ref 4.0–10.5)
nRBC: 0 % (ref 0.0–0.2)

## 2022-02-15 LAB — BASIC METABOLIC PANEL
Anion gap: 9 (ref 5–15)
BUN: 6 mg/dL — ABNORMAL LOW (ref 8–23)
CO2: 25 mmol/L (ref 22–32)
Calcium: 9.4 mg/dL (ref 8.9–10.3)
Chloride: 91 mmol/L — ABNORMAL LOW (ref 98–111)
Creatinine, Ser: 0.48 mg/dL (ref 0.44–1.00)
GFR, Estimated: >60 mL/min (ref >60)
Glucose, Bld: 164 mg/dL — ABNORMAL HIGH (ref 70–99)
Potassium: 4 mmol/L (ref 3.5–5.1)
Sodium: 125 mmol/L — ABNORMAL LOW (ref 135–145)

## 2022-02-15 LAB — SODIUM: Sodium: 127 mmol/L — ABNORMAL LOW (ref 135–145)

## 2022-02-15 LAB — MAGNESIUM: Magnesium: 1.7 mg/dL (ref 1.7–2.4)

## 2022-02-15 MED ORDER — TOLVAPTAN 15 MG PO TABS
15.0000 mg | ORAL_TABLET | Freq: Once | ORAL | Status: AC
  Administered 2022-02-15: 15 mg via ORAL
  Filled 2022-02-15: qty 1

## 2022-02-15 NOTE — Plan of Care (Signed)

## 2022-02-15 NOTE — Care Management Important Message (Signed)
Important Message  Patient Details  Name: [name] [name] MRN: [id number] Date of Birth: 1941/06/25   Medicare Important Message Given:  Yes     [name] 02/15/2022, 10:22 AM

## 2022-02-15 NOTE — Progress Notes (Signed)
Patient is AOX4 but awake all night, trying to remember something at home, daughter is at the bedside. No confusion. Voiding frequently with 17m. Bladder scan showing [id number]. Hospitalist informed via secure chat.

## 2022-02-15 NOTE — Discharge Instructions (Addendum)
In addition to included general post-operative instructions,  Diet: Resume home diet. Recommend avoiding or limiting fatty/greasy foods over the next few days/week. If you do eat these, you may (or may not) notice diarrhea. This is expected while your body adjusts to not having a gallbladder, and it typically resolves with time.    Activity: No heavy lifting >20 pounds (children, pets, laundry, garbage) for 6 weeks, but light activity and walking are encouraged. Do not drive or drink alcohol if taking narcotic pain medications or having pain that might distract from driving.  Wound care: You may shower/get incision wet with soapy water and pat dry (do not rub incisions), but no baths or submerging incision underwater until follow-up.   Medications: Resume all home medications. For mild to moderate pain: acetaminophen (Tylenol) or ibuprofen/naproxen (if no kidney disease). Combining Tylenol with alcohol can substantially increase your risk of causing liver disease. Narcotic pain medications, if prescribed, can be used for severe pain, though may cause nausea, constipation, and drowsiness. Do not combine Tylenol and Percocet (or similar) within a 6 hour period as Percocet (and similar) contain(s) Tylenol. If you do not need the narcotic pain medication, you do not need to fill the prescription.  Call office [phone]) at any time if any questions, worsening pain, fevers/chills, bleeding, drainage from incision site, or other concerns. Laparoscopic Cholecystectomy, Care After   These instructions give you information on caring for yourself after your procedure. Your doctor may also give you more specific instructions. Call your doctor if you have any problems or questions after your procedure.  HOME CARE  Change your bandages (dressings) as told by your doctor.  Keep the wound dry and clean. Wash the wound gently with soap and water. Pat the wound dry with a clean towel.  Do not take baths, swim,  or use hot tubs for 2 weeks, or as told by your doctor.  Only take medicine as told by your doctor.  Eat a normal diet as told by your doctor.  Do not lift anything heavier than 10 pounds (4.5 kg) until your doctor says it is okay.  Do not play contact sports for 1 week, or as told by your doctor. GET HELP IF:  Your wound is red, puffy (swollen), or painful.  You have yellowish-white fluid (pus) coming from the wound.  You have fluid draining from the wound for more than 1 day.  You have a bad smell coming from the wound.  Your wound breaks open. GET HELP RIGHT AWAY IF:  You have trouble breathing.  You have chest pain.  You have a fever >101  You have pain in the shoulders (shoulder strap areas) that is getting worse.  You feel dizzy or pass out (faint).  You have severe belly (abdominal) pain.  You feel sick to your stomach (nauseous) or throw up (vomit) for more than 1 day.

## 2022-02-15 NOTE — Progress Notes (Signed)
[facility] Hospital Day(s): 3.   Post op day(s): 1 Day Post-Op.   Interval History:  Patient seen and examined Overnight had some confusion it seems, otherwise did well Patient reports she has abdominal soreness; confusion improving No fever, chills, nausea, emesis  Leukocytosis to 12.0K; likely reactive from OR Still with hyponatremia; now down to 125 (trend 133 --> 129 --> 125) Renal function normal; sCr - 0.48; UO - 400 ccs + unmeasured No other significant electrolyte derangements She is on soft diet  Vital signs in last 24 hours: [min-max] current  Temp:  [97 F (36.1 C)-98.2 F (36.8 C)] 97.9 F (36.6 C) (08/16 0400) Pulse Rate:  [83-119] 93 (08/16 0400) Resp:  [9-22] 16 (08/16 0400) BP: (130-169)/(72-92) 158/89 (08/16 0400) SpO2:  [96 %-100 %] 97 % (08/16 0400)     Height: 5' (152.4 cm) Weight: 49 kg BMI (Calculated): 21.09   Intake/Output last 2 shifts:  08/15 0701 - 08/16 0700 In: 500 [I.V.:500] Out: 400 [Urine:400]   Physical Exam:  Constitutional: alert, cooperative and no distress  Respiratory: breathing non-labored at rest  Cardiovascular: regular rate and sinus rhythm  Gastrointestinal: soft, incisional soreness, non-distended, no rebound/guarding Neurological: A&O x4, no focal neurologic deficits  Integumentary: Laparoscopic incisions are CDI with dermabond, no erythema or drainage   Labs:     Latest Ref Rng & Units 02/15/2022    5:29 AM 02/12/2022    4:32 AM 01/18/2022    3:10 AM  CBC  WBC 4.0 - 10.5 K/uL 12.0  10.2  7.3   Hemoglobin 12.0 - 15.0 g/dL 12.3  12.6  12.2   Hematocrit 36.0 - 46.0 % 34.4  37.0  35.3   Platelets 150 - 400 K/uL 397  407  339       Latest Ref Rng & Units 02/15/2022    5:29 AM 02/14/2022    7:57 PM 02/14/2022    7:47 AM  CMP  Glucose 70 - 99 mg/dL 164  230  109   BUN 8 - 23 mg/dL '6  7  6   '$ Creatinine 0.44 - 1.00 mg/dL 0.48  0.65  0.56   Sodium 135 - 145 mmol/L 125  129  133    Potassium 3.5 - 5.1 mmol/L 4.0  3.9  3.4   Chloride 98 - 111 mmol/L 91  96  101   CO2 22 - 32 mmol/L '25  22  25   '$ Calcium 8.9 - 10.3 mg/dL 9.4  8.8  8.9      Imaging studies: No new pertinent imaging studies   Assessment/Plan:  81 y.o. female with persistent hyponatremia 1 Day Post-Op s/p robotic assisted laparoscopic cholecystectomy for chronic cholecystitis, complicated by hyponatremia.   - Okay to continue soft diet; Reviewed gallbladder diet recommendations to avoid/limit diarrhea  - Suspect confusion is multifactorial; anesthesia, hyponatremia, hospital delirium   - Monitor abdominal examination - Pain control prn; antiemetics prn   - Monitor hyponatremia; worse this morning; nephrology on board - Mobilization as tolerated   - Further management per primary service  - Discharge Planning: She is doing well from surgical aspect. Main issues this morning are confusion and hyponatremia. I suspect she will benefit from at least another 24 hours in house given medical issues.    All of the above findings and recommendations were discussed with the patient, patient's family (daughter at bedside), and the medical team, and all of patient's and family's questions were answered to their expressed  satisfaction.  -- Edison Simon, PA-C [facility] 02/15/2022, 7:32 AM M-F: 7am - 4pm

## 2022-02-15 NOTE — Evaluation (Signed)
Physical Therapy Re-Evaluation Patient Details Name: [name] [name] MRN: [id number] DOB: 05-Feb-1941 Today's Date: 02/15/2022  History of Present Illness  Pt is 49 YOF admitted for syncope, hyponatremia, UTI, gallstones, and abdominal pain. PMH includes: HTN, HLD, CVA, hypothyroidism, breast CA, skin CA, diverticulosis, post herpetic neualgia, and esophagitis. 02/15/22: pt is s/p laparoscopic cholecystectomy.  Clinical Impression  Pt presents to PT in bed and agreeable to participate in therapy services. Pt presented w confusion/deficits in orientation, w overall cognition significantly different than baseline. Required verbal and tactile cueing and physical assistance for bed mobility, transfers, and amb tasks, as well as physical assistance w AD negotiation. Will continue to monitor closely with the assumption that pt's function will improve with her cognition. Prior goals remain appropriate.  Pt would benefit from skilled HHPT to address above deficits in strength, functional mobility, and activity tolerance to promote optimal return to PLOF.    Recommendations for follow up therapy are one component of a multi-disciplinary discharge planning process, led by the attending physician.  Recommendations may be updated based on patient status, additional functional criteria and insurance authorization.  Follow Up Recommendations Home health PT      Assistance Recommended at Discharge Frequent or constant Supervision/Assistance  Patient can return home with the following  Assistance with cooking/housework;Assist for transportation;Help with stairs or ramp for entrance;A lot of help with walking and/or transfers;A lot of help with bathing/dressing/bathroom;Direct supervision/assist for medications management;Direct supervision/assist for financial management    Equipment Recommendations Rolling walker (2 wheels)  Recommendations for Other Services       Functional Status Assessment Patient has had  a recent decline in their functional status and demonstrates the ability to make significant improvements in function in a reasonable and predictable amount of time.     Precautions / Restrictions Precautions Precautions: Fall Restrictions Weight Bearing Restrictions: No      Mobility  Bed Mobility Overal bed mobility: Needs Assistance Bed Mobility: Supine to Sit, Sit to Supine     Supine to sit: Mod assist Sit to supine: Max assist   General bed mobility comments: significant verbal and tactile cueing required to perform log roll to sit EOB.    Transfers Overall transfer level: Needs assistance Equipment used: Rolling walker (2 wheels) Transfers: Sit to/from Stand Sit to Stand: Mod assist                Ambulation/Gait Ambulation/Gait assistance: Min assist Gait Distance (Feet): 20 Feet Assistive device: Rolling walker (2 wheels) Gait Pattern/deviations: Step-to pattern, Narrow base of support, Drifts right/left Gait velocity: decr     General Gait Details: deficies in spatial awareness. struck obstacles in path w AD.  Stairs            Wheelchair Mobility    Modified Rankin (Stroke Patients Only)       Balance Overall balance assessment: Needs assistance Sitting-balance support: Feet supported Sitting balance-Leahy Scale: Poor     Standing balance support: Bilateral upper extremity supported, During functional activity, Reliant on assistive device for balance Standing balance-Leahy Scale: Poor                               Pertinent Vitals/Pain Pain Assessment Pain Assessment: No/denies pain Pain Score: 0-No pain    Home Living Family/patient expects to be discharged to:: Private residence Living Arrangements: Children Available Help at Discharge: Family;Available 24 hours/day Type of Home: House Home Access: Stairs to enter Entrance  Stairs-Rails: None Entrance Stairs-Number of Steps: 2 STE   Home Layout: One  level Home Equipment: Conservation officer, nature (2 wheels);Cane - single point Additional Comments: has AD but has not used them    Prior Function Prior Level of Function : Independent/Modified Independent             Mobility Comments: (I) w mobility ADLs Comments: (I) w ADLs/IADLS     Hand Dominance        Extremity/Trunk Assessment   Upper Extremity Assessment Upper Extremity Assessment: Difficult to assess due to impaired cognition    Lower Extremity Assessment Lower Extremity Assessment: Generalized weakness    Cervical / Trunk Assessment Cervical / Trunk Assessment: Normal  Communication   Communication: No difficulties  Cognition Arousal/Alertness: Lethargic Behavior During Therapy: WFL for tasks assessed/performed, Impulsive Overall Cognitive Status: Impaired/Different from baseline Area of Impairment: Orientation, Following commands, Safety/judgement, Awareness                 Orientation Level: Disoriented     Following Commands: Follows one step commands inconsistently Safety/Judgement: Decreased awareness of safety, Decreased awareness of deficits     General Comments: cognition significantly impacted compared to tx on 02/14/22. evident confusion. unable to hold conversation.        General Comments      Exercises     Assessment/Plan    PT Assessment Patient needs continued PT services  PT Problem List Decreased strength;Decreased activity tolerance;Decreased balance;Decreased mobility;Decreased safety awareness       PT Treatment Interventions DME instruction;Therapeutic exercise;Gait training;Balance training;Neuromuscular re-education;Functional mobility training;Therapeutic activities;Patient/family education    PT Goals (Current goals can be found in the Care Plan section)  Acute Rehab PT Goals Patient Stated Goal: to get back to cooking PT Goal Formulation: With patient Time For Goal Achievement: 02/26/22 Potential to Achieve Goals:  Good    Frequency Min 2X/week     Co-evaluation               AM-PAC PT "6 Clicks" Mobility  Outcome Measure Help needed turning from your back to your side while in a flat bed without using bedrails?: A Little Help needed moving from lying on your back to sitting on the side of a flat bed without using bedrails?: A Lot Help needed moving to and from a bed to a chair (including a wheelchair)?: A Lot Help needed standing up from a chair using your arms (e.g., wheelchair or bedside chair)?: A Little Help needed to walk in hospital room?: A Lot Help needed climbing 3-5 steps with a railing? : Total 6 Click Score: 13    End of Session Equipment Utilized During Treatment: Gait belt Activity Tolerance: Patient limited by lethargy Patient left: in bed;with call bell/phone within reach;with family/visitor present;with SCD's reapplied   PT Visit Diagnosis: Other abnormalities of gait and mobility (R26.89);Muscle weakness (generalized) (M62.81)    Time: 3419-3790 PT Time Calculation (min) (ACUTE ONLY): 27 min   Charges:             Glenice Laine MPH, SPT 02/15/22, 3:59 PM

## 2022-02-15 NOTE — Progress Notes (Signed)
Pt is still mumbling something, she's trying to say something that she can't think about. She still knows where she is, she knows why she is here, she knows pretty much everything. Informed Hospitalist of situation. Pt is known to be hyponatremic and currently have sodium levels of 125. Daughter is at the bedside still.

## 2022-02-15 NOTE — Progress Notes (Signed)
PROGRESS NOTE  [name] [name] KKX:[id number] DOB: 1941-05-27 DOA: 02/12/2022 PCP: Idelle Crouch, MD  Brief History   HPI on admission: "[name] [name] is a 81 y.o. female with medical history significant of HTN, HLD, stroke (patient is not aware of this diagnosis),hypothyroidism, left breast cancer (s/p for lumpectomy and radiation therapy), skin cancer, diverticulosis, post herpetic neuralgia, gallstone, possible esophagitis, who presents with syncope and abdominal pain.   Patient states that she has right upper quadrant abdominal pain for several months.  She was diagnosed as gallstone.  Right upper quadrant ultrasound on 01/18/2022 showed tiny gallstones without evidence of cholecystitis.  Patient was seen by Dr. Dahlia Byes of surgery on 7/24, planning to do surgery, however patient was found to have hyponatremia, therefore surgery has not been scheduled yet.  Patient states that her abdominal pain has worsened the last night. It is located in the right lower quadrant and epigastric area, constant, aching and burning-like pain, 8 out of 10 in severity, radiating to the middle back.  Associated with nausea, but no vomiting, diarrhea or abdominal pain.  No fever or chills.  Patient does not have chest pain, cough, shortness breath.  Denies symptoms of UTI. Of note, pt is started on Diflucan on 8/3 for possible esophagitis by GI."   She had a brief syncopal episode in the ED, found to be bradycardic, but not hypotensive.  Metoprolol held.  HR subsequently stable   Labs notable for hyponatremia with Na 126.  UA was concerning for infection, pt started on empiric antibiotics pending cultures.  CT head negative.     Admitted to the hospital and started on IV fluids. Nephrology consulted after Na fell to 122 after initial fluids were given.  General surgery consulted given plan for cholecystectomy.  The patient underwent a robotic assisted laparoscopic cholecystectomy on 02/14/2022 with Dr. Dahlia Byes. She has  tolerated the procedure well.  This morning the patient's sodium dropped to 125 from 133 pre-operatively. Nephrology has started Tolvapta.  Consultants  Nephrology General Surgery  Procedures  robotic assisted laparoscopic cholecystectomy   Antibiotics   Anti-infectives (From admission, onward)    Start     Dose/Rate Route Frequency Ordered Stop   02/12/22 1000  fluconazole (DIFLUCAN) tablet 100 mg        100 mg Oral Daily 02/12/22 0816     02/12/22 0830  cefTRIAXone (ROCEPHIN) 1 g in sodium chloride 0.9 % 100 mL IVPB        1 g 200 mL/hr over 30 Minutes Intravenous Every 24 hours 02/12/22 0818        Subjective  The patient states that she is feeling well. She is a little confused.  Objective   Vitals:  Vitals:   02/15/22 0848 02/15/22 1656  BP: (!) 155/81 (!) 144/74  Pulse: (!) 103 85  Resp: 18 16  Temp: 99 F (37.2 C) 98.4 F (36.9 C)  SpO2: 97% 99%    Exam:  Constitutional:  The patient is awake, alert, but a little confused. No acute distress. Respiratory:  No increased work of breathing. No wheezes, rales, or rhonchi No tactile fremitus Cardiovascular:  Regular rate and rhythm No murmurs, ectopy, or gallups. No lateral PMI. No thrills. Abdomen:  Abdomen is soft, non-tender, non-distended No hernias, masses, or organomegaly Hypoactive bowel sounds.  Musculoskeletal:  No cyanosis, clubbing, or edema Skin:  No rashes, lesions, ulcers palpation of skin: no induration or nodules Neurologic:  CN 2-12 intact Sensation all 4 extremities intact Psychiatric:  Mental status Mood, affect appropriate Orientation to person, place, time  judgment and insight appear intact   I have personally reviewed the following:   Today's Data  Vitals  Lab Data  CBC BMP  Micro Data  Urine culture 02/12/2022 has had poly microbial growth.   Imaging  CT chest abdomen pelvis CT head  Cardiology Data  EKG Echocardiogram  Other Data    Scheduled  Meds:  acetaminophen  1,000 mg Oral Q6H   enoxaparin (LOVENOX) injection  40 mg Subcutaneous Q24H   fluconazole  100 mg Oral Daily   pantoprazole  40 mg Oral Daily   sodium chloride  1 g Oral BID WC   sucralfate  1 g Oral QID   Continuous Infusions:  cefTRIAXone (ROCEPHIN)  IV Stopped (02/15/22 1816)    Principal Problem:   Hyponatremia Active Problems:   Syncope   Cholelithiasis   Abdominal pain   History of CVA (cerebrovascular accident)   Hypertension   HLD (hyperlipidemia)   UTI (urinary tract infection)   Hypothyroidism, adult   Esophagitis   Hypokalemia   LOS: 3 days   A & P  Assessment and Plan: * Hyponatremia Na 126 on admission.  Hypotonic.  On exam pt appears mildly hypovolemic.  Suspect due to poor oral intake and dehydration.  Pt reports drinking about three 12 oz glasses of water daily, sometimes less.  Other differential diagnosis includes SIADH.  TSH normal, free T4 mildly elevated. After initial fluids given, Na repeat was 122. Nephrology was consulted and recommended fluid restriction and salt tablets, suspecting SIADH.   The patient was initially continued on IV fluids with fluid restriction and salt tablets given. Her sodium improved to 133. The fluids were then stopped on 02/13/2022. Na dropped to 130 on the morning of 02/14/2022. Then they dropped to 125 on the morning of 02/15/2022. Nephrology will start tolvaptan.    Syncope Etiology is not clear, suspect bradycardic episode or vasovagal syncope, orthostatic, TIA/stroke, cardiac arrhythmia, ACS (less likely, given no chest pain and normal trop) MRI brain negative. Likely related to cholecystitis.  Echo with normal EF, mild MR. -- Orthostatic vital signs  -- Neuro checks  --PT/OT --Telemetry  Cholelithiasis RUQ ultrasound on 01/18/2022 showed tiny gallstone, without evidence of cholecystitis.  On admission, no fever or leukocytosis.  Lipase normal 35, liver function normal. --General surgery  following --Robotic assisted cholecystectomy on 02/14/2022.  --Supportive care - antiemetics, pain control PRN    Abdominal pain Likely due to gallstone/cholecystitis.   CTA negative for dissection. --General surgery following --S/P Robotic assisted cholecystectomy on 02/14/2022.    History of CVA (cerebrovascular accident) Stroke is listed in medical problem list, but the patient is not aware of having prior stroke.   Not taking aspirin or pravastatin currently. MRI brain no acute findings, mild small vessel ischemic diesase. --Hold off aspirin due to possible esophagitis    Hypertension Patient reports not taking Cozaar. BP's mildly elevated --IV hydralazine as needed for now  HLD (hyperlipidemia) Patient is not taking pravastatin -Follow-up with PCP  UTI (urinary tract infection) Empiric IV Rocephin Urine culture has had polymicrobial growth.  Hypothyroidism, adult Patient is not taking medications currently TSH normal. Free T4 mildly elevated. PCP follow up.  Hypokalemia K 3.4 today, replace with 40 mEq oral KCl. Monitor BMP, replace K PRN.  Esophagitis - Continue Diflucan -Continue Protonix and Carafate   I have seen and examined this patient myself. I have spent 34 minutes in her evaluation and care.  DVT prophylaxis: Lovenox Code Status: Partial Code Family Communication: Family at bedside Disposition Plan: home    [name] Cratty, DO [facility] Direct contact: see [email or web address]  7PM-7AM contact night coverage as above 02/15/2022, 6:33 PM  LOS: 3 days

## 2022-02-15 NOTE — Progress Notes (Addendum)
[facility]  ROUNDING NOTE   Subjective:   Patient seen sitting up in bed Alert and oriented to self and place Reports poor appetite however improving Remains on room air No lower extremity edema  Objective:  Vital signs in last 24 hours:  Temp:  [97 F (36.1 C)-99 F (37.2 C)] 99 F (37.2 C) (08/16 0848) Pulse Rate:  [93-119] 103 (08/16 0848) Resp:  [9-22] 18 (08/16 0848) BP: (130-168)/(72-91) 155/81 (08/16 0848) SpO2:  [96 %-100 %] 97 % (08/16 0848)  Weight change:  Filed Weights   02/12/22 0427  Weight: 49 kg    Intake/Output: I/O last 3 completed shifts: In: 500 [I.V.:500] Out: 400 [Urine:400]   Intake/Output this shift:  Total I/O In: 60 [P.O.:60] Out: -   Physical Exam: General: NAD  Head: Normocephalic, atraumatic. Moist oral mucosal membranes  Eyes: Anicteric  Lungs:  Clear to auscultation, normal effort, room air  Heart: Regular rate and rhythm  Abdomen:  Soft, tender, nondistended, surgical lap sites  Extremities: No peripheral edema.  Neurologic: Nonfocal, moving all four extremities  Skin: No lesions  Access: None    Basic Metabolic Panel: Recent Labs  Lab 02/12/22 1453 02/12/22 2002 02/13/22 1456 02/13/22 1944 02/14/22 0747 02/14/22 1957 02/15/22 0529  NA 125*   < > 134* 130* 133* 129* 125*  K 4.0   < > 3.3* 3.6 3.4* 3.9 4.0  CL 92*   < > 103 99 101 96* 91*  CO2 21*   < > '25 24 25 22 25  '$ GLUCOSE 107*   < > 129* 157* 109* 230* 164*  BUN 6*   < > 10 8 6* 7* 6*  CREATININE 0.49   < > 0.54 0.58 0.56 0.65 0.48  CALCIUM 8.7*   < > 8.4* 8.7* 8.9 8.8* 9.4  MG 1.7  --   --   --   --   --  1.7   < > = values in this interval not displayed.     Liver Function Tests: Recent Labs  Lab 02/12/22 0432 02/12/22 1453  AST 26 20  ALT 19 16  ALKPHOS 57 50  BILITOT 0.8 0.6  PROT 7.0 6.4*  ALBUMIN 4.1 3.7    Recent Labs  Lab 02/12/22 0432  LIPASE 35    No results for input(s): "AMMONIA" in the last 168  hours.  CBC: Recent Labs  Lab 02/12/22 0432 02/15/22 0529  WBC 10.2 12.0*  HGB 12.6 12.3  HCT 37.0 34.4*  MCV 86.7 84.7  PLT 407* 397     Cardiac Enzymes: No results for input(s): "CKTOTAL", "CKMB", "CKMBINDEX", "TROPONINI" in the last 168 hours.  BNP: Invalid input(s): "POCBNP"  CBG: Recent Labs  Lab 02/12/22 0445  GLUCAP 111*     Microbiology: Results for orders placed or performed during the hospital encounter of 02/12/22  Urine Culture     Status: Abnormal   Collection Time: 02/12/22  6:22 AM   Specimen: Urine, Clean Catch  Result Value Ref Range Status   Specimen Description   Final    URINE, CLEAN CATCH Performed at [facility], [address] [place], [place] [address]    Special Requests   Final    NONE Performed at [facility], [address] [place], [place] [address]    Culture MULTIPLE SPECIES PRESENT, SUGGEST RECOLLECTION (A)  Final   Report Status 02/13/2022 FINAL  Final    Coagulation Studies: No results for input(s): "LABPROT", "INR" in the  last 72 hours.   Urinalysis: No results for input(s): "COLORURINE", "LABSPEC", "PHURINE", "GLUCOSEU", "HGBUR", "BILIRUBINUR", "KETONESUR", "PROTEINUR", "UROBILINOGEN", "NITRITE", "LEUKOCYTESUR" in the last 72 hours.  Invalid input(s): "APPERANCEUR"     Imaging: US Abdomen Limited RUQ (LIVER/GB)  Result Date: 02/13/2022 CLINICAL DATA:  Pain right upper quadrant EXAM: ULTRASOUND ABDOMEN LIMITED RIGHT UPPER QUADRANT COMPARISON:  01/18/2022 FINDINGS: Gallbladder: There are no definite demonstrable gallbladder stones. Small echogenic foci in the dependent portion of gallbladder lumen may suggest minimal sludge in the lumen. Technologist did not observe any tenderness over the gallbladder. There is no fluid around the gallbladder. Common bile duct: Diameter: 4.1 mm Liver: No focal lesion identified. Within normal limits in parenchymal echogenicity. Portal vein is patent on color Doppler  imaging with normal direction of blood flow towards the liver. Other: None. IMPRESSION: There are no definite demonstrable gallbladder stones. There is possible minimal amount of sludge in gallbladder. There are no sonographic signs of acute cholecystitis. There is no dilation of bile ducts. Electronically Signed   By: Elmer Picker M.D.   On: 02/13/2022 16:14     Medications:    cefTRIAXone (ROCEPHIN)  IV 1 g (02/15/22 0934)    acetaminophen  1,000 mg Oral Q6H   enoxaparin (LOVENOX) injection  40 mg Subcutaneous Q24H   fluconazole  100 mg Oral Daily   pantoprazole  40 mg Oral Daily   sodium chloride  1 g Oral BID WC   sucralfate  1 g Oral QID   tolvaptan  15 mg Oral Once   alum & mag hydroxide-simeth, hydrALAZINE, morphine injection, ondansetron (ZOFRAN) IV, oxyCODONE, senna  Assessment/ Plan:  [name] is a 81 y.o.  female with a PMHx of hypertension, coronary artery disease, hyperlipidemia, history of CVA, history of breast cancer s/p radiation and hysterectomy, diverticulosis and postherpetic neuralgia who is now admitted with history of abdominal pain and a syncopal episode.  Patient has history of cholelithiasis and was supposed to go for cholecystectomy.  Surgery was postponed secondary to her hyponatremia.  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  Sodium on admission 126 with lowest point of 122.   Sodium initially corrected to 133 with IV fluids and salt tablets.  However steady decline resulted in sodium 125 today.  Patient reports mild nausea and has undergone surgery.  Will prescribe tolvaptan 15 mg once.  We will temporarily remove fluid restriction.  We will continue to monitor sodium levels.  2.  Chronic cholecystitis.  Underwent laparoscopic cholecystectomy on 02/14/2022.  Surgery following.    LOS: 3 [name] [name] 8/16/202312:58 PM

## 2022-02-16 DIAGNOSIS — E871 Hypo-osmolality and hyponatremia: Secondary | ICD-10-CM | POA: Diagnosis not present

## 2022-02-16 LAB — BASIC METABOLIC PANEL
Anion gap: 9 (ref 5–15)
BUN: 12 mg/dL (ref 8–23)
CO2: 23 mmol/L (ref 22–32)
Calcium: 9.2 mg/dL (ref 8.9–10.3)
Chloride: 97 mmol/L — ABNORMAL LOW (ref 98–111)
Creatinine, Ser: 0.59 mg/dL (ref 0.44–1.00)
GFR, Estimated: >60 mL/min (ref >60)
Glucose, Bld: 151 mg/dL — ABNORMAL HIGH (ref 70–99)
Potassium: 3.9 mmol/L (ref 3.5–5.1)
Sodium: 129 mmol/L — ABNORMAL LOW (ref 135–145)

## 2022-02-16 LAB — SURGICAL PATHOLOGY

## 2022-02-16 LAB — SODIUM: Sodium: 129 mmol/L — ABNORMAL LOW (ref 135–145)

## 2022-02-16 NOTE — Plan of Care (Signed)

## 2022-02-16 NOTE — Progress Notes (Signed)
PROGRESS NOTE  [name] [name] MVE:[id number] DOB: 19-Jan-1941 DOA: 02/12/2022 PCP: Idelle Crouch, MD  Brief History   HPI on admission: "[name] [name] is a 81 y.o. female with medical history significant of HTN, HLD, stroke (patient is not aware of this diagnosis),hypothyroidism, left breast cancer (s/p for lumpectomy and radiation therapy), skin cancer, diverticulosis, post herpetic neuralgia, gallstone, possible esophagitis, who presents with syncope and abdominal pain.   Patient states that she has right upper quadrant abdominal pain for several months.  She was diagnosed as gallstone.  Right upper quadrant ultrasound on 01/18/2022 showed tiny gallstones without evidence of cholecystitis.  Patient was seen by Dr. Dahlia Byes of surgery on 7/24, planning to do surgery, however patient was found to have hyponatremia, therefore surgery has not been scheduled yet.  Patient states that her abdominal pain has worsened the last night. It is located in the right lower quadrant and epigastric area, constant, aching and burning-like pain, 8 out of 10 in severity, radiating to the middle back.  Associated with nausea, but no vomiting, diarrhea or abdominal pain.  No fever or chills.  Patient does not have chest pain, cough, shortness breath.  Denies symptoms of UTI. Of note, pt is started on Diflucan on 8/3 for possible esophagitis by GI."   She had a brief syncopal episode in the ED, found to be bradycardic, but not hypotensive.  Metoprolol held.  HR subsequently stable   Labs notable for hyponatremia with Na 126.  UA was concerning for infection, pt started on empiric antibiotics pending cultures.  CT head negative.     Admitted to the hospital and started on IV fluids. Nephrology consulted after Na fell to 122 after initial fluids were given.  General surgery consulted given plan for cholecystectomy.  The patient underwent a robotic assisted laparoscopic cholecystectomy on 02/14/2022 with Dr. Dahlia Byes. She has  tolerated the procedure well.  This morning the patient's sodium dropped to 125 from 133 pre-operatively. Nephrology has started Tolvapta. Continue to monitor sodium  Consultants  Nephrology General Surgery  Procedures  robotic assisted laparoscopic cholecystectomy   Antibiotics   Anti-infectives (From admission, onward)    Start     Dose/Rate Route Frequency Ordered Stop   02/12/22 1000  fluconazole (DIFLUCAN) tablet 100 mg        100 mg Oral Daily 02/12/22 0816     02/12/22 0830  cefTRIAXone (ROCEPHIN) 1 g in sodium chloride 0.9 % 100 mL IVPB  Status:  Discontinued        1 g 200 mL/hr over 30 Minutes Intravenous Every 24 hours 02/12/22 0818 02/16/22 1449      Subjective  The patient states that she is feeling well. She is a little confused.  Objective   Vitals:  Vitals:   02/16/22 0420 02/16/22 0734  BP: (!) 159/93 122/67  Pulse: 83 93  Resp: 16 14  Temp: 97.7 F (36.5 C) 98.2 F (36.8 C)  SpO2: 99% 98%    Exam:  Constitutional:  The patient is awake, alert, but a little confused. No acute distress. Respiratory:  No increased work of breathing. No wheezes, rales, or rhonchi No tactile fremitus Cardiovascular:  Regular rate and rhythm No murmurs, ectopy, or gallups. No lateral PMI. No thrills. Abdomen:  Abdomen is soft, non-tender, non-distended No hernias, masses, or organomegaly Hypoactive bowel sounds.  Musculoskeletal:  No cyanosis, clubbing, or edema Skin:  No rashes, lesions, ulcers palpation of skin: no induration or nodules Neurologic:  CN 2-12 intact Sensation  all 4 extremities intact Psychiatric:  Mental status Mood, affect appropriate Orientation to person, place, time  judgment and insight appear intact   I have personally reviewed the following:   Today's Data  Vitals  Lab Data  CBC BMP  Micro Data  Urine culture 02/12/2022 has had poly microbial growth.   Imaging  CT chest abdomen pelvis CT head  Cardiology Data   EKG Echocardiogram  Other Data    Scheduled Meds:  acetaminophen  1,000 mg Oral Q6H   enoxaparin (LOVENOX) injection  40 mg Subcutaneous Q24H   fluconazole  100 mg Oral Daily   pantoprazole  40 mg Oral Daily   sodium chloride  1 g Oral BID WC   sucralfate  1 g Oral QID   Continuous Infusions:    Principal Problem:   Hyponatremia Active Problems:   Syncope   Cholelithiasis   Abdominal pain   History of CVA (cerebrovascular accident)   Hypertension   HLD (hyperlipidemia)   UTI (urinary tract infection)   Hypothyroidism, adult   Esophagitis   Hypokalemia   LOS: 4 days   A & P  Assessment and Plan: * Hyponatremia Na 126 on admission.  Hypotonic.  On exam pt appears mildly hypovolemic.  Suspect due to poor oral intake and dehydration.  Pt reports drinking about three 12 oz glasses of water daily, sometimes less.  Other differential diagnosis includes SIADH.  TSH normal, free T4 mildly elevated. After initial fluids given, Na repeat was 122. Nephrology was consulted and recommended fluid restriction and salt tablets, suspecting SIADH.   The patient was initially continued on IV fluids with fluid restriction and salt tablets given. Her sodium improved to 133. The fluids were then stopped on 02/13/2022. Na dropped to 130 on the morning of 02/14/2022. Then they dropped to 125 on the morning of 02/15/2022. Nephrology gave one dose of tolvaptan. Na is 129 today.    Syncope Etiology is not clear, suspect bradycardic episode or vasovagal syncope, orthostatic, TIA/stroke, cardiac arrhythmia, ACS (less likely, given no chest pain and normal trop) MRI brain negative. Likely related to cholecystitis.  Echo with normal EF, mild MR. -- Orthostatic vital signs  -- Neuro checks  --PT/OT --Telemetry  Cholelithiasis RUQ ultrasound on 01/18/2022 showed tiny gallstone, without evidence of cholecystitis.  On admission, no fever or leukocytosis.  Lipase normal 35, liver function  normal. --General surgery following --Robotic assisted cholecystectomy on 02/14/2022.  --Supportive care - antiemetics, pain control PRN    Abdominal pain Likely due to gallstone/cholecystitis.   CTA negative for dissection. --General surgery following --S/P Robotic assisted cholecystectomy on 02/14/2022.    History of CVA (cerebrovascular accident) Stroke is listed in medical problem list, but the patient is not aware of having prior stroke.   Not taking aspirin or pravastatin currently. MRI brain no acute findings, mild small vessel ischemic diesase. --Hold off aspirin due to possible esophagitis    Hypertension Patient reports not taking Cozaar. BP's mildly elevated --IV hydralazine as needed for now  HLD (hyperlipidemia) Patient is not taking pravastatin -Follow-up with PCP  UTI (urinary tract infection) Empiric IV Rocephin Urine culture has had polymicrobial growth.  Hypothyroidism, adult Patient is not taking medications currently TSH normal. Free T4 mildly elevated. PCP follow up.  Hypokalemia K 3.4 today, replace with 40 mEq oral KCl. Monitor BMP, replace K PRN.  Esophagitis - Continue Diflucan -Continue Protonix and Carafate   I have seen and examined this patient myself. I have spent 34 minutes in  her evaluation and care.  DVT prophylaxis: Lovenox Code Status: Partial Code Family Communication: Family at bedside Disposition Plan: home    Mlissa Tamayo, DO [facility] Direct contact: see [email or web address]  7PM-7AM contact night coverage as above 02/16/2022, 5:33 PM  LOS: 3 days

## 2022-02-16 NOTE — Progress Notes (Signed)
Physical Therapy Treatment Patient Details Name: [name] [name] MRN: [id number] DOB: [birth year] Today's Date: 02/16/2022   History of Present Illness Pt is 32 YOF admitted for syncope, hyponatremia, UTI, gallstones, and abdominal pain. PMH includes: HTN, HLD, CVA, hypothyroidism, breast CA, skin CA, diverticulosis, post herpetic neualgia, and esophagitis.    PT Comments    Pt less lethargic and more cognitively intact this date. Improved safety awareness, able to perform transfers with CGA, pt tolerated 19f of gait training with support of RW and CGA.  Improved ability to navigate around objects with RW. Will continue PT per POC. Continue to recommend HHPT once medically cleared for d/c.  No equipment needs at this time.   Recommendations for follow up therapy are one component of a multi-disciplinary discharge planning process, led by the attending physician.  Recommendations may be updated based on patient status, additional functional criteria and insurance authorization.  Follow Up Recommendations  Home health PT     Assistance Recommended at Discharge Frequent or constant Supervision/Assistance  Patient can return home with the following Assistance with cooking/housework;Assist for transportation;Help with stairs or ramp for entrance;A lot of help with walking and/or transfers;A lot of help with bathing/dressing/bathroom;Direct supervision/assist for medications management;Direct supervision/assist for financial management   Equipment Recommendations  Rolling walker (2 wheels)    Recommendations for Other Services       Precautions / Restrictions Precautions Precautions: Fall Restrictions Weight Bearing Restrictions: No     Mobility  Bed Mobility               General bed mobility comments: Pt received and returned to recliner    Transfers Overall transfer level: Needs assistance Equipment used: Rolling walker (2 wheels) Transfers: Sit to/from Stand Sit to  Stand: Min guard           General transfer comment: Less assistance needed for transfers today    Ambulation/Gait Ambulation/Gait assistance: Min guard Gait Distance (Feet): 85 Feet Assistive device: Rolling walker (2 wheels) Gait Pattern/deviations: Step-through pattern, Decreased step length - right, Decreased step length - left Gait velocity: decr     General Gait Details: Improved ability to navigate RW around oPress photographer   Modified Rankin (Stroke Patients Only)       Balance Overall balance assessment: Needs assistance Sitting-balance support: Feet supported Sitting balance-Leahy Scale: Good Sitting balance - Comments: able to perform hygiene while sitting on toilet without LOB   Standing balance support: Bilateral upper extremity supported, During functional activity, Reliant on assistive device for balance Standing balance-Leahy Scale: Fair Standing balance comment: No LOB with gait                            Cognition Arousal/Alertness: Awake/alert Behavior During Therapy: WFL for tasks assessed/performed Overall Cognitive Status: Within Functional Limits for tasks assessed                         Following Commands: Follows one step commands consistently       General Comments:  (Cognition appears to be clearing.)        Exercises General Exercises - Lower Extremity Ankle Circles/Pumps: AROM, Both, 15 reps Long Arc Quad: AROM, Both, 10 reps Hip Flexion/Marching: AROM, Both, 10 reps, Seated    General Comments        Pertinent Vitals/Pain Pain  Assessment Pain Assessment: No/denies pain    Home Living                          Prior Function            PT Goals (current goals can now be found in the care plan section) Acute Rehab PT Goals Patient Stated Goal: to get back to cooking Progress towards PT goals: Progressing toward goals    Frequency     Min 2X/week      PT Plan Current plan remains appropriate    Co-evaluation              AM-PAC PT "6 Clicks" Mobility   Outcome Measure  Help needed turning from your back to your side while in a flat bed without using bedrails?: A Little Help needed moving from lying on your back to sitting on the side of a flat bed without using bedrails?: A Little Help needed moving to and from a bed to a chair (including a wheelchair)?: A Little Help needed standing up from a chair using your arms (e.g., wheelchair or bedside chair)?: A Little Help needed to walk in hospital room?: A Little Help needed climbing 3-5 steps with a railing? : A Lot 6 Click Score: 17    End of Session Equipment Utilized During Treatment: Gait belt Activity Tolerance: Patient tolerated treatment well Patient left: in chair;with call bell/phone within reach;with family/visitor present Nurse Communication: Mobility status PT Visit Diagnosis: Other abnormalities of gait and mobility (R26.89);Muscle weakness (generalized) (M62.81)     Time: 3474-2595 PT Time Calculation (min) (ACUTE ONLY): 33 min  Charges:  $Gait Training: 8-22 mins $Therapeutic Exercise: 8-22 mins                    Mikel Cella, PTA    Josie Dixon 02/16/2022, 2:53 PM

## 2022-02-16 NOTE — Progress Notes (Signed)
[facility]  ROUNDING NOTE   Subjective:   Patient seen sitting in chair, daughter  alert and oriented to self and place Tolerating small meals Complains of hand tremors today  Sodium 129  Objective:  Vital signs in last 24 hours:  Temp:  [97.7 F (36.5 C)-98.5 F (36.9 C)] 98.2 F (36.8 C) (08/17 0734) Pulse Rate:  [79-93] 93 (08/17 0734) Resp:  [14-16] 14 (08/17 0734) BP: (122-159)/(67-98) 122/67 (08/17 0734) SpO2:  [97 %-99 %] 98 % (08/17 0734)  Weight change:  Filed Weights   02/12/22 0427  Weight: 49 kg    Intake/Output: I/O last 3 completed shifts: In: 61 [P.O.:60] Out: 600 [Urine:600]   Intake/Output this shift:  Total I/O In: 240 [P.O.:240] Out: -   Physical Exam: General: NAD  Head: Normocephalic, atraumatic. Moist oral mucosal membranes  Eyes: Anicteric  Lungs:  Clear to auscultation, normal effort, room air  Heart: Regular rate and rhythm  Abdomen:  Soft, tender, nondistended, surgical lap sites  Extremities: No peripheral edema.  Neurologic: Nonfocal, moving all four extremities, bilateral upper extremity tremors  Skin: No lesions  Access: None    Basic Metabolic Panel: Recent Labs  Lab 02/12/22 1453 02/12/22 2002 02/13/22 1456 02/13/22 1944 02/14/22 0747 02/14/22 1957 02/15/22 0529 02/15/22 2128 02/16/22 0453  NA 125*   < > 134* 130* 133* 129* 125* 127* 129*  K 4.0   < > 3.3* 3.6 3.4* 3.9 4.0  --   --   CL 92*   < > 103 99 101 96* 91*  --   --   CO2 21*   < > '25 24 25 22 25  '$ --   --   GLUCOSE 107*   < > 129* 157* 109* 230* 164*  --   --   BUN 6*   < > 10 8 6* 7* 6*  --   --   CREATININE 0.49   < > 0.54 0.58 0.56 0.65 0.48  --   --   CALCIUM 8.7*   < > 8.4* 8.7* 8.9 8.8* 9.4  --   --   MG 1.7  --   --   --   --   --  1.7  --   --    < > = values in this interval not displayed.     Liver Function Tests: Recent Labs  Lab 02/12/22 0432 02/12/22 1453 02/15/22 0529  AST 26 20 50*  ALT 19 16 37  ALKPHOS 57 50 49   BILITOT 0.8 0.6 0.5  PROT 7.0 6.4* 6.8  ALBUMIN 4.1 3.7 4.1    Recent Labs  Lab 02/12/22 0432  LIPASE 35    No results for input(s): "AMMONIA" in the last 168 hours.  CBC: Recent Labs  Lab 02/12/22 0432 02/15/22 0529  WBC 10.2 12.0*  HGB 12.6 12.3  HCT 37.0 34.4*  MCV 86.7 84.7  PLT 407* 397     Cardiac Enzymes: No results for input(s): "CKTOTAL", "CKMB", "CKMBINDEX", "TROPONINI" in the last 168 hours.  BNP: Invalid input(s): "POCBNP"  CBG: Recent Labs  Lab 02/12/22 0445  GLUCAP 111*     Microbiology: Results for orders placed or performed during the hospital encounter of 02/12/22  Urine Culture     Status: Abnormal   Collection Time: 02/12/22  6:22 AM   Specimen: Urine, Clean Catch  Result Value Ref Range Status   Specimen Description   Final    URINE, CLEAN CATCH Performed at [facility]  Lab, [address], [place], [place] [address]    Special Requests   Final    NONE Performed at [facility], [address] [place], [place] [address]    Culture MULTIPLE SPECIES PRESENT, SUGGEST RECOLLECTION (A)  Final   Report Status 02/13/2022 FINAL  Final    Coagulation Studies: No results for input(s): "LABPROT", "INR" in the last 72 hours.   Urinalysis: No results for input(s): "COLORURINE", "LABSPEC", "PHURINE", "GLUCOSEU", "HGBUR", "BILIRUBINUR", "KETONESUR", "PROTEINUR", "UROBILINOGEN", "NITRITE", "LEUKOCYTESUR" in the last 72 hours.  Invalid input(s): "APPERANCEUR"     Imaging: No results found.   Medications:    cefTRIAXone (ROCEPHIN)  IV 1 g (02/16/22 0921)    acetaminophen  1,000 mg Oral Q6H   enoxaparin (LOVENOX) injection  40 mg Subcutaneous Q24H   fluconazole  100 mg Oral Daily   pantoprazole  40 mg Oral Daily   sodium chloride  1 g Oral BID WC   sucralfate  1 g Oral QID   alum & mag hydroxide-simeth, hydrALAZINE, morphine injection, ondansetron (ZOFRAN) IV, oxyCODONE, senna  Assessment/ Plan:  [name] is a 81 y.o.  female with a PMHx of hypertension, coronary artery disease, hyperlipidemia, history of CVA, history of breast cancer s/p radiation and hysterectomy, diverticulosis and postherpetic neuralgia who is now admitted with history of abdominal pain and a syncopal episode.  Patient has history of cholelithiasis and was supposed to go for cholecystectomy.  Surgery was postponed secondary to her hyponatremia.  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  Sodium on admission 126 with lowest point of 122.   Sodium initially corrected to 133 with IV fluids and salt tablets.  However steady decline resulted in sodium 125 today.  Patient given 1 dose tolvaptan 15 mg yesterday.  Sodium increased to 129 today.  Patient encouraged to limit fluid intake.  We will continue to monitor and manage with sodium tabs and fluid restriction.  2.  Chronic cholecystitis.  Underwent laparoscopic cholecystectomy on 02/14/2022.  Surgery following.    LOS: 4 Darsha Zumstein 8/17/202312:56 PM

## 2022-02-17 ENCOUNTER — Inpatient Hospital Stay: Payer: No Typology Code available for payment source

## 2022-02-17 ENCOUNTER — Other Ambulatory Visit: Payer: Self-pay

## 2022-02-17 ENCOUNTER — Inpatient Hospital Stay: Payer: No Typology Code available for payment source | Admitting: Certified Registered Nurse Anesthetist

## 2022-02-17 ENCOUNTER — Encounter: Payer: Self-pay | Admitting: Internal Medicine

## 2022-02-17 ENCOUNTER — Encounter
Admission: EM | Disposition: A | Discharge status: Home Health | Payer: Self-pay | Source: Home / Self Care | Attending: Internal Medicine

## 2022-02-17 DIAGNOSIS — K430 Incisional hernia with obstruction, without gangrene: Secondary | ICD-10-CM

## 2022-02-17 DIAGNOSIS — K439 Ventral hernia without obstruction or gangrene: Secondary | ICD-10-CM | POA: Diagnosis not present

## 2022-02-17 DIAGNOSIS — K432 Incisional hernia without obstruction or gangrene: Secondary | ICD-10-CM | POA: Diagnosis not present

## 2022-02-17 DIAGNOSIS — I10 Essential (primary) hypertension: Secondary | ICD-10-CM | POA: Diagnosis not present

## 2022-02-17 DIAGNOSIS — E039 Hypothyroidism, unspecified: Secondary | ICD-10-CM | POA: Diagnosis not present

## 2022-02-17 DIAGNOSIS — K219 Gastro-esophageal reflux disease without esophagitis: Secondary | ICD-10-CM | POA: Diagnosis not present

## 2022-02-17 DIAGNOSIS — I700 Atherosclerosis of aorta: Secondary | ICD-10-CM | POA: Diagnosis not present

## 2022-02-17 DIAGNOSIS — R109 Unspecified abdominal pain: Secondary | ICD-10-CM | POA: Diagnosis not present

## 2022-02-17 DIAGNOSIS — E871 Hypo-osmolality and hyponatremia: Secondary | ICD-10-CM | POA: Diagnosis not present

## 2022-02-17 HISTORY — PX: VENTRAL HERNIA REPAIR: SHX424

## 2022-02-17 LAB — CBC WITH DIFFERENTIAL/PLATELET
Abs Immature Granulocytes: 0.1 10*3/uL — ABNORMAL HIGH (ref 0.00–0.07)
Basophils Absolute: 0 10*3/uL (ref 0.0–0.1)
Basophils Relative: 0 %
Eosinophils Absolute: 0 10*3/uL (ref 0.0–0.5)
Eosinophils Relative: 0 %
HCT: 34.7 % — ABNORMAL LOW (ref 36.0–46.0)
Hemoglobin: 11.9 g/dL — ABNORMAL LOW (ref 12.0–15.0)
Immature Granulocytes: 1 %
Lymphocytes Relative: 12 %
Lymphs Abs: 1.3 10*3/uL (ref 0.7–4.0)
MCH: 29.7 pg (ref 26.0–34.0)
MCHC: 34.3 g/dL (ref 30.0–36.0)
MCV: 86.5 fL (ref 80.0–100.0)
Monocytes Absolute: 0.6 10*3/uL (ref 0.1–1.0)
Monocytes Relative: 5 %
Neutro Abs: 9.3 10*3/uL — ABNORMAL HIGH (ref 1.7–7.7)
Neutrophils Relative %: 82 %
Platelets: 328 10*3/uL (ref 150–400)
RBC: 4.01 MIL/uL (ref 3.87–5.11)
RDW: 13.9 % (ref 11.5–15.5)
WBC: 11.4 10*3/uL — ABNORMAL HIGH (ref 4.0–10.5)
nRBC: 0 % (ref 0.0–0.2)

## 2022-02-17 LAB — COMPREHENSIVE METABOLIC PANEL
ALT: 29 U/L (ref 0–44)
AST: 31 U/L (ref 15–41)
Albumin: 3.5 g/dL (ref 3.5–5.0)
Alkaline Phosphatase: 44 U/L (ref 38–126)
Anion gap: 7 (ref 5–15)
BUN: 19 mg/dL (ref 8–23)
CO2: 23 mmol/L (ref 22–32)
Calcium: 9 mg/dL (ref 8.9–10.3)
Chloride: 99 mmol/L (ref 98–111)
Creatinine, Ser: 0.54 mg/dL (ref 0.44–1.00)
GFR, Estimated: >60 mL/min (ref >60)
Glucose, Bld: 117 mg/dL — ABNORMAL HIGH (ref 70–99)
Potassium: 3.5 mmol/L (ref 3.5–5.1)
Sodium: 129 mmol/L — ABNORMAL LOW (ref 135–145)
Total Bilirubin: 0.5 mg/dL (ref 0.3–1.2)
Total Protein: 6.2 g/dL — ABNORMAL LOW (ref 6.5–8.1)

## 2022-02-17 LAB — CBC
HCT: 36.6 % (ref 36.0–46.0)
Hemoglobin: 12.6 g/dL (ref 12.0–15.0)
MCH: 29.6 pg (ref 26.0–34.0)
MCHC: 34.4 g/dL (ref 30.0–36.0)
MCV: 85.9 fL (ref 80.0–100.0)
Platelets: 355 10*3/uL (ref 150–400)
RBC: 4.26 MIL/uL (ref 3.87–5.11)
RDW: 13.9 % (ref 11.5–15.5)
WBC: 13.2 10*3/uL — ABNORMAL HIGH (ref 4.0–10.5)
nRBC: 0 % (ref 0.0–0.2)

## 2022-02-17 LAB — PROTIME-INR
INR: 1 (ref 0.8–1.2)
Prothrombin Time: 13.5 s (ref 11.4–15.2)

## 2022-02-17 LAB — MAGNESIUM: Magnesium: 1.9 mg/dL (ref 1.7–2.4)

## 2022-02-17 LAB — APTT: aPTT: 33 seconds (ref 24–36)

## 2022-02-17 SURGERY — REPAIR, HERNIA, VENTRAL, LAPAROSCOPIC
Anesthesia: General | Laterality: Right

## 2022-02-17 MED ORDER — LIDOCAINE HCL (CARDIAC) PF 100 MG/5ML IV SOSY
PREFILLED_SYRINGE | INTRAVENOUS | Status: DC | PRN
  Administered 2022-02-17: 50 mg via INTRAVENOUS

## 2022-02-17 MED ORDER — DEXAMETHASONE SODIUM PHOSPHATE 10 MG/ML IJ SOLN
INTRAMUSCULAR | Status: AC
  Filled 2022-02-17: qty 1

## 2022-02-17 MED ORDER — GLYCOPYRROLATE 0.2 MG/ML IJ SOLN
INTRAMUSCULAR | Status: AC
  Filled 2022-02-17: qty 1

## 2022-02-17 MED ORDER — ONDANSETRON HCL 4 MG/2ML IJ SOLN
4.0000 mg | Freq: Once | INTRAMUSCULAR | Status: AC
Start: 2022-02-17 — End: 2022-02-17
  Administered 2022-02-17: 4 mg via INTRAVENOUS
  Filled 2022-02-17: qty 2

## 2022-02-17 MED ORDER — FENTANYL CITRATE (PF) 100 MCG/2ML IJ SOLN
INTRAMUSCULAR | Status: AC
  Filled 2022-02-17: qty 2

## 2022-02-17 MED ORDER — BUPIVACAINE-EPINEPHRINE (PF) 0.5% -1:200000 IJ SOLN
INTRAMUSCULAR | Status: AC
  Filled 2022-02-17: qty 30

## 2022-02-17 MED ORDER — PROPOFOL 10 MG/ML IV BOLUS
INTRAVENOUS | Status: AC
  Filled 2022-02-17: qty 20

## 2022-02-17 MED ORDER — SODIUM CHLORIDE 0.9 % IV SOLN
2.0000 g | INTRAVENOUS | Status: AC
Start: 2022-02-17 — End: 2022-02-17
  Administered 2022-02-17: 2 g via INTRAVENOUS
  Filled 2022-02-17: qty 2

## 2022-02-17 MED ORDER — LACTATED RINGERS IV SOLN: INTRAVENOUS | Status: DC

## 2022-02-17 MED ORDER — ONDANSETRON HCL 4 MG/2ML IJ SOLN
INTRAMUSCULAR | Status: DC | PRN
  Administered 2022-02-17: 4 mg via INTRAVENOUS

## 2022-02-17 MED ORDER — ONDANSETRON HCL 4 MG/2ML IJ SOLN
INTRAMUSCULAR | Status: AC
  Administered 2022-02-17: 4 mg via INTRAVENOUS
  Filled 2022-02-17: qty 2

## 2022-02-17 MED ORDER — OXYCODONE HCL 5 MG/5ML PO SOLN: 5.0000 mg | Freq: Once | ORAL | Status: DC | PRN

## 2022-02-17 MED ORDER — PROPOFOL 1000 MG/100ML IV EMUL
INTRAVENOUS | Status: AC
  Filled 2022-02-17: qty 100

## 2022-02-17 MED ORDER — PROPOFOL 10 MG/ML IV BOLUS
INTRAVENOUS | Status: DC | PRN
  Administered 2022-02-17: 100 mg via INTRAVENOUS

## 2022-02-17 MED ORDER — SUCCINYLCHOLINE CHLORIDE 200 MG/10ML IV SOSY
PREFILLED_SYRINGE | INTRAVENOUS | Status: DC | PRN
  Administered 2022-02-17: 60 mg via INTRAVENOUS

## 2022-02-17 MED ORDER — ONDANSETRON HCL 4 MG/2ML IJ SOLN
INTRAMUSCULAR | Status: AC
  Filled 2022-02-17: qty 2

## 2022-02-17 MED ORDER — BUPIVACAINE LIPOSOME 1.3 % IJ SUSP
INTRAMUSCULAR | Status: AC
  Filled 2022-02-17: qty 20

## 2022-02-17 MED ORDER — PROPOFOL 500 MG/50ML IV EMUL
INTRAVENOUS | Status: DC | PRN
  Administered 2022-02-17: 100 ug/kg/min via INTRAVENOUS

## 2022-02-17 MED ORDER — CHLORHEXIDINE GLUCONATE 0.12 % MT SOLN
OROMUCOSAL | Status: AC
  Filled 2022-02-17: qty 15

## 2022-02-17 MED ORDER — FENTANYL CITRATE (PF) 100 MCG/2ML IJ SOLN: 25.0000 ug | INTRAMUSCULAR | Status: DC | PRN

## 2022-02-17 MED ORDER — APREPITANT 40 MG PO CAPS
40.0000 mg | ORAL_CAPSULE | Freq: Once | ORAL | Status: AC
  Administered 2022-02-17: 40 mg via ORAL

## 2022-02-17 MED ORDER — SODIUM CHLORIDE 0.9 % IV SOLN
INTRAVENOUS | Status: AC
Start: 2022-02-17 — End: 2022-02-17

## 2022-02-17 MED ORDER — LACTATED RINGERS IV SOLN: INTRAVENOUS | Status: DC | PRN

## 2022-02-17 MED ORDER — SUGAMMADEX SODIUM 200 MG/2ML IV SOLN
INTRAVENOUS | Status: DC | PRN
  Administered 2022-02-17: 98 mg via INTRAVENOUS

## 2022-02-17 MED ORDER — PROCHLORPERAZINE EDISYLATE 10 MG/2ML IJ SOLN
10.0000 mg | Freq: Once | INTRAMUSCULAR | Status: AC
  Administered 2022-02-17: 10 mg via INTRAVENOUS
  Filled 2022-02-17: qty 2

## 2022-02-17 MED ORDER — DICLOFENAC SODIUM 1 % EX GEL
4.0000 g | Freq: Four times a day (QID) | CUTANEOUS | Status: DC
  Administered 2022-02-17 – 2022-02-21 (×14): 4 g via TOPICAL
  Filled 2022-02-17: qty 100

## 2022-02-17 MED ORDER — ONDANSETRON HCL 4 MG/2ML IJ SOLN: 4.0000 mg | Freq: Once | INTRAMUSCULAR | Status: AC | PRN

## 2022-02-17 MED ORDER — ACETAMINOPHEN 10 MG/ML IV SOLN: 735.0000 mg | Freq: Once | INTRAVENOUS | Status: DC | PRN

## 2022-02-17 MED ORDER — SODIUM CHLORIDE (PF) 0.9 % IJ SOLN
INTRAMUSCULAR | Status: AC
  Filled 2022-02-17: qty 50

## 2022-02-17 MED ORDER — ROCURONIUM BROMIDE 100 MG/10ML IV SOLN
INTRAVENOUS | Status: DC | PRN
  Administered 2022-02-17: 20 mg via INTRAVENOUS
  Administered 2022-02-17: 30 mg via INTRAVENOUS

## 2022-02-17 MED ORDER — DEXAMETHASONE SODIUM PHOSPHATE 10 MG/ML IJ SOLN
INTRAMUSCULAR | Status: DC | PRN
  Administered 2022-02-17: 10 mg via INTRAVENOUS

## 2022-02-17 MED ORDER — ROCURONIUM BROMIDE 10 MG/ML (PF) SYRINGE
PREFILLED_SYRINGE | INTRAVENOUS | Status: AC
  Filled 2022-02-17: qty 10

## 2022-02-17 MED ORDER — SODIUM CHLORIDE 0.9 % IV SOLN
INTRAVENOUS | Status: AC
  Filled 2022-02-17: qty 2

## 2022-02-17 MED ORDER — SUCCINYLCHOLINE CHLORIDE 200 MG/10ML IV SOSY
PREFILLED_SYRINGE | INTRAVENOUS | Status: AC
  Filled 2022-02-17: qty 10

## 2022-02-17 MED ORDER — FENTANYL CITRATE (PF) 100 MCG/2ML IJ SOLN
INTRAMUSCULAR | Status: DC | PRN
  Administered 2022-02-17: 50 ug via INTRAVENOUS

## 2022-02-17 MED ORDER — OXYCODONE HCL 5 MG PO TABS: 5.0000 mg | ORAL_TABLET | Freq: Once | ORAL | Status: DC | PRN

## 2022-02-17 MED ORDER — ENOXAPARIN SODIUM 40 MG/0.4ML IJ SOSY
40.0000 mg | PREFILLED_SYRINGE | INTRAMUSCULAR | Status: DC
  Administered 2022-02-18 – 2022-02-21 (×4): 40 mg via SUBCUTANEOUS
  Filled 2022-02-17 (×4): qty 0.4

## 2022-02-17 MED ORDER — MIDAZOLAM HCL 2 MG/2ML IJ SOLN
INTRAMUSCULAR | Status: AC
  Filled 2022-02-17: qty 2

## 2022-02-17 MED ORDER — KETOROLAC TROMETHAMINE 15 MG/ML IJ SOLN
15.0000 mg | Freq: Four times a day (QID) | INTRAMUSCULAR | Status: DC | PRN
Start: 2022-02-17 — End: 2022-02-21
  Administered 2022-02-17 – 2022-02-18 (×3): 15 mg via INTRAVENOUS
  Filled 2022-02-17 (×3): qty 1

## 2022-02-17 MED ORDER — PHENYLEPHRINE HCL (PRESSORS) 10 MG/ML IV SOLN
INTRAVENOUS | Status: DC | PRN
  Administered 2022-02-17: 40 ug via INTRAVENOUS
  Administered 2022-02-17 (×2): 80 ug via INTRAVENOUS
  Administered 2022-02-17: 40 ug via INTRAVENOUS

## 2022-02-17 MED ORDER — LIDOCAINE HCL (PF) 2 % IJ SOLN
INTRAMUSCULAR | Status: AC
  Filled 2022-02-17: qty 5

## 2022-02-17 MED ORDER — APREPITANT 40 MG PO CAPS
ORAL_CAPSULE | ORAL | Status: AC
  Filled 2022-02-17: qty 1

## 2022-02-17 SURGICAL SUPPLY — 42 items
APPLICATOR COTTON TIP 6 STRL (MISCELLANEOUS) ×1 IMPLANT
APPLICATOR COTTON TIP 6IN STRL (MISCELLANEOUS) IMPLANT
DERMABOND ADVANCED (GAUZE/BANDAGES/DRESSINGS) ×1
DERMABOND ADVANCED .7 DNX12 (GAUZE/BANDAGES/DRESSINGS) ×1 IMPLANT
DEVICE SECURE STRAP 25 ABSORB (INSTRUMENTS) IMPLANT
DRAPE INCISE IOBAN 66X45 STRL (DRAPES) ×1 IMPLANT
ELECT REM PT RETURN 9FT ADLT (ELECTROSURGICAL) ×1
ELECTRODE REM PT RTRN 9FT ADLT (ELECTROSURGICAL) ×1 IMPLANT
GLOVE BIO SURGEON STRL SZ7 (GLOVE) ×1 IMPLANT
GLOVE BIOGEL PI IND STRL 7.0 (GLOVE) IMPLANT
GLOVE BIOGEL PI INDICATOR 7.0 (GLOVE) ×2
GOWN STRL REUS W/ TWL LRG LVL3 (GOWN DISPOSABLE) ×2 IMPLANT
GOWN STRL REUS W/TWL LRG LVL3 (GOWN DISPOSABLE) ×3
GRASPER SUT TROCAR 14GX15 (MISCELLANEOUS) ×1 IMPLANT
HANDLE YANKAUER SUCT BULB TIP (MISCELLANEOUS) ×1 IMPLANT
IRRIGATION STRYKERFLOW (MISCELLANEOUS) IMPLANT
IRRIGATOR STRYKERFLOW (MISCELLANEOUS)
IV NS 1000ML (IV SOLUTION) ×1
IV NS 1000ML BAXH (IV SOLUTION) ×1 IMPLANT
L-HOOK LAP DISP 36CM (ELECTROSURGICAL)
LHOOK LAP DISP 36CM (ELECTROSURGICAL) ×1 IMPLANT
MANIFOLD NEPTUNE II (INSTRUMENTS) ×1 IMPLANT
NDL INSUFF ACCESS 14 VERSASTEP (NEEDLE) ×1 IMPLANT
NEEDLE HYPO 22GX1.5 SAFETY (NEEDLE) ×1 IMPLANT
NS IRRIG 500ML POUR BTL (IV SOLUTION) ×1 IMPLANT
PACK LAP CHOLECYSTECTOMY (MISCELLANEOUS) ×1 IMPLANT
SCISSORS METZENBAUM CVD 33 (INSTRUMENTS) ×1 IMPLANT
SET TUBE SMOKE EVAC HIGH FLOW (TUBING) ×1 IMPLANT
SHEARS HARMONIC ACE PLUS 36CM (ENDOMECHANICALS) IMPLANT
SLEEVE ENDOPATH XCEL 5M (ENDOMECHANICALS) ×1 IMPLANT
SPONGE T-LAP 18X18 ~~LOC~~+RFID (SPONGE) ×1 IMPLANT
SUT ETHIBOND 0 MO6 C/R (SUTURE) IMPLANT
SUT MNCRL 4-0 (SUTURE) ×1
SUT MNCRL 4-0 27XMFL (SUTURE) ×1
SUT MNCRL AB 4-0 PS2 18 (SUTURE) ×1 IMPLANT
SUT VIC AB 0 SH 27 (SUTURE) ×1 IMPLANT
SUT VICRYL 0 AB UR-6 (SUTURE) ×1 IMPLANT
SUTURE MNCRL 4-0 27XMF (SUTURE) IMPLANT
TRAP FLUID SMOKE EVACUATOR (MISCELLANEOUS) ×1 IMPLANT
TROCAR XCEL NON-BLD 11X100MML (ENDOMECHANICALS) ×1 IMPLANT
TROCAR XCEL NON-BLD 5MMX100MML (ENDOMECHANICALS) ×2 IMPLANT
WATER STERILE IRR 500ML POUR (IV SOLUTION) ×1 IMPLANT

## 2022-02-17 NOTE — Anesthesia Procedure Notes (Signed)
Procedure Name: Intubation Date/Time: 02/17/2022 9:45 AM  Performed by: Demetrius Charity, CRNAPre-anesthesia Checklist: Patient identified, Patient being monitored, Timeout performed, Emergency Drugs available and Suction available Patient Re-evaluated:Patient Re-evaluated prior to induction Oxygen Delivery Method: Circle system utilized Preoxygenation: Pre-oxygenation with 100% oxygen Induction Type: IV induction and Rapid sequence Laryngoscope Size: 3 and McGraph Grade View: Grade I Tube type: Oral Tube size: 6.5 mm Number of attempts: 1 Airway Equipment and Method: Stylet Placement Confirmation: ETT inserted through vocal cords under direct vision, positive ETCO2 and breath sounds checked- equal and bilateral Secured at: 20 cm Tube secured with: Tape Dental Injury: Teeth and Oropharynx as per pre-operative assessment

## 2022-02-17 NOTE — Progress Notes (Addendum)
[facility] Day(s): 5.   Post op day(s): 3 Days Post-Op.   Interval History:  Patient seen and examined Overnight had issues with nausea and noted swelling to right laparoscopic port She underwent CT Abdomen/Pelvis which was concerning for new hernia to right lateral port site, containing small bowel This morning, patient reports she continues to have significant tenderness to the right lateral port sites; nausea, emesis overnight No fever, chills Previous leukocytosis improving; 11.4K Hgb 11.9 Stable hyponatremia; 129 Renal function normal; sCr - 0.54; UO - unmeasured  No further electrolyte derangements She is NPO this morning   Vital signs in last 24 hours: [min-max] current  Temp:  [97.5 F (36.4 C)-98.2 F (36.8 C)] 98.2 F (36.8 C) (08/18 0519) Pulse Rate:  [81-95] 94 (08/18 0519) Resp:  [12-22] 20 (08/18 0519) BP: (122-178)/(67-90) 144/82 (08/18 0519) SpO2:  [97 %-100 %] 98 % (08/18 0519)     Height: 5' (152.4 cm) Weight: 49 kg BMI (Calculated): 21.09   Intake/Output last 2 shifts:  08/17 0701 - 08/18 0700 In: 720 [P.O.:720] Out: -    Physical Exam:  Constitutional: alert, cooperative and no distress  Respiratory: breathing non-labored at rest  Cardiovascular: regular rate and sinus rhythm  Gastrointestinal: To her right lateral port sites there is clear swelling, there is certainly bowel underneath which has herniated through one of these sites, this is tender. She did allow multiple attempts at reduction which were unsuccessful. She is not overtly distended. No rebound/guarding.  Integumentary: Laparoscopic sites are CDI with dermabond, no erythema or drainage   Labs:     Latest Ref Rng & Units 02/17/2022    6:07 AM 02/17/2022    2:57 AM 02/15/2022    5:29 AM  CBC  WBC 4.0 - 10.5 K/uL 11.4  13.2  12.0   Hemoglobin 12.0 - 15.0 g/dL 11.9  12.6  12.3   Hematocrit 36.0 - 46.0 % 34.7  36.6  34.4   Platelets 150 -  400 K/uL 328  355  397       Latest Ref Rng & Units 02/16/2022    1:06 PM 02/16/2022    4:53 AM 02/15/2022    9:28 PM  CMP  Glucose 70 - 99 mg/dL 151     BUN 8 - 23 mg/dL 12     Creatinine 0.44 - 1.00 mg/dL 0.59     Sodium 135 - 145 mmol/L 129  129  127   Potassium 3.5 - 5.1 mmol/L 3.9     Chloride 98 - 111 mmol/L 97     CO2 22 - 32 mmol/L 23     Calcium 8.9 - 10.3 mg/dL 9.2        Imaging studies:   CT Abdomen/Pelvis (02/17/2022) personally reviewed showing new right lateral abdominal hernia, this is likely at laparoscopic port site, this also does contain small bowel, possible early bowel obstruction, and radiologist report reviewed below: IMPRESSION: New defect in the right anterior abdominal wall which may be related to the recent laparoscopic surgery. Herniated small bowel loops are identified within this defect causing a very early partial small bowel obstruction.   Changes of recent cholecystectomy without biloma.   Assessment/Plan:  81 y.o. female now with small bowel containing hernia to the right lateral port sites which does seem incarcerated and resulting in SBO, no evidence of strangulation,  3 Days Post-Op s/p robotic assisted laparoscopic cholecystectomy for chronic cholecystitis, complicated by stable and improved hyponatremia.   -  Unfortunately, she has developed hernia to the right port sites, difficult to tell on examination whether this the more lateral or medial one (suspect more lateral). This does contain small bowel based on imaging and examination and seems to be causing at least partial SBO. I made multiple attempts at reduction at bedside without success. I do feel that she will need to return to the OR for hernia repair. We will plan on proceeding with laparoscopic ventral hernia repair this afternoon with Dr Dahlia Byes as soon as OR time is available.   - All risks, benefits, and alternatives to above procedure(s) were discussed with the patient, all of her  questions were answered to her expressed satisfaction, patient expresses she wishes to proceed, and informed consent was obtained.   - NPO + IVF support - IV Abx (Cefotetan) on call to OR - Monitor abdominal examination; on-going bowel function   - Pain control prn; antiemetics prn   - Further management per primary service; we will follow    All of the above findings and recommendations were discussed with the patient, patient's family (I will call patient's daughter), and the medical team, and all of patient's and family's questions were answered to their expressed satisfaction.  -- Edison Simon, PA-C [facility] 02/17/2022, 7:16 AM M-F: 7am - 4pm

## 2022-02-17 NOTE — Op Note (Signed)
Laparoscopic repair of incisional hernias x 2  Pre-operative Diagnosis: Incarcerated incisional hernia  Post-operative Diagnosis: same  Procedure:  Laparoscopic repair of incisional hernias x 2 total area 3 cms  Surgeon: Caroleen Hamman, MD FACS  Anesthesia: Gen. with endotracheal tube  Assistant: Otho Ket PA-C   Findings: Incarcerated incisional hernia with bowel , Contusion of the small bowel but no perforation or necrosis  Estimated Blood Loss: 5cc         Drains: none         Complications: none   Procedure Details  The patient was seen again in the Holding Room. The benefits, complications, treatment options, and expected outcomes were discussed with the patient. The risks of bleeding, infection, recurrence of symptoms, failure to resolve symptoms, , bowel injury, any of which could require further surgery  were reviewed with the patient. The likelihood of improving the patient's symptoms with return to their baseline status is good.  The patient and/or family concurred with the proposed plan, giving informed consent.  The patient was taken to Operating Room, identified as [name] [name] and the procedure verified . A Time Out was held and the above information confirmed.  Prior to the induction of general anesthesia, antibiotic prophylaxis was administered. VTE prophylaxis was in place. General endotracheal anesthesia was then administered and tolerated well. After the induction, the abdomen was prepped with Chloraprep and draped in the sterile fashion. The patient was positioned in the supine position.   I used a Veres needle approach and inserted it at [employer] point. Appropiate saline drop test performed. Pneumoperitoneum obtained w/o hemodynamic compromise. A 5 mm port was placed using optiview technique on the Left lower quadrant. Inspection revealed no evidence of injuries. Bowel was incarcerated in one of the robotic port incisions. I was able to use Hunter grasper to  slowly and gently reduce the bowel back to the abdominal cavity. No evidence of perforation or injuries were noted. Bowel was viable.  I decided to close the defect using a Carter-Thomason device and PMI with a 0 vicryl in a figure of eight fashion, identical closure was used to close the left upper quadrant robotic fascial defect. Both defects encompassed an area of repair of 3 cms.  Inspection of the right upper quadrant was performed. No bleeding, bile duct injury or leak, or bowel injury was noted. Pneumoperitoneum was released.  4-0 subcuticular Monocryl was used to close the skin. Dermabond was  applied.  The patient was then extubated and brought to the recovery room in stable condition. Sponge, lap, and needle counts were correct at closure and at the conclusion of the case.  Please note that Otho Ket PA-C was required due to the complexity of the case , to hold the camera and provide assistance during laparoscopic closure.      Caroleen Hamman, MD, FACS

## 2022-02-17 NOTE — Progress Notes (Signed)
[employer] - s/p day 2 lap chole 1.  No urine output sine 1600 8/17 per nurse. Bladder scan 305 ml 2.  Nurse also reports enlarged area around RLQ abdominal lap site that has expanded though out night as well as small amount sanguinous drainage   Action - 1.  IV fluids started 100 ml.h for 10 hours for low urine production 2.  Informed Dr Dahlia Byes of enlarged, painful area of abdomen  CBC - WBC up slightly 13.2 HGB stable at 12.6 CT abdomen IMPRESSION: New defect in the right anterior abdominal wall which may be related to the recent laparoscopic surgery. Herniated small bowel loops are identified within this defect causing a very early partial small bowel obstruction.  Changes of recent cholecystectomy without biloma.  Abdominal binder   Kathlene Cote NP [facility]

## 2022-02-17 NOTE — Anesthesia Postprocedure Evaluation (Signed)
Anesthesia Post Note  Patient: DAWSYN ZURN  Procedure(s) Performed: LAPAROSCOPIC VENTRAL HERNIA (Right)  Patient location during evaluation: PACU Anesthesia Type: General Level of consciousness: awake and alert, oriented and patient cooperative Pain management: pain level controlled Vital Signs Assessment: post-procedure vital signs reviewed and stable Respiratory status: spontaneous breathing, nonlabored ventilation and respiratory function stable Cardiovascular status: blood pressure returned to baseline and stable Postop Assessment: adequate PO intake Anesthetic complications: no   No notable events documented.   Last Vitals:  Vitals:   02/17/22 1100 02/17/22 1115  BP: (!) 160/83 (!) 157/91  Pulse: 92 99  Resp: (!) 21 (!) 23  Temp:    SpO2: 96% 97%    Last Pain:  Vitals:   02/17/22 1100  TempSrc:   PainSc: 0-No pain                 Darrin Nipper

## 2022-02-17 NOTE — Progress Notes (Signed)
PT Cancellation Note  Patient Details Name: [name] [name] MRN: [id number] DOB: May 11, 1941   Cancelled Treatment:    Reason Eval/Treat Not Completed: Patient at procedure or test/unavailable.  Chart reviewed.  Pt currently off floor for surgical intervention.  Will re-attempt PT session at a later date/time as medically appropriate.  Leitha Bleak, PT 02/17/22, 9:25 AM

## 2022-02-17 NOTE — Anesthesia Preprocedure Evaluation (Addendum)
Anesthesia Evaluation  Patient identified by MRN, date of birth, ID band Patient awake    Reviewed: Allergy & Precautions, NPO status , Patient's Chart, lab work & pertinent test results  History of Anesthesia Complications (+) PONV and history of anesthetic complications  Airway Mallampati: III   Neck ROM: Full    Dental no notable dental hx.    Pulmonary neg pulmonary ROS,    Pulmonary exam normal breath sounds clear to auscultation       Cardiovascular hypertension, Normal cardiovascular exam Rhythm:Regular Rate:Normal  ECG 02/12/22: normal   Neuro/Psych CVA, No Residual Symptoms    GI/Hepatic GERD  ,  Endo/Other  Hypothyroidism   Renal/GU negative Renal ROS     Musculoskeletal   Abdominal   Peds  Hematology Breast CA; skin SCC   Anesthesia Other Findings   Reproductive/Obstetrics                            Anesthesia Physical Anesthesia Plan  ASA: 3  Anesthesia Plan: General   Post-op Pain Management:    Induction: Intravenous  PONV Risk Score and Plan: 4 or greater and Ondansetron, Dexamethasone, Treatment may vary due to age or medical condition and TIVA  Airway Management Planned: Oral ETT  Additional Equipment:   Intra-op Plan:   Post-operative Plan: Extubation in OR  Informed Consent: I have reviewed the patients History and Physical, chart, labs and discussed the procedure including the risks, benefits and alternatives for the proposed anesthesia with the patient or authorized representative who has indicated his/her understanding and acceptance.   Patient has DNR.  Discussed DNR with patient and Suspend DNR.   Dental advisory given  Plan Discussed with: CRNA  Anesthesia Plan Comments: (Patient consented for risks of anesthesia including but not limited to:  - adverse reactions to medications - damage to eyes, teeth, lips or other oral mucosa - nerve damage  due to positioning  - sore throat or hoarseness - damage to heart, brain, nerves, lungs, other parts of body or loss of life  Informed patient about role of CRNA in peri- and intra-operative care.  Patient voiced understanding.)       Anesthesia Quick Evaluation

## 2022-02-17 NOTE — Transfer of Care (Signed)
Immediate Anesthesia Transfer of Care Note  Patient: MONZERAT HANDLER  Procedure(s) Performed: LAPAROSCOPIC VENTRAL HERNIA (Right)  Patient Location: PACU  Anesthesia Type:General  Level of Consciousness: drowsy  Airway & Oxygen Therapy: Patient Spontanous Breathing and Patient connected to face mask oxygen  Post-op Assessment: Report given to RN and Post -op Vital signs reviewed and stable  Post vital signs: Reviewed and stable  Last Vitals:  Vitals Value Taken Time  BP 149/83 02/17/22 1038  Temp    Pulse 85 02/17/22 1042  Resp 12 02/17/22 1042  SpO2 99 % 02/17/22 1042  Vitals shown include unvalidated device data.  Last Pain:  Vitals:   02/17/22 0835  TempSrc: Temporal  PainSc: 2       Patients Stated Pain Goal: 0 (76/16/07 3710)  Complications: No notable events documented.

## 2022-02-17 NOTE — Assessment & Plan Note (Signed)
Overnight the patient developed a port hernia with bowel incarceration. She was taken back to the OR this morning for laparoscopic repair. The patient was found to have contusion of bowel without perforation or necrosis. She has tolerated the procedure well. Surgery has placed her on a clear liquid diet.

## 2022-02-17 NOTE — Progress Notes (Signed)
PROGRESS NOTE  ROCHELL PUETT GYF:[id number] DOB: 02/07/1941 DOA: 02/12/2022 PCP: Idelle Crouch, MD  Brief History   HPI on admission: "ZOE GOONAN is a 81 y.o. female with medical history significant of HTN, HLD, stroke (patient is not aware of this diagnosis),hypothyroidism, left breast cancer (s/p for lumpectomy and radiation therapy), skin cancer, diverticulosis, post herpetic neuralgia, gallstone, possible esophagitis, who presents with syncope and abdominal pain.   Patient states that she has right upper quadrant abdominal pain for several months.  She was diagnosed as gallstone.  Right upper quadrant ultrasound on 01/18/2022 showed tiny gallstones without evidence of cholecystitis.  Patient was seen by Dr. Dahlia Byes of surgery on 7/24, planning to do surgery, however patient was found to have hyponatremia, therefore surgery has not been scheduled yet.  Patient states that her abdominal pain has worsened the last night. It is located in the right lower quadrant and epigastric area, constant, aching and burning-like pain, 8 out of 10 in severity, radiating to the middle back.  Associated with nausea, but no vomiting, diarrhea or abdominal pain.  No fever or chills.  Patient does not have chest pain, cough, shortness breath.  Denies symptoms of UTI. Of note, pt is started on Diflucan on 8/3 for possible esophagitis by GI."   She had a brief syncopal episode in the ED, found to be bradycardic, but not hypotensive.  Metoprolol held.  HR subsequently stable   Labs notable for hyponatremia with Na 126.  UA was concerning for infection, pt started on empiric antibiotics pending cultures.  CT head negative.     Admitted to the hospital and started on IV fluids. Nephrology consulted after Na fell to 122 after initial fluids were given.  General surgery consulted given plan for cholecystectomy.  The patient underwent a robotic assisted laparoscopic cholecystectomy on 02/14/2022 with Dr. Dahlia Byes. She has  tolerated the procedure well.  The patient's sodium dropped to 127 following surgery, but has recovered to 129 for the past 2 days.  Overnight the patient developed a port hernia with bowel incarceration. She was taken back to the OR this morning for laparoscopic repair. The patient was found to have contusion of bowel without perforation or necrosis. She has tolerated the procedure well. Surgery has placed her on a clear liquid diet.  Consultants  Nephrology General Surgery  Procedures  robotic assisted laparoscopic cholecystectomy   Antibiotics   Anti-infectives (From admission, onward)    Start     Dose/Rate Route Frequency Ordered Stop   02/17/22 1015  sodium chloride 0.9 % with cefoTEtan (CEFOTAN) ADS Med       Note to Pharmacy: Trudie Reed S: cabinet override      02/17/22 1015 02/17/22 1018   02/17/22 0830  cefoTEtan (CEFOTAN) 2 g in sodium chloride 0.9 % 100 mL IVPB        2 g 200 mL/hr over 30 Minutes Intravenous On call to O.R. 02/17/22 0735 02/17/22 1020   02/12/22 1000  fluconazole (DIFLUCAN) tablet 100 mg        100 mg Oral Daily 02/12/22 0816     02/12/22 0830  cefTRIAXone (ROCEPHIN) 1 g in sodium chloride 0.9 % 100 mL IVPB  Status:  Discontinued        1 g 200 mL/hr over 30 Minutes Intravenous Every 24 hours 02/12/22 0818 02/16/22 1449      Subjective  The patient is resting comfortably. No new complaints other than soreness. Objective   Vitals:  Vitals:  02/17/22 1153 02/17/22 1602  BP: (!) 167/88 (!) 148/84  Pulse: (!) 102 97  Resp: 18   Temp: 97.8 F (36.6 C) 98 F (36.7 C)  SpO2: 95% 97%    Exam:  Constitutional:  The patient is awake and alert. No acute distress. Respiratory:  No increased work of breathing. No wheezes, rales, or rhonchi No tactile fremitus Cardiovascular:  Regular rate and rhythm No murmurs, ectopy, or gallups. No lateral PMI. No thrills. Abdomen:  Abdomen is soft, slightly tender, non-distended No hernias, masses, or  organomegaly Hypoactive bowel sounds.  Musculoskeletal:  No cyanosis, clubbing, or edema Skin:  No rashes, lesions, ulcers palpation of skin: no induration or nodules Neurologic:  CN 2-12 intact Sensation all 4 extremities intact Psychiatric:  Mental status Mood, affect appropriate Orientation to person, place, time  judgment and insight appear intact   I have personally reviewed the following:   Today's Data  Vitals  Lab Data  CBC BMP  Micro Data  Urine culture 02/12/2022 has had poly microbial growth.   Imaging  CT chest abdomen pelvis CT head  Cardiology Data  EKG Echocardiogram  Other Data    Scheduled Meds:  acetaminophen  1,000 mg Oral Q6H   aprepitant       chlorhexidine       diclofenac Sodium  4 g Topical QID   [START ON 02/18/2022] enoxaparin (LOVENOX) injection  40 mg Subcutaneous Q24H   fluconazole  100 mg Oral Daily   pantoprazole  40 mg Oral Daily   sodium chloride  1 g Oral BID WC   sucralfate  1 g Oral QID    Principal Problem:   Hyponatremia Active Problems:   Syncope   Cholelithiasis   Abdominal pain   History of CVA (cerebrovascular accident)   Hypertension   HLD (hyperlipidemia)   UTI (urinary tract infection)   Hypothyroidism, adult   Esophagitis   Hypokalemia   Port-site hernia   LOS: 5 days   A & P  Assessment and Plan: * Hyponatremia Na 126 on admission.  Hypotonic.  On exam pt appears mildly hypovolemic.  Suspect due to poor oral intake and dehydration.  Pt reports drinking about three 12 oz glasses of water daily, sometimes less.  Other differential diagnosis includes SIADH.  TSH normal, free T4 mildly elevated. After initial fluids given, Na repeat was 122. Nephrology was consulted and recommended fluid restriction and salt tablets, suspecting SIADH.   The patient was initially continued on IV fluids with fluid restriction and salt tablets given. Her sodium improved to 133. The fluids were then stopped on 02/13/2022.  Na dropped to 130 on the morning of 02/14/2022. Then they dropped to 125 on the morning of 02/15/2022. Nephrology gave one dose of tolvaptan. Na is stable at 129 for the last 2 days.    Syncope Etiology is not clear, suspect bradycardic episode or vasovagal syncope, orthostatic, TIA/stroke, cardiac arrhythmia, ACS (less likely, given no chest pain and normal trop) MRI brain negative. Likely related to cholecystitis.  Echo with normal EF, mild MR. -- Orthostatic vital signs  -- Neuro checks  --PT/OT --Telemetry  Cholelithiasis RUQ ultrasound on 01/18/2022 showed tiny gallstone, without evidence of cholecystitis.  On admission, no fever or leukocytosis.  Lipase normal 35, liver function normal. --General surgery following --Robotic assisted cholecystectomy on 02/14/2022.  --Supportive care - antiemetics, pain control PRN    Abdominal pain Likely due to gallstone/cholecystitis.   CTA negative for dissection. --General surgery following --S/P Robotic assisted  cholecystectomy on 02/14/2022.    History of CVA (cerebrovascular accident) Stroke is listed in medical problem list, but the patient is not aware of having prior stroke.   Not taking aspirin or pravastatin currently. MRI brain no acute findings, mild small vessel ischemic diesase. --Hold off aspirin due to possible esophagitis    Hypertension Patient reports not taking Cozaar. BP's mildly elevated --IV hydralazine as needed for now  HLD (hyperlipidemia) Patient is not taking pravastatin -Follow-up with PCP  UTI (urinary tract infection) Empiric IV Rocephin Urine culture has had polymicrobial growth.  Hypothyroidism, adult Patient is not taking medications currently TSH normal. Free T4 mildly elevated. PCP follow up.  Port-site hernia Overnight the patient developed a port hernia with bowel incarceration. She was taken back to the OR this morning for laparoscopic repair. The patient was found to have contusion of  bowel without perforation or necrosis. She has tolerated the procedure well. Surgery has placed her on a clear liquid diet.  Hypokalemia K 3.5 today. Monitor and supplement as necessary. Monitor BMP, replace K PRN.  Esophagitis - Continue Diflucan -Continue Protonix and Carafate   I have seen and examined this patient myself. I have spent 34 minutes in her evaluation and care.  DVT prophylaxis: Lovenox Code Status: Partial Code Family Communication: Family at bedside Disposition Plan: home    Zaeden Lastinger, DO [facility] Direct contact: see [email or web address]  7PM-7AM contact night coverage as above 02/17/2022, 7:04 PM  LOS: 3 days

## 2022-02-17 NOTE — Progress Notes (Signed)
PHARMACIST - PHYSICIAN COMMUNICATION  CONCERNING:  Enoxaparin (Lovenox) for DVT Prophylaxis    RECOMMENDATION: Patient was prescribed enoxaprin for VTE prophylaxis.   Filed Weights   02/12/22 0427 02/17/22 0835  Weight: 49 kg (108 lb) 49 kg (108 lb 0.4 oz)    Body mass index is 21.1 kg/m.  Estimated Creatinine Clearance: 39.6 mL/min (by C-G formula based on SCr of 0.54 mg/dL).   Based on [place] patient is candidate for enoxaparin 40 mg every 24 hours   DESCRIPTION: Pharmacy has adjusted enoxaparin dose per [facility] policy.  Patient is now receiving enoxaparin 40 mg every 24 hours    Dallie Piles, PharmD Clinical Pharmacist  02/17/2022 10:59 AM

## 2022-02-18 ENCOUNTER — Encounter: Payer: Self-pay | Admitting: Surgery

## 2022-02-18 DIAGNOSIS — K430 Incisional hernia with obstruction, without gangrene: Secondary | ICD-10-CM | POA: Diagnosis not present

## 2022-02-18 DIAGNOSIS — E871 Hypo-osmolality and hyponatremia: Secondary | ICD-10-CM | POA: Diagnosis not present

## 2022-02-18 DIAGNOSIS — G2581 Restless legs syndrome: Secondary | ICD-10-CM | POA: Diagnosis not present

## 2022-02-18 LAB — CBC WITH DIFFERENTIAL/PLATELET
Abs Immature Granulocytes: 0.12 10*3/uL — ABNORMAL HIGH (ref 0.00–0.07)
Basophils Absolute: 0 10*3/uL (ref 0.0–0.1)
Basophils Relative: 0 %
Eosinophils Absolute: 0 10*3/uL (ref 0.0–0.5)
Eosinophils Relative: 0 %
HCT: 33.7 % — ABNORMAL LOW (ref 36.0–46.0)
Hemoglobin: 11.6 g/dL — ABNORMAL LOW (ref 12.0–15.0)
Immature Granulocytes: 1 %
Lymphocytes Relative: 12 %
Lymphs Abs: 1.3 10*3/uL (ref 0.7–4.0)
MCH: 29.8 pg (ref 26.0–34.0)
MCHC: 34.4 g/dL (ref 30.0–36.0)
MCV: 86.6 fL (ref 80.0–100.0)
Monocytes Absolute: 0.7 10*3/uL (ref 0.1–1.0)
Monocytes Relative: 7 %
Neutro Abs: 8.9 10*3/uL — ABNORMAL HIGH (ref 1.7–7.7)
Neutrophils Relative %: 80 %
Platelets: 382 10*3/uL (ref 150–400)
RBC: 3.89 MIL/uL (ref 3.87–5.11)
RDW: 13.7 % (ref 11.5–15.5)
WBC: 11.1 10*3/uL — ABNORMAL HIGH (ref 4.0–10.5)
nRBC: 0 % (ref 0.0–0.2)

## 2022-02-18 LAB — BASIC METABOLIC PANEL
Anion gap: 9 (ref 5–15)
BUN: 11 mg/dL (ref 8–23)
CO2: 25 mmol/L (ref 22–32)
Calcium: 9.3 mg/dL (ref 8.9–10.3)
Chloride: 96 mmol/L — ABNORMAL LOW (ref 98–111)
Creatinine, Ser: 0.59 mg/dL (ref 0.44–1.00)
GFR, Estimated: >60 mL/min (ref >60)
Glucose, Bld: 110 mg/dL — ABNORMAL HIGH (ref 70–99)
Potassium: 3.6 mmol/L (ref 3.5–5.1)
Sodium: 130 mmol/L — ABNORMAL LOW (ref 135–145)

## 2022-02-18 MED ORDER — PREGABALIN 25 MG PO CAPS
25.0000 mg | ORAL_CAPSULE | Freq: Two times a day (BID) | ORAL | Status: DC
  Administered 2022-02-18 – 2022-02-19 (×3): 25 mg via ORAL
  Filled 2022-02-18 (×3): qty 1

## 2022-02-18 NOTE — Progress Notes (Signed)
[facility]  ROUNDING NOTE   Subjective:   Patient seen resting in bed, daughter at bedside Alert and oriented Diet advanced to full liquid diet, tolerating clears Complains of soreness Remains on room air No lower extremity edema  Sodium 130  Objective:  Vital signs in last 24 hours:  Temp:  [97.6 F (36.4 C)-98.2 F (36.8 C)] 97.6 F (36.4 C) (08/19 0414) Pulse Rate:  [86-97] 90 (08/19 0723) Resp:  [16-20] 16 (08/19 0723) BP: (139-162)/(78-85) 162/85 (08/19 0723) SpO2:  [97 %-100 %] 100 % (08/19 0723)  Weight change:  Filed Weights   02/12/22 0427 02/17/22 0835  Weight: 49 kg 49 kg    Intake/Output: I/O last 3 completed shifts: In: 63 [P.O.:600; I.V.:100; IV Piggyback:100] Out: 2 [Blood:2]   Intake/Output this shift:  Total I/O In: 480 [P.O.:480] Out: -   Physical Exam: General: NAD  Head: Normocephalic, atraumatic. Moist oral mucosal membranes  Eyes: Anicteric  Lungs:  Clear to auscultation, normal effort, room air  Heart: Regular rate and rhythm  Abdomen:  Soft, tender, nondistended, surgical lap sites  Extremities: No peripheral edema.  Neurologic: Nonfocal, moving all four extremities, bilateral upper extremity tremors  Skin: No lesions  Access: None    Basic Metabolic Panel: Recent Labs  Lab 02/12/22 1453 02/12/22 2002 02/14/22 1957 02/15/22 0529 02/15/22 2128 02/16/22 0453 02/16/22 1306 02/17/22 0607 02/18/22 0627  NA 125*   < > 129* 125* 127* 129* 129* 129* 130*  K 4.0   < > 3.9 4.0  --   --  3.9 3.5 3.6  CL 92*   < > 96* 91*  --   --  97* 99 96*  CO2 21*   < > 22 25  --   --  '23 23 25  '$ GLUCOSE 107*   < > 230* 164*  --   --  151* 117* 110*  BUN 6*   < > 7* 6*  --   --  '12 19 11  '$ CREATININE 0.49   < > 0.65 0.48  --   --  0.59 0.54 0.59  CALCIUM 8.7*   < > 8.8* 9.4  --   --  9.2 9.0 9.3  MG 1.7  --   --  1.7  --   --   --  1.9  --    < > = values in this interval not displayed.     Liver Function Tests: Recent Labs   Lab 02/12/22 0432 02/12/22 1453 02/15/22 0529 02/17/22 0607  AST 26 20 50* 31  ALT 19 16 37 29  ALKPHOS 57 50 49 44  BILITOT 0.8 0.6 0.5 0.5  PROT 7.0 6.4* 6.8 6.2*  ALBUMIN 4.1 3.7 4.1 3.5    Recent Labs  Lab 02/12/22 0432  LIPASE 35    No results for input(s): "AMMONIA" in the last 168 hours.  CBC: Recent Labs  Lab 02/12/22 0432 02/15/22 0529 02/17/22 0257 02/17/22 0607 02/18/22 0627  WBC 10.2 12.0* 13.2* 11.4* 11.1*  NEUTROABS  --   --   --  9.3* 8.9*  HGB 12.6 12.3 12.6 11.9* 11.6*  HCT 37.0 34.4* 36.6 34.7* 33.7*  MCV 86.7 84.7 85.9 86.5 86.6  PLT 407* 397 355 328 382     Cardiac Enzymes: No results for input(s): "CKTOTAL", "CKMB", "CKMBINDEX", "TROPONINI" in the last 168 hours.  BNP: Invalid input(s): "POCBNP"  CBG: Recent Labs  Lab 02/12/22 0445  GLUCAP 111*     Microbiology: Results for orders  placed or performed during the hospital encounter of 02/12/22  Urine Culture     Status: Abnormal   Collection Time: 02/12/22  6:22 AM   Specimen: Urine, Clean Catch  Result Value Ref Range Status   Specimen Description   Final    URINE, CLEAN CATCH Performed at [facility], [address], [place], [place] [address]    Special Requests   Final    NONE Performed at [facility], [address] [place], [place] [address]    Culture MULTIPLE SPECIES PRESENT, SUGGEST RECOLLECTION (A)  Final   Report Status 02/13/2022 FINAL  Final    Coagulation Studies: Recent Labs    02/17/22 0607  LABPROT 13.5  INR 1.0     Urinalysis: No results for input(s): "COLORURINE", "LABSPEC", "PHURINE", "GLUCOSEU", "HGBUR", "BILIRUBINUR", "KETONESUR", "PROTEINUR", "UROBILINOGEN", "NITRITE", "LEUKOCYTESUR" in the last 72 hours.  Invalid input(s): "APPERANCEUR"     Imaging: CT ABDOMEN PELVIS WO CONTRAST  Result Date: 02/17/2022 CLINICAL DATA:  Abdominal pain following recent cholecystectomy EXAM: CT ABDOMEN AND PELVIS WITHOUT CONTRAST  TECHNIQUE: Multidetector CT imaging of the abdomen and pelvis was performed following the standard protocol without IV contrast. RADIATION DOSE REDUCTION: This exam was performed according to the departmental dose-optimization program which includes automated exposure control, adjustment of the mA and/or kV according to patient size and/or use of iterative reconstruction technique. COMPARISON:  02/12/2022 FINDINGS: Lower chest: Lung bases are free of acute infiltrate or sizable effusion. Hepatobiliary: Gallbladder has been surgically removed. Liver is within normal limits. No biloma is noted in the gallbladder fossa. Pancreas: Unremarkable. No pancreatic ductal dilatation or surrounding inflammatory changes. Spleen: Normal in size without focal abnormality. Adrenals/Urinary Tract: Adrenal glands are within normal limits. Kidneys demonstrate no renal calculi or obstructive changes. The bladder is well distended. Stomach/Bowel: Scattered fecal material is noted throughout the colon. No obstructive or inflammatory changes of the colon are noted. The appendix has been surgically removed. Multiple mildly dilated fluid-filled loops of small bowel are noted. These changes are secondary to herniated loops of small bowel through a defect in the right anterior abdominal wall best seen on images 44 and 43 of series 2. Distal most small bowel is within normal limits. Vascular/Lymphatic: Aortic atherosclerosis. No enlarged abdominal or pelvic lymph nodes. Reproductive: Status post hysterectomy. No adnexal masses. Other: No abdominal wall hernia or abnormality. No abdominopelvic ascites. Mild free air is noted related to the recent laparoscopic surgery. Musculoskeletal: No acute or significant osseous findings. IMPRESSION: New defect in the right anterior abdominal wall which may be related to the recent laparoscopic surgery. Herniated small bowel loops are identified within this defect causing a very early partial small bowel  obstruction. Changes of recent cholecystectomy without biloma. Electronically Signed   By: Inez Catalina M.D.   On: 02/17/2022 02:58     Medications:      acetaminophen  1,000 mg Oral Q6H   diclofenac Sodium  4 g Topical QID   enoxaparin (LOVENOX) injection  40 mg Subcutaneous Q24H   fluconazole  100 mg Oral Daily   pantoprazole  40 mg Oral Daily   pregabalin  25 mg Oral BID   sodium chloride  1 g Oral BID WC   sucralfate  1 g Oral QID   alum & mag hydroxide-simeth, hydrALAZINE, ketorolac, morphine injection, ondansetron (ZOFRAN) IV, oxyCODONE, senna  Assessment/ Plan:  [name] [name] is a 81 y.o.  female with a PMHx of hypertension, coronary artery disease, hyperlipidemia, history of CVA,  history of breast cancer s/p radiation and hysterectomy, diverticulosis and postherpetic neuralgia who is now admitted with history of abdominal pain and a syncopal episode.  Patient has history of cholelithiasis and was supposed to go for cholecystectomy.  Surgery was postponed secondary to her hyponatremia.  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  Sodium on admission 126 with lowest point of 122. Sodium initially corrected to 133 with IV fluids and salt tablets.    Sodium currently been maintained on salt tabs and fluid restriction.  Sodium 130 today.  We will continue to monitor.  2.  Chronic cholecystitis.  Underwent laparoscopic cholecystectomy on 02/14/2022 with hernia repair on 02/17/2022.  Surgery following.    LOS: 6 Everlene Cunning 8/19/20232:00 PM

## 2022-02-18 NOTE — Progress Notes (Signed)
Subjective:  CC: [name] [name] is a 81 y.o. female  Hospital stay day 6, 1 Day Post-Op laparoscopic repair of incisional hernia for incarcerated bowel with SBO  HPI: No acute issues overnight.  Passed some stool this am. Tolerating clears.  ROS:  General: Denies weight loss, weight gain, fatigue, fevers, chills, and night sweats. Heart: Denies chest pain, palpitations, racing heart, irregular heartbeat, leg pain or swelling, and decreased activity tolerance. Respiratory: Denies breathing difficulty, shortness of breath, wheezing, cough, and sputum. GI: Denies change in appetite, heartburn, nausea, vomiting, constipation, diarrhea, and blood in stool. GU: Denies difficulty urinating, pain with urinating, urgency, frequency, blood in urine.   Objective:   Temp:  [97.6 F (36.4 C)-99.1 F (37.3 C)] 97.6 F (36.4 C) (08/19 0414) Pulse Rate:  [86-103] 90 (08/19 0723) Resp:  [16-23] 16 (08/19 0723) BP: (139-167)/(78-91) 162/85 (08/19 0723) SpO2:  [95 %-100 %] 100 % (08/19 0723)     Height: 5' (152.4 cm) Weight: 49 kg BMI (Calculated): 21.1   Intake/Output this shift:   Intake/Output Summary (Last 24 hours) at 02/18/2022 1058 Last data filed at 02/18/2022 1610 Gross per 24 hour  Intake 700 ml  Output 0 ml  Net 700 ml    Constitutional :  alert, cooperative, appears stated age, and no distress  Respiratory:  clear to auscultation bilaterally  Cardiovascular:  regular rate and rhythm  Gastrointestinal: soft, non-tender; bowel sounds normal; no masses,  no organomegaly.   Skin: Cool and moist. Incisions c/d/i  Psychiatric: Normal affect, non-agitated, not confused       LABS:     Latest Ref Rng & Units 02/18/2022    6:27 AM 02/17/2022    6:07 AM 02/16/2022    1:06 PM  CMP  Glucose 70 - 99 mg/dL 110  117  151   BUN 8 - 23 mg/dL '11  19  12   '$ Creatinine 0.44 - 1.00 mg/dL 0.59  0.54  0.59   Sodium 135 - 145 mmol/L 130  129  129   Potassium 3.5 - 5.1 mmol/L 3.6  3.5  3.9    Chloride 98 - 111 mmol/L 96  99  97   CO2 22 - 32 mmol/L '25  23  23   '$ Calcium 8.9 - 10.3 mg/dL 9.3  9.0  9.2   Total Protein 6.5 - 8.1 g/dL  6.2    Total Bilirubin 0.3 - 1.2 mg/dL  0.5    Alkaline Phos 38 - 126 U/L  44    AST 15 - 41 U/L  31    ALT 0 - 44 U/L  29        Latest Ref Rng & Units 02/18/2022    6:27 AM 02/17/2022    6:07 AM 02/17/2022    2:57 AM  CBC  WBC 4.0 - 10.5 K/uL 11.1  11.4  13.2   Hemoglobin 12.0 - 15.0 g/dL 11.6  11.9  12.6   Hematocrit 36.0 - 46.0 % 33.7  34.7  36.6   Platelets 150 - 400 K/uL 382  328  355     RADS: N/a Assessment:   S/p aparoscopic repair of incisional hernia for incarcerated bowel with SBO  Doing well.  Advance to full liquids.  Hopefully home in next day or two if tolerating diet from surgery standpoint.    labs/images/medications/previous chart entries reviewed personally and relevant changes/updates noted above.

## 2022-02-18 NOTE — Progress Notes (Signed)
PROGRESS NOTE  [name] [name]:[id number] DOB: 1941/03/31 DOA: 02/12/2022 PCP: Idelle Crouch, MD  Brief History   HPI on admission: "[name] [name] is a 81 y.o. female with medical history significant of HTN, HLD, stroke (patient is not aware of this diagnosis),hypothyroidism, left breast cancer (s/p for lumpectomy and radiation therapy), skin cancer, diverticulosis, post herpetic neuralgia, gallstone, possible esophagitis, who presents with syncope and abdominal pain.   Patient states that she has right upper quadrant abdominal pain for several months.  She was diagnosed as gallstone.  Right upper quadrant ultrasound on 01/18/2022 showed tiny gallstones without evidence of cholecystitis.  Patient was seen by Dr. Dahlia Byes of surgery on 7/24, planning to do surgery, however patient was found to have hyponatremia, therefore surgery has not been scheduled yet.  Patient states that her abdominal pain has worsened the last night. It is located in the right lower quadrant and epigastric area, constant, aching and burning-like pain, 8 out of 10 in severity, radiating to the middle back.  Associated with nausea, but no vomiting, diarrhea or abdominal pain.  No fever or chills.  Patient does not have chest pain, cough, shortness breath.  Denies symptoms of UTI. Of note, pt is started on Diflucan on 8/3 for possible esophagitis by GI."   She had a brief syncopal episode in the ED, found to be bradycardic, but not hypotensive.  Metoprolol held.  HR subsequently stable   Labs notable for hyponatremia with Na 126.  UA was concerning for infection, pt started on empiric antibiotics pending cultures.  CT head negative.     Admitted to the hospital and started on IV fluids. Nephrology consulted after Na fell to 122 after initial fluids were given.  General surgery consulted given plan for cholecystectomy.  The patient underwent a robotic assisted laparoscopic cholecystectomy on 02/14/2022 with Dr. Dahlia Byes. She has  tolerated the procedure well.  The patient's sodium dropped to 127 following surgery, but has recovered to 129 for the past 2 days.  Overnight the patient developed a port hernia with bowel incarceration. She was taken back to the OR this morning for laparoscopic repair. The patient was found to have contusion of bowel without perforation or necrosis. She has tolerated the procedure well. Surgery placed her on a clear liquid diet following surgery. Her diet will be further advanced today.  Consultants  Nephrology General Surgery  Procedures  robotic assisted laparoscopic cholecystectomy   Antibiotics   Anti-infectives (From admission, onward)    Start     Dose/Rate Route Frequency Ordered Stop   02/17/22 1015  sodium chloride 0.9 % with cefoTEtan (CEFOTAN) ADS Med       Note to Pharmacy: Trudie Reed S: cabinet override      02/17/22 1015 02/17/22 1018   02/17/22 0830  cefoTEtan (CEFOTAN) 2 g in sodium chloride 0.9 % 100 mL IVPB        2 g 200 mL/hr over 30 Minutes Intravenous On call to O.R. 02/17/22 0735 02/17/22 1020   02/12/22 1000  fluconazole (DIFLUCAN) tablet 100 mg        100 mg Oral Daily 02/12/22 0816     02/12/22 0830  cefTRIAXone (ROCEPHIN) 1 g in sodium chloride 0.9 % 100 mL IVPB  Status:  Discontinued        1 g 200 mL/hr over 30 Minutes Intravenous Every 24 hours 02/12/22 0818 02/16/22 1449      Subjective  The patient is complaining of bilateral "aching" in her legs  since admission.  Objective   Vitals:  Vitals:   02/18/22 0414 02/18/22 0723  BP: (!) 146/85 (!) 162/85  Pulse: 90 90  Resp: 20 16  Temp: 97.6 F (36.4 C)   SpO2: 99% 100%    Exam:  Constitutional:  The patient is awake and alert. No acute distress. Respiratory:  No increased work of breathing. No wheezes, rales, or rhonchi No tactile fremitus Cardiovascular:  Regular rate and rhythm No murmurs, ectopy, or gallups. No lateral PMI. No thrills. Abdomen:  Abdomen is soft, slightly  tender, non-distended No hernias, masses, or organomegaly Hypoactive bowel sounds.  Musculoskeletal:  No cyanosis, clubbing, or edema Skin:  No rashes, lesions, ulcers palpation of skin: no induration or nodules Neurologic:  CN 2-12 intact Sensation all 4 extremities intact Psychiatric:  Mental status Mood, affect appropriate Orientation to person, place, time  judgment and insight appear intact   I have personally reviewed the following:   Today's Data  Vitals  Lab Data  CBC BMP  Micro Data  Urine culture 02/12/2022 has had poly microbial growth.   Imaging  CT chest abdomen pelvis CT head  Cardiology Data  EKG Echocardiogram  Other Data    Scheduled Meds:  acetaminophen  1,000 mg Oral Q6H   diclofenac Sodium  4 g Topical QID   enoxaparin (LOVENOX) injection  40 mg Subcutaneous Q24H   fluconazole  100 mg Oral Daily   pantoprazole  40 mg Oral Daily   pregabalin  25 mg Oral BID   sodium chloride  1 g Oral BID WC   sucralfate  1 g Oral QID    Principal Problem:   Hyponatremia Active Problems:   Syncope   Cholelithiasis   Abdominal pain   History of CVA (cerebrovascular accident)   Hypertension   HLD (hyperlipidemia)   UTI (urinary tract infection)   Hypothyroidism, adult   Esophagitis   Hypokalemia   Port-site hernia   Restless leg syndrome   LOS: 6 days   A & P  Assessment and Plan: * Hyponatremia Na 126 on admission.  Hypotonic.  On exam pt appears mildly hypovolemic.  Suspect due to poor oral intake and dehydration.  Pt reports drinking about three 12 oz glasses of water daily, sometimes less.  Other differential diagnosis includes SIADH.  TSH normal, free T4 mildly elevated. After initial fluids given, Na repeat was 122. Nephrology was consulted and recommended fluid restriction and salt tablets, suspecting SIADH.   The patient was initially continued on IV fluids with fluid restriction and salt tablets given. Her sodium improved to 133. The  fluids were then stopped on 02/13/2022. Na dropped to 130 on the morning of 02/14/2022. Then they dropped to 125 on the morning of 02/15/2022. Nephrology gave one dose of tolvaptan. Na is stable at 130 after being at 129 for the previous 2 days.    Syncope Etiology is not clear, suspect bradycardic episode or vasovagal syncope, orthostatic, TIA/stroke, cardiac arrhythmia, ACS (less likely, given no chest pain and normal trop) MRI brain negative. Likely related to cholecystitis.  Echo with normal EF, mild MR. -- Orthostatic vital signs  -- Neuro checks  --PT/OT --Telemetry  Cholelithiasis RUQ ultrasound on 01/18/2022 showed tiny gallstone, without evidence of cholecystitis.  On admission, no fever or leukocytosis.  Lipase normal 35, liver function normal. --General surgery following --Robotic assisted cholecystectomy on 02/14/2022.  --Supportive care - antiemetics, pain control PRN    Abdominal pain Likely due to gallstone/cholecystitis.   CTA negative for  dissection. --General surgery following --S/P Robotic assisted cholecystectomy on 02/14/2022.    History of CVA (cerebrovascular accident) Stroke is listed in medical problem list, but the patient is not aware of having prior stroke.   Not taking aspirin or pravastatin currently. MRI brain no acute findings, mild small vessel ischemic diesase. --Hold off aspirin due to possible esophagitis    Hypertension Patient reports not taking Cozaar. BP's mildly elevated --IV hydralazine as needed for now  HLD (hyperlipidemia) Patient is not taking pravastatin -Follow-up with PCP  UTI (urinary tract infection) Empiric IV Rocephin Urine culture has had polymicrobial growth.  Hypothyroidism, adult Patient is not taking medications currently TSH normal. Free T4 mildly elevated. PCP follow up.  Restless leg syndrome The patient has complained of continual discomfort in her legs bilaterally since admission. Nothing seems to help  including morphine. I believe that this may be restless leg syndrome. I have started the patient on low dose lyrica to see if it will help.  Port-site hernia Overnight on 8/17-8/18 the patient developed a port hernia with bowel incarceration. She was taken back to the OR this morning for laparoscopic repair. The patient was found to have contusion of bowel without perforation or necrosis. She has tolerated the procedure well. Surgery  placed her on a clear liquid diet following surgery. She has tolerated that well and will be advanced today.  Hypokalemia K 3.6 today. Monitor and supplement as necessary. Monitor BMP, replace K PRN.  Esophagitis - Continue Diflucan -Continue Protonix and Carafate   I have seen and examined this patient myself. I have spent 35 minutes in her evaluation and care.  DVT prophylaxis: Lovenox Code Status: Partial Code Family Communication: Family at bedside Disposition Plan: home    Ringo Sherod, DO [facility] Direct contact: see [email or web address]  7PM-7AM contact night coverage as above 02/18/2022, 1:54 PM  LOS: 3 days

## 2022-02-18 NOTE — Plan of Care (Signed)
  Problem: Activity: Goal: Risk for activity intolerance will decrease Outcome: Progressing   Problem: Nutrition: Goal: Adequate nutrition will be maintained Outcome: Progressing   Problem: Coping: Goal: Level of anxiety will decrease Outcome: Progressing   

## 2022-02-18 NOTE — Plan of Care (Signed)

## 2022-02-18 NOTE — Progress Notes (Signed)
Mobility Specialist - Progress Note    02/18/22 1206  Mobility  Activity Ambulated with assistance in room  Level of Assistance Minimal assist, patient does 75% or more  Assistive Device Front wheel walker  Distance Ambulated (ft) 20 ft  Activity Response Tolerated well  $Mobility charge 1 Mobility   Pt supine upon entry, utilizing RA. Pt transferred to EOB and Stood with MinA. Pt ambulated to the door and back to bed using RW. Pt left supine with needs in reach. Family present at bedside. No complaints.   Candie Mile Mobility Specialist 02/18/22 12:08 PM

## 2022-02-18 NOTE — Assessment & Plan Note (Signed)
The patient has complained of continual discomfort in her legs bilaterally since admission. Nothing seems to help including morphine. I believe that this may be restless leg syndrome. I started the patient on low dose lyrica. She said that it improved a little. Lyrica will be increased.

## 2022-02-19 DIAGNOSIS — E871 Hypo-osmolality and hyponatremia: Secondary | ICD-10-CM | POA: Diagnosis not present

## 2022-02-19 LAB — CBC WITH DIFFERENTIAL/PLATELET
Abs Immature Granulocytes: 0.09 10*3/uL — ABNORMAL HIGH (ref 0.00–0.07)
Basophils Absolute: 0 10*3/uL (ref 0.0–0.1)
Basophils Relative: 0 %
Eosinophils Absolute: 0.1 10*3/uL (ref 0.0–0.5)
Eosinophils Relative: 1 %
HCT: 31.5 % — ABNORMAL LOW (ref 36.0–46.0)
Hemoglobin: 10.9 g/dL — ABNORMAL LOW (ref 12.0–15.0)
Immature Granulocytes: 1 %
Lymphocytes Relative: 15 %
Lymphs Abs: 1.3 10*3/uL (ref 0.7–4.0)
MCH: 29.7 pg (ref 26.0–34.0)
MCHC: 34.6 g/dL (ref 30.0–36.0)
MCV: 85.8 fL (ref 80.0–100.0)
Monocytes Absolute: 0.6 10*3/uL (ref 0.1–1.0)
Monocytes Relative: 7 %
Neutro Abs: 6.2 10*3/uL (ref 1.7–7.7)
Neutrophils Relative %: 76 %
Platelets: 324 10*3/uL (ref 150–400)
RBC: 3.67 MIL/uL — ABNORMAL LOW (ref 3.87–5.11)
RDW: 13.9 % (ref 11.5–15.5)
WBC: 8.2 10*3/uL (ref 4.0–10.5)
nRBC: 0 % (ref 0.0–0.2)

## 2022-02-19 LAB — BASIC METABOLIC PANEL
Anion gap: 6 (ref 5–15)
BUN: 10 mg/dL (ref 8–23)
CO2: 27 mmol/L (ref 22–32)
Calcium: 8.8 mg/dL — ABNORMAL LOW (ref 8.9–10.3)
Chloride: 96 mmol/L — ABNORMAL LOW (ref 98–111)
Creatinine, Ser: 0.65 mg/dL (ref 0.44–1.00)
GFR, Estimated: >60 mL/min (ref >60)
Glucose, Bld: 107 mg/dL — ABNORMAL HIGH (ref 70–99)
Potassium: 3.5 mmol/L (ref 3.5–5.1)
Sodium: 129 mmol/L — ABNORMAL LOW (ref 135–145)

## 2022-02-19 MED ORDER — PREGABALIN 50 MG PO CAPS
50.0000 mg | ORAL_CAPSULE | Freq: Two times a day (BID) | ORAL | Status: DC
  Administered 2022-02-19 – 2022-02-21 (×4): 50 mg via ORAL
  Filled 2022-02-19 (×4): qty 1

## 2022-02-19 MED ORDER — MELATONIN 5 MG PO TABS
5.0000 mg | ORAL_TABLET | Freq: Once | ORAL | Status: AC
  Administered 2022-02-19: 5 mg via ORAL
  Filled 2022-02-19: qty 1

## 2022-02-19 MED ORDER — SODIUM CHLORIDE 1 G PO TABS
1.0000 g | ORAL_TABLET | Freq: Three times a day (TID) | ORAL | Status: DC
  Administered 2022-02-19 – 2022-02-21 (×6): 1 g via ORAL
  Filled 2022-02-19 (×7): qty 1

## 2022-02-19 NOTE — Plan of Care (Signed)
  Problem: Education: Goal: Knowledge of General Education information will improve Description: Including pain rating scale, medication(s)/side effects and non-pharmacologic comfort measures Outcome: Progressing   Problem: Health Behavior/Discharge Planning: Goal: Ability to manage health-related needs will improve Outcome: Progressing   Problem: Clinical Measurements: Goal: Ability to maintain clinical measurements within normal limits will improve Outcome: Progressing   Problem: Clinical Measurements: Goal: Will remain free from infection Outcome: Progressing   Problem: Clinical Measurements: Goal: Diagnostic test results will improve Outcome: Progressing   Problem: Clinical Measurements: Goal: Respiratory complications will improve Outcome: Progressing   Problem: Clinical Measurements: Goal: Cardiovascular complication will be avoided Outcome: Progressing   Problem: Activity: Goal: Risk for activity intolerance will decrease Outcome: Progressing   Problem: Nutrition: Goal: Adequate nutrition will be maintained Outcome: Progressing   Problem: Coping: Goal: Level of anxiety will decrease Outcome: Progressing   Problem: Elimination: Goal: Will not experience complications related to bowel motility Outcome: Progressing   Problem: Elimination: Goal: Will not experience complications related to urinary retention Outcome: Progressing   Problem: Pain Managment: Goal: General experience of comfort will improve Outcome: Progressing   Problem: Safety: Goal: Ability to remain free from injury will improve Outcome: Progressing   Problem: Skin Integrity: Goal: Risk for impaired skin integrity will decrease Outcome: Progressing   

## 2022-02-19 NOTE — Progress Notes (Signed)
OT Cancellation Note  Patient Details Name: [name] [name] MRN: [id number] DOB: 19-Apr-1941   Cancelled Treatment:    Reason Eval/Treat Not Completed: Other (comment) Upon arrival, pt asleep in bed. Family requesting OT come back later. OT to f/u as available.  Doneta Public 02/19/2022, 2:33 PM

## 2022-02-19 NOTE — Progress Notes (Signed)
Occupational Therapy Treatment Patient Details Name: [name] [name] MRN: [id number] DOB: 08-14-1940 Today's Date: 02/19/2022   History of present illness Pt is 29 YOF admitted for syncope, hyponatremia, UTI, gallstones, and abdominal pain. PMH includes: HTN, HLD, CVA, hypothyroidism, breast CA, skin CA, diverticulosis, post herpetic neualgia, and esophagitis.   OT comments  Upon entering session, pt endorsing wanting to walk in hallway. Pt completed bed mobility with Min A, STS and functional mobility with Min guard using a RW. Pt unable to make it to nurse's station before requiring a seated rest break. Pt reporting her BLEs "feel so weak". Pt returned to room to complete toileting in bathroom. Pt completed toilet transfer with Min guard and peri care with supervision in sitting. Pt then returned to supine in bed with Min A. Pt is making progress toward goal completion. D/C recommendation remains appropriate. OT will continue to follow acutely.    Recommendations for follow up therapy are one component of a multi-disciplinary discharge planning process, led by the attending physician.  Recommendations may be updated based on patient status, additional functional criteria and insurance authorization.    Follow Up Recommendations  No OT follow up    Assistance Recommended at Discharge Set up Supervision/Assistance  Patient can return home with the following  Help with stairs or ramp for entrance;A little help with bathing/dressing/bathroom;A little help with walking and/or transfers;Assistance with cooking/housework;Assist for transportation   Equipment Recommendations  None recommended by OT    Recommendations for Other Services      Precautions / Restrictions Precautions Precautions: Fall Restrictions Weight Bearing Restrictions: No       Mobility Bed Mobility Overal bed mobility: Needs Assistance Bed Mobility: Supine to Sit, Sit to Supine     Supine to sit: Min assist, HOB  elevated Sit to supine: Min assist        Transfers Overall transfer level: Needs assistance Equipment used: Rolling walker (2 wheels) Transfers: Sit to/from Stand Sit to Stand: Min guard           General transfer comment: STS from EOB and toilet with Min guard     Balance Overall balance assessment: Modified Independent Sitting-balance support: Feet supported Sitting balance-Leahy Scale: Good Sitting balance - Comments: able to perform hygiene while sitting on toilet without LOB   Standing balance support: Bilateral upper extremity supported, During functional activity, Reliant on assistive device for balance Standing balance-Leahy Scale: Good Standing balance comment: No LOB with functional mobility                           ADL either performed or assessed with clinical judgement   ADL Overall ADL's : Needs assistance/impaired                         Toilet Transfer: Rolling walker (2 wheels);Grab bars;Min guard Toilet Transfer Details (indicate cue type and reason): Pt completed toilet transfer with Min guard + RW and L grab bar. Toileting- Clothing Manipulation and Hygiene: Supervision/safety;Sitting/lateral lean Toileting - Clothing Manipulation Details (indicate cue type and reason): Pt completed peri care in sitting with supervision.     Functional mobility during ADLs: Rolling walker (2 wheels);Min guard      Extremity/Trunk Assessment Upper Extremity Assessment Upper Extremity Assessment: Generalized weakness   Lower Extremity Assessment Lower Extremity Assessment: Generalized weakness   Cervical / Trunk Assessment Cervical / Trunk Assessment: Normal    Vision Baseline Vision/History: 1  Wears glasses Patient Visual Report: No change from baseline     Perception     Praxis      Cognition Arousal/Alertness: Awake/alert Behavior During Therapy: WFL for tasks assessed/performed Overall Cognitive Status: Within Functional  Limits for tasks assessed                                          Exercises      Shoulder Instructions       General Comments      Pertinent Vitals/ Pain       Pain Assessment Pain Assessment: No/denies pain  Home Living                                          Prior Functioning/Environment              Frequency  Min 2X/week        Progress Toward Goals  OT Goals(current goals can now be found in the care plan section)  Progress towards OT goals: Progressing toward goals  Acute Rehab OT Goals Patient Stated Goal: return home OT Goal Formulation: With patient/family Time For Goal Achievement: 02/27/22 Potential to Achieve Goals: Good  Plan Discharge plan remains appropriate;Frequency remains appropriate    Co-evaluation                 AM-PAC OT "6 Clicks" Daily Activity     Outcome Measure   Help from another person eating meals?: None Help from another person taking care of personal grooming?: A Little Help from another person toileting, which includes using toliet, bedpan, or urinal?: A Little Help from another person bathing (including washing, rinsing, drying)?: A Little Help from another person to put on and taking off regular upper body clothing?: A Little Help from another person to put on and taking off regular lower body clothing?: A Little 6 Click Score: 19    End of Session Equipment Utilized During Treatment: Gait belt;Rolling walker (2 wheels)  OT Visit Diagnosis: Unsteadiness on feet (R26.81);Muscle weakness (generalized) (M62.81);Pain   Activity Tolerance Patient tolerated treatment well;Patient limited by fatigue   Patient Left in bed;with call bell/phone within reach;with bed alarm set;with family/visitor present   Nurse Communication Mobility status        Time: 3536-1443 OT Time Calculation (min): 16 min  Charges: OT General Charges $OT Visit: 1 Visit OT Treatments $Self  Care/Home Management : 8-22 mins  [facility] MS, OTR/L ascom [phone]  02/19/22, 4:33 PM

## 2022-02-19 NOTE — Progress Notes (Signed)
Physical Therapy Treatment Patient Details Name: [name] [name] MRN: [id number] DOB: May 21, 1941 Today's Date: 02/19/2022   History of Present Illness Pt is 29 YOF admitted for syncope, hyponatremia, UTI, gallstones, and abdominal pain. PMH includes: HTN, HLD, CVA, hypothyroidism, breast CA, skin CA, diverticulosis, post herpetic neualgia, and esophagitis.    PT Comments    Pt received in bed agreeable to participate in skilled PT interventions. Pt received continue orders upon transfer so PT to continue treatment after Sx.  Pt has pain in BLE which improved with ambulation and exs. Pt performed AROM to BLE as charted and PT advised pt to do the same exercises every 2 hours to promote pain relief and strength.  Pt ambulated with RW around the Nurses unit without LOB. Pt tired after ambulation but pain level improved from 7/10 to 5/10. Pt's air-compression on. Pt left in chair with a visitor in the room. Pt will benefit form HHPT after acute care.   Recommendations for follow up therapy are one component of a multi-disciplinary discharge planning process, led by the attending physician.  Recommendations may be updated based on patient status, additional functional criteria and insurance authorization.  Follow Up Recommendations  Home health PT     Assistance Recommended at Discharge Intermittent Supervision/Assistance  Patient can return home with the following A little help with walking and/or transfers;A little help with bathing/dressing/bathroom;Assistance with cooking/housework;Assistance with feeding;Direct supervision/assist for medications management;Direct supervision/assist for financial management;Assist for transportation;Help with stairs or ramp for entrance   Equipment Recommendations  Rolling walker (2 wheels)    Recommendations for Other Services       Precautions / Restrictions Precautions Precautions: Fall Restrictions Weight Bearing Restrictions: No     Mobility   Bed Mobility Overal bed mobility: Needs Assistance Bed Mobility: Supine to Sit, Sit to Supine     Supine to sit: Supervision     General bed mobility comments: slow    Transfers Overall transfer level: Needs assistance Equipment used: Rolling walker (2 wheels) Transfers: Sit to/from Stand, Bed to chair/wheelchair/BSC Sit to Stand: Supervision   Step pivot transfers: Min guard            Ambulation/Gait Ambulation/Gait assistance: Min guard Gait Distance (Feet): 200 Feet Assistive device: Rolling walker (2 wheels) Gait Pattern/deviations: Step-through pattern, Decreased stride length Gait velocity: decr     General Gait Details: showes consistent improvement.   Stairs             Wheelchair Mobility    Modified Rankin (Stroke Patients Only)       Balance Overall balance assessment: Modified Independent   Sitting balance-Leahy Scale: Good     Standing balance support: Bilateral upper extremity supported, During functional activity, Reliant on assistive device for balance Standing balance-Leahy Scale: Good Standing balance comment: No LOB with gait                            Cognition Arousal/Alertness: Awake/alert Behavior During Therapy: WFL for tasks assessed/performed Overall Cognitive Status: Within Functional Limits for tasks assessed Area of Impairment: Orientation, Following commands, Safety/judgement, Awareness                       Following Commands: Follows multi-step commands consistently                Exercises General Exercises - Lower Extremity Ankle Circles/Pumps: AROM, Both, 15 reps Long Arc Quad: AROM, Both, 10 reps Heel  Slides: AROM, 20 reps, Seated Straight Leg Raises: AROM, 20 reps, Seated    General Comments        Pertinent Vitals/Pain Pain Assessment Pain Assessment: 0-10 Pain Score: 7  Pain Location: BLE Pain Descriptors / Indicators: Aching Pain Intervention(s):  (RN notified)     Home Living                          Prior Function            PT Goals (current goals can now be found in the care plan section) Acute Rehab PT Goals Patient Stated Goal: to get back to cooking PT Goal Formulation: With patient Time For Goal Achievement: 02/26/22 Potential to Achieve Goals: Good Progress towards PT goals: Progressing toward goals    Frequency    Min 2X/week      PT Plan Current plan remains appropriate    Co-evaluation              AM-PAC PT "6 Clicks" Mobility   Outcome Measure  Help needed turning from your back to your side while in a flat bed without using bedrails?: None Help needed moving from lying on your back to sitting on the side of a flat bed without using bedrails?: None Help needed moving to and from a bed to a chair (including a wheelchair)?: A Little Help needed standing up from a chair using your arms (e.g., wheelchair or bedside chair)?: A Little Help needed to walk in hospital room?: A Little Help needed climbing 3-5 steps with a railing? : A Lot 6 Click Score: 19    End of Session Equipment Utilized During Treatment: Gait belt Activity Tolerance: Patient tolerated treatment well Patient left: in chair;with call bell/phone within reach;with family/visitor present Nurse Communication: Mobility status PT Visit Diagnosis: Other abnormalities of gait and mobility (R26.89);Muscle weakness (generalized) (M62.81)     Time: 2229-7989 PT Time Calculation (min) (ACUTE ONLY): 25 min  Charges:  $Gait Training: 8-22 mins $Therapeutic Exercise: 8-22 mins                    Joaquin Music PT DPT 12:09 PM,02/19/22

## 2022-02-19 NOTE — Progress Notes (Signed)
PROGRESS NOTE  [name] [name] VZC:[id number] DOB: Mar 13, 1941 DOA: 02/12/2022 PCP: Idelle Crouch, MD  Brief History   HPI on admission: "[name] [name] is a 81 y.o. female with medical history significant of HTN, HLD, stroke (patient is not aware of this diagnosis),hypothyroidism, left breast cancer (s/p for lumpectomy and radiation therapy), skin cancer, diverticulosis, post herpetic neuralgia, gallstone, possible esophagitis, who presents with syncope and abdominal pain.   Patient states that she has right upper quadrant abdominal pain for several months.  She was diagnosed as gallstone.  Right upper quadrant ultrasound on 01/18/2022 showed tiny gallstones without evidence of cholecystitis.  Patient was seen by Dr. Dahlia Byes of surgery on 7/24, planning to do surgery, however patient was found to have hyponatremia, therefore surgery has not been scheduled yet.  Patient states that her abdominal pain has worsened the last night. It is located in the right lower quadrant and epigastric area, constant, aching and burning-like pain, 8 out of 10 in severity, radiating to the middle back.  Associated with nausea, but no vomiting, diarrhea or abdominal pain.  No fever or chills.  Patient does not have chest pain, cough, shortness breath.  Denies symptoms of UTI. Of note, pt is started on Diflucan on 8/3 for possible esophagitis by GI."   She had a brief syncopal episode in the ED, found to be bradycardic, but not hypotensive.  Metoprolol held.  HR subsequently stable   Labs notable for hyponatremia with Na 126.  UA was concerning for infection, pt started on empiric antibiotics pending cultures.  CT head negative.     Admitted to the hospital and started on IV fluids. Nephrology consulted after Na fell to 122 after initial fluids were given.  General surgery consulted given plan for cholecystectomy.  The patient underwent a robotic assisted laparoscopic cholecystectomy on 02/14/2022 with Dr. Dahlia Byes. She has  tolerated the procedure well.  The patient's sodium dropped to 127 following surgery, but has recovered to 129 for the past 2 days.  Overnight the patient developed a port hernia with bowel incarceration. She was taken back to the OR this morning for laparoscopic repair. The patient was found to have contusion of bowel without perforation or necrosis. She has tolerated the procedure well. Surgery placed her on a clear liquid diet following surgery. She is tolerating a regular diet now. Consultants  Nephrology General Surgery  Procedures  robotic assisted laparoscopic cholecystectomy   Antibiotics   Anti-infectives (From admission, onward)    Start     Dose/Rate Route Frequency Ordered Stop   02/17/22 1015  sodium chloride 0.9 % with cefoTEtan (CEFOTAN) ADS Med       Note to Pharmacy: Trudie Reed S: cabinet override      02/17/22 1015 02/17/22 1018   02/17/22 0830  cefoTEtan (CEFOTAN) 2 g in sodium chloride 0.9 % 100 mL IVPB        2 g 200 mL/hr over 30 Minutes Intravenous On call to O.R. 02/17/22 0735 02/17/22 1020   02/12/22 1000  fluconazole (DIFLUCAN) tablet 100 mg        100 mg Oral Daily 02/12/22 0816     02/12/22 0830  cefTRIAXone (ROCEPHIN) 1 g in sodium chloride 0.9 % 100 mL IVPB  Status:  Discontinued        1 g 200 mL/hr over 30 Minutes Intravenous Every 24 hours 02/12/22 0818 02/16/22 1449      Subjective  The patient is complaining of bilateral "aching" in her legs since  admission.  Objective   Vitals:  Vitals:   02/19/22 0805 02/19/22 1520  BP: (!) 143/87 (!) 140/69  Pulse: 92 94  Resp: 16 17  Temp: 97.6 F (36.4 C) 97.6 F (36.4 C)  SpO2: 98% 99%    Exam:  Constitutional:  The patient is awake and alert. No acute distress. Respiratory:  No increased work of breathing. No wheezes, rales, or rhonchi No tactile fremitus Cardiovascular:  Regular rate and rhythm No murmurs, ectopy, or gallups. No lateral PMI. No thrills. Abdomen:  Abdomen is soft,  slightly tender, non-distended No hernias, masses, or organomegaly Hypoactive bowel sounds.  Musculoskeletal:  No cyanosis, clubbing, or edema Skin:  No rashes, lesions, ulcers palpation of skin: no induration or nodules Neurologic:  CN 2-12 intact Sensation all 4 extremities intact Psychiatric:  Mental status Mood, affect appropriate Orientation to person, place, time  judgment and insight appear intact   I have personally reviewed the following:   Today's Data  Vitals  Lab Data  CBC BMP  Micro Data  Urine culture 02/12/2022 has had poly microbial growth.   Imaging  CT chest abdomen pelvis CT head  Cardiology Data  EKG Echocardiogram  Other Data    Scheduled Meds:  acetaminophen  1,000 mg Oral Q6H   diclofenac Sodium  4 g Topical QID   enoxaparin (LOVENOX) injection  40 mg Subcutaneous Q24H   fluconazole  100 mg Oral Daily   pantoprazole  40 mg Oral Daily   pregabalin  50 mg Oral BID   sodium chloride  1 g Oral TID WC   sucralfate  1 g Oral QID    Principal Problem:   Hyponatremia Active Problems:   Syncope   Cholelithiasis   Abdominal pain   History of CVA (cerebrovascular accident)   Hypertension   HLD (hyperlipidemia)   UTI (urinary tract infection)   Hypothyroidism, adult   Esophagitis   Hypokalemia   Port-site hernia   Restless leg syndrome   LOS: 7 days   A & P  Assessment and Plan: * Hyponatremia Na 126 on admission.  Hypotonic.  On exam pt appears mildly hypovolemic.  Suspect due to poor oral intake and dehydration.  Pt reports drinking about three 12 oz glasses of water daily, sometimes less.  Other differential diagnosis includes SIADH.  TSH normal, free T4 mildly elevated. After initial fluids given, Na repeat was 122. Nephrology was consulted and recommended fluid restriction and salt tablets, suspecting SIADH.   The patient was initially continued on IV fluids with fluid restriction and salt tablets given. Her sodium improved to  133. The fluids were then stopped on 02/13/2022. Na dropped to 130 on the morning of 02/14/2022. Then they dropped to 125 on the morning of 02/15/2022. Nephrology gave one dose of tolvaptan. Na is stable in the 129-130 range since 02/17/2022. Continue to monitor.    Syncope Etiology is not clear, suspect bradycardic episode or vasovagal syncope, orthostatic, TIA/stroke, cardiac arrhythmia, ACS (less likely, given no chest pain and normal trop) MRI brain negative. Likely related to cholecystitis.  Echo with normal EF, mild MR. -- Orthostatic vital signs  -- Neuro checks  --PT/OT --Telemetry  Cholelithiasis RUQ ultrasound on 01/18/2022 showed tiny gallstone, without evidence of cholecystitis.  On admission, no fever or leukocytosis.  Lipase normal 35, liver function normal. --General surgery following --Robotic assisted cholecystectomy on 02/14/2022.  --Supportive care - antiemetics, pain control PRN    Abdominal pain Likely due to gallstone/cholecystitis.   CTA negative for  dissection. --General surgery following --S/P Robotic assisted cholecystectomy on 02/14/2022.    History of CVA (cerebrovascular accident) Stroke is listed in medical problem list, but the patient is not aware of having prior stroke.   Not taking aspirin or pravastatin currently. MRI brain no acute findings, mild small vessel ischemic diesase. --Hold off aspirin due to possible esophagitis    Hypertension Patient reports not taking Cozaar. BP's mildly elevated --IV hydralazine as needed for now  HLD (hyperlipidemia) Patient is not taking pravastatin -Follow-up with PCP  UTI (urinary tract infection) Empiric IV Rocephin Urine culture has had polymicrobial growth. IV antibiotics have been stopped.  Hypothyroidism, adult Patient is not taking medications currently TSH normal. Free T4 mildly elevated. PCP follow up.  Restless leg syndrome The patient has complained of continual discomfort in her legs  bilaterally since admission. Nothing seems to help including morphine. I believe that this may be restless leg syndrome. I started the patient on low dose lyrica. She said that it improved a little. Lyrica will be increased.  Port-site hernia Overnight on 8/17-8/18 the patient developed a port hernia with bowel incarceration. She was taken back to the OR this morning for laparoscopic repair. The patient was found to have contusion of bowel without perforation or necrosis. She has tolerated the procedure well. Surgery  placed her on a clear liquid diet following surgery. She is now tolerating a regular diet.  Hypokalemia K 3.5 today. Monitor and supplement as necessary. Monitor BMP, replace K PRN.  Esophagitis - Continue Diflucan -Continue Protonix and Carafate   I have seen and examined this patient myself. I have spent 35 minutes in her evaluation and care.  DVT prophylaxis: Lovenox Code Status: Partial Code Family Communication: Family at bedside Disposition Plan: home    [name] Stokke, DO [facility] Direct contact: see [email or web address]  7PM-7AM contact night coverage as above 02/19/2022, 3:58 PM  LOS: 3 days

## 2022-02-19 NOTE — Progress Notes (Signed)
       CROSS COVER NOTE  NAME: TAMISHA NORDSTROM MRN: [id number] DOB : [birth year]    Date of Service   02/19/22  HPI/Events of Note   Medication request received for sleep aid  Interventions   Plan: Melatonin x1    This document was prepared using Dragon voice recognition software and may include unintentional dictation errors.  Neomia Glass DNP, MHA, FNP-BC Nurse Practitioner [facility] Pager [phone]

## 2022-02-19 NOTE — Progress Notes (Signed)
Mobility Specialist - Progress Note     02/19/22 1032  Mobility  Activity Ambulated independently to bathroom (ambulated from bathroom to bed)  Level of Assistance Modified independent, requires aide device or extra time  Assistive Device Front wheel walker  Distance Ambulated (ft) 10 ft  Activity Response Tolerated well  $Mobility charge 1 Mobility   Pt sitting in chair upon entry, utilizing RA. Pt ambulated from the chair, to the bathroom and to bed independently. Pt left supine with alarm set and needs in reach. No complaints.   Candie [place] Mobility Specialist 02/19/22 10:35 AM

## 2022-02-19 NOTE — Progress Notes (Signed)
Subjective:  CC: [name] [name] is a 81 y.o. female  Hospital stay day 7, 2 Days Post-Op laparoscopic repair of incisional hernia for incarcerated bowel with SBO  HPI: No acute issues overnight.  Tolerating full liquid  ROS:  General: Denies weight loss, weight gain, fatigue, fevers, chills, and night sweats. Heart: Denies chest pain, palpitations, racing heart, irregular heartbeat, leg pain or swelling, and decreased activity tolerance. Respiratory: Denies breathing difficulty, shortness of breath, wheezing, cough, and sputum. GI: Denies change in appetite, heartburn, nausea, vomiting, constipation, diarrhea, and blood in stool. GU: Denies difficulty urinating, pain with urinating, urgency, frequency, blood in urine.   Objective:   Temp:  [97.6 F (36.4 C)-98.2 F (36.8 C)] 97.6 F (36.4 C) (08/20 1520) Pulse Rate:  [78-94] 94 (08/20 1520) Resp:  [16-18] 17 (08/20 1520) BP: (140-170)/(69-92) 140/69 (08/20 1520) SpO2:  [97 %-99 %] 99 % (08/20 1520)     Height: 5' (152.4 cm) Weight: 49 kg BMI (Calculated): 21.1   Intake/Output this shift:   Intake/Output Summary (Last 24 hours) at 02/19/2022 1621 Last data filed at 02/19/2022 1348 Gross per 24 hour  Intake 480 ml  Output 900 ml  Net -420 ml    Constitutional :  alert, cooperative, appears stated age, and no distress  Respiratory:  clear to auscultation bilaterally  Cardiovascular:  regular rate and rhythm  Gastrointestinal: soft, non-tender; bowel sounds normal; no masses,  no organomegaly.   Skin: Cool and moist. Incisions c/d/i  Psychiatric: Normal affect, non-agitated, not confused       LABS:     Latest Ref Rng & Units 02/19/2022    6:29 AM 02/18/2022    6:27 AM 02/17/2022    6:07 AM  CMP  Glucose 70 - 99 mg/dL 107  110  117   BUN 8 - 23 mg/dL '10  11  19   '$ Creatinine 0.44 - 1.00 mg/dL 0.65  0.59  0.54   Sodium 135 - 145 mmol/L 129  130  129   Potassium 3.5 - 5.1 mmol/L 3.5  3.6  3.5   Chloride 98 - 111 mmol/L 96   96  99   CO2 22 - 32 mmol/L '27  25  23   '$ Calcium 8.9 - 10.3 mg/dL 8.8  9.3  9.0   Total Protein 6.5 - 8.1 g/dL   6.2   Total Bilirubin 0.3 - 1.2 mg/dL   0.5   Alkaline Phos 38 - 126 U/L   44   AST 15 - 41 U/L   31   ALT 0 - 44 U/L   29       Latest Ref Rng & Units 02/19/2022    6:29 AM 02/18/2022    6:27 AM 02/17/2022    6:07 AM  CBC  WBC 4.0 - 10.5 K/uL 8.2  11.1  11.4   Hemoglobin 12.0 - 15.0 g/dL 10.9  11.6  11.9   Hematocrit 36.0 - 46.0 % 31.5  33.7  34.7   Platelets 150 - 400 K/uL 324  382  328     RADS: N/a Assessment:   S/p aparoscopic repair of incisional hernia for incarcerated bowel with SBO  Doing well.  Regular diet now.  Further care per primary.    labs/images/medications/previous chart entries reviewed personally and relevant changes/updates noted above.

## 2022-02-19 NOTE — Progress Notes (Signed)
[facility]  ROUNDING NOTE   Subjective:   Patient seen resting in bed, No family at bedside Diet advance to solids foods, tolerating well Denies nausea and vomiting Able to ambulate independently in the room No lower extremity edema  Sodium 129  Objective:  Vital signs in last 24 hours:  Temp:  [97.6 F (36.4 C)-98.2 F (36.8 C)] 97.6 F (36.4 C) (08/20 0805) Pulse Rate:  [78-92] 92 (08/20 0805) Resp:  [16-18] 16 (08/20 0805) BP: (143-170)/(80-92) 143/87 (08/20 0805) SpO2:  [97 %-98 %] 98 % (08/20 0805)  Weight change:  Filed Weights   02/12/22 0427 02/17/22 0835  Weight: 49 kg 49 kg    Intake/Output: I/O last 3 completed shifts: In: 840 [P.O.:840] Out: 900 [Urine:900]   Intake/Output this shift:  Total I/O In: 120 [P.O.:120] Out: -   Physical Exam: General: NAD  Head: Normocephalic, atraumatic. Moist oral mucosal membranes  Eyes: Anicteric  Lungs:  Clear to auscultation, normal effort, room air  Heart: Regular rate and rhythm  Abdomen:  Soft, tender, nondistended, surgical lap sites  Extremities: No peripheral edema.  Neurologic: Nonfocal, moving all four extremities, bilateral upper extremity tremors  Skin: No lesions  Access: None    Basic Metabolic Panel: Recent Labs  Lab 02/12/22 1453 02/12/22 2002 02/15/22 0529 02/15/22 2128 02/16/22 0453 02/16/22 1306 02/17/22 0607 02/18/22 0627 02/19/22 0629  NA 125*   < > 125*   < > 129* 129* 129* 130* 129*  K 4.0   < > 4.0  --   --  3.9 3.5 3.6 3.5  CL 92*   < > 91*  --   --  97* 99 96* 96*  CO2 21*   < > 25  --   --  '23 23 25 27  '$ GLUCOSE 107*   < > 164*  --   --  151* 117* 110* 107*  BUN 6*   < > 6*  --   --  '12 19 11 10  '$ CREATININE 0.49   < > 0.48  --   --  0.59 0.54 0.59 0.65  CALCIUM 8.7*   < > 9.4  --   --  9.2 9.0 9.3 8.8*  MG 1.7  --  1.7  --   --   --  1.9  --   --    < > = values in this interval not displayed.     Liver Function Tests: Recent Labs  Lab 02/12/22 1453  02/15/22 0529 02/17/22 0607  AST 20 50* 31  ALT 16 37 29  ALKPHOS 50 49 44  BILITOT 0.6 0.5 0.5  PROT 6.4* 6.8 6.2*  ALBUMIN 3.7 4.1 3.5    No results for input(s): "LIPASE", "AMYLASE" in the last 168 hours.  No results for input(s): "AMMONIA" in the last 168 hours.  CBC: Recent Labs  Lab 02/15/22 0529 02/17/22 0257 02/17/22 0607 02/18/22 0627 02/19/22 0629  WBC 12.0* 13.2* 11.4* 11.1* 8.2  NEUTROABS  --   --  9.3* 8.9* 6.2  HGB 12.3 12.6 11.9* 11.6* 10.9*  HCT 34.4* 36.6 34.7* 33.7* 31.5*  MCV 84.7 85.9 86.5 86.6 85.8  PLT 397 355 328 382 324     Cardiac Enzymes: No results for input(s): "CKTOTAL", "CKMB", "CKMBINDEX", "TROPONINI" in the last 168 hours.  BNP: Invalid input(s): "POCBNP"  CBG: No results for input(s): "GLUCAP" in the last 168 hours.   Microbiology: Results for orders placed or performed during the hospital encounter of 02/12/22  Urine Culture     Status: Abnormal   Collection Time: 02/12/22  6:22 AM   Specimen: Urine, Clean Catch  Result Value Ref Range Status   Specimen Description   Final    URINE, CLEAN CATCH Performed at [facility], [address], [place], [place] [address]    Special Requests   Final    NONE Performed at [facility], [address] [place], [place] [address]    Culture MULTIPLE SPECIES PRESENT, SUGGEST RECOLLECTION (A)  Final   Report Status 02/13/2022 FINAL  Final    Coagulation Studies: Recent Labs    02/17/22 0607  LABPROT 13.5  INR 1.0     Urinalysis: No results for input(s): "COLORURINE", "LABSPEC", "PHURINE", "GLUCOSEU", "HGBUR", "BILIRUBINUR", "KETONESUR", "PROTEINUR", "UROBILINOGEN", "NITRITE", "LEUKOCYTESUR" in the last 72 hours.  Invalid input(s): "APPERANCEUR"     Imaging: No results found.   Medications:      acetaminophen  1,000 mg Oral Q6H   diclofenac Sodium  4 g Topical QID   enoxaparin (LOVENOX) injection  40 mg Subcutaneous Q24H   fluconazole  100 mg  Oral Daily   pantoprazole  40 mg Oral Daily   pregabalin  50 mg Oral BID   sodium chloride  1 g Oral TID WC   sucralfate  1 g Oral QID   alum & mag hydroxide-simeth, hydrALAZINE, ketorolac, morphine injection, ondansetron (ZOFRAN) IV, oxyCODONE, senna  Assessment/ Plan:  [name] is a 81 y.o.  female with a PMHx of hypertension, coronary artery disease, hyperlipidemia, history of CVA, history of breast cancer s/p radiation and hysterectomy, diverticulosis and postherpetic neuralgia who is now admitted with history of abdominal pain and a syncopal episode.  Patient has history of cholelithiasis and was supposed to go for cholecystectomy.  Surgery was postponed secondary to her hyponatremia.  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  Sodium on admission 126 with lowest point of 122. Sodium initially corrected to 133 with IV fluids and salt tablets.    Sodium appears to have stabilized between 129 and 130.  Will increase salt tablets to 3 times daily.  This should also improve with advance diet.  2.  Chronic cholecystitis.  Underwent laparoscopic cholecystectomy on 02/14/2022 with hernia repair on 02/17/2022.  Surgery following.    LOS: 7 Terren Haberle 8/20/20231:38 PM

## 2022-02-20 DIAGNOSIS — E871 Hypo-osmolality and hyponatremia: Secondary | ICD-10-CM | POA: Diagnosis not present

## 2022-02-20 LAB — CBC WITH DIFFERENTIAL/PLATELET
Abs Immature Granulocytes: 0.1 10*3/uL — ABNORMAL HIGH (ref 0.00–0.07)
Basophils Absolute: 0 10*3/uL (ref 0.0–0.1)
Basophils Relative: 0 %
Eosinophils Absolute: 0.1 10*3/uL (ref 0.0–0.5)
Eosinophils Relative: 1 %
HCT: 32.8 % — ABNORMAL LOW (ref 36.0–46.0)
Hemoglobin: 11.1 g/dL — ABNORMAL LOW (ref 12.0–15.0)
Immature Granulocytes: 2 %
Lymphocytes Relative: 20 %
Lymphs Abs: 1.3 10*3/uL (ref 0.7–4.0)
MCH: 29.3 pg (ref 26.0–34.0)
MCHC: 33.8 g/dL (ref 30.0–36.0)
MCV: 86.5 fL (ref 80.0–100.0)
Monocytes Absolute: 0.6 10*3/uL (ref 0.1–1.0)
Monocytes Relative: 9 %
Neutro Abs: 4.5 10*3/uL (ref 1.7–7.7)
Neutrophils Relative %: 68 %
Platelets: 357 10*3/uL (ref 150–400)
RBC: 3.79 MIL/uL — ABNORMAL LOW (ref 3.87–5.11)
RDW: 14 % (ref 11.5–15.5)
WBC: 6.5 10*3/uL (ref 4.0–10.5)
nRBC: 0 % (ref 0.0–0.2)

## 2022-02-20 LAB — BASIC METABOLIC PANEL
Anion gap: 9 (ref 5–15)
BUN: 12 mg/dL (ref 8–23)
CO2: 25 mmol/L (ref 22–32)
Calcium: 9.1 mg/dL (ref 8.9–10.3)
Chloride: 96 mmol/L — ABNORMAL LOW (ref 98–111)
Creatinine, Ser: 0.56 mg/dL (ref 0.44–1.00)
GFR, Estimated: >60 mL/min (ref >60)
Glucose, Bld: 108 mg/dL — ABNORMAL HIGH (ref 70–99)
Potassium: 3.4 mmol/L — ABNORMAL LOW (ref 3.5–5.1)
Sodium: 130 mmol/L — ABNORMAL LOW (ref 135–145)

## 2022-02-20 MED ORDER — POTASSIUM CHLORIDE CRYS ER 20 MEQ PO TBCR
40.0000 meq | EXTENDED_RELEASE_TABLET | Freq: Once | ORAL | Status: AC
  Administered 2022-02-20: 40 meq via ORAL

## 2022-02-20 MED ORDER — LOSARTAN POTASSIUM 25 MG PO TABS
25.0000 mg | ORAL_TABLET | Freq: Every day | ORAL | Status: DC
  Administered 2022-02-20 – 2022-02-21 (×2): 25 mg via ORAL
  Filled 2022-02-20 (×2): qty 1

## 2022-02-20 NOTE — Care Management Important Message (Signed)
Important Message  Patient Details  Name: [name] [name] MRN: [id number] Date of Birth: 05-28-1941   Medicare Important Message Given:  Yes     Juliann Pulse A Shawnette Augello 02/20/2022, 12:28 PM

## 2022-02-20 NOTE — Plan of Care (Signed)

## 2022-02-20 NOTE — Progress Notes (Signed)
PROGRESS NOTE  [name]:[id number] DOB: 1940/12/23 DOA: 02/12/2022 PCP: Idelle Crouch, MD  Brief History   HPI on admission: "[name] [name] is a 81 y.o. female with medical history significant of HTN, HLD, stroke (patient is not aware of this diagnosis),hypothyroidism, left breast cancer (s/p for lumpectomy and radiation therapy), skin cancer, diverticulosis, post herpetic neuralgia, gallstone, possible esophagitis, who presents with syncope and abdominal pain.   Patient states that she has right upper quadrant abdominal pain for several months.  She was diagnosed as gallstone.  Right upper quadrant ultrasound on 01/18/2022 showed tiny gallstones without evidence of cholecystitis.  Patient was seen by Dr. Dahlia Byes of surgery on 7/24, planning to do surgery, however patient was found to have hyponatremia, therefore surgery has not been scheduled yet.  Patient states that her abdominal pain has worsened the last night. It is located in the right lower quadrant and epigastric area, constant, aching and burning-like pain, 8 out of 10 in severity, radiating to the middle back.  Associated with nausea, but no vomiting, diarrhea or abdominal pain.  No fever or chills.  Patient does not have chest pain, cough, shortness breath.  Denies symptoms of UTI. Of note, pt is started on Diflucan on 8/3 for possible esophagitis by GI."   She had a brief syncopal episode in the ED, found to be bradycardic, but not hypotensive.  Metoprolol held.  HR subsequently stable   Labs notable for hyponatremia with Na 126.  UA was concerning for infection, pt started on empiric antibiotics pending cultures.  CT head negative.     Admitted to the hospital and started on IV fluids. Nephrology consulted after Na fell to 122 after initial fluids were given.  General surgery consulted given plan for cholecystectomy.  The patient underwent a robotic assisted laparoscopic cholecystectomy on 02/14/2022 with Dr. Dahlia Byes. She has  tolerated the procedure well.  The patient's sodium dropped to 127 following surgery, but has recovered to 129 for the past 2 days.  Overnight the patient developed a port hernia with bowel incarceration. She was taken back to the OR this morning for laparoscopic repair. The patient was found to have contusion of bowel without perforation or necrosis. She has tolerated the procedure well. Surgery placed her on a clear liquid diet following surgery. She is tolerating a regular diet now.  She will be discharged to home tomorrow. Consultants  Nephrology General Surgery  Procedures  robotic assisted laparoscopic cholecystectomy   Antibiotics   Anti-infectives (From admission, onward)    Start     Dose/Rate Route Frequency Ordered Stop   02/17/22 1015  sodium chloride 0.9 % with cefoTEtan (CEFOTAN) ADS Med       Note to Pharmacy: Trudie Reed S: cabinet override      02/17/22 1015 02/17/22 1018   02/17/22 0830  cefoTEtan (CEFOTAN) 2 g in sodium chloride 0.9 % 100 mL IVPB        2 g 200 mL/hr over 30 Minutes Intravenous On call to O.R. 02/17/22 0735 02/17/22 1020   02/12/22 1000  fluconazole (DIFLUCAN) tablet 100 mg        100 mg Oral Daily 02/12/22 0816     02/12/22 0830  cefTRIAXone (ROCEPHIN) 1 g in sodium chloride 0.9 % 100 mL IVPB  Status:  Discontinued        1 g 200 mL/hr over 30 Minutes Intravenous Every 24 hours 02/12/22 0818 02/16/22 1449      Subjective  The patient is  complaining of bilateral "aching" in her legs since admission.  Objective   Vitals:  Vitals:   02/20/22 0911 02/20/22 1511  BP: 126/68 (!) 149/77  Pulse: 86 91  Resp: 18 19  Temp: 97.9 F (36.6 C) 97.6 F (36.4 C)  SpO2: 98% 99%    Exam:  Constitutional:  The patient is awake and alert. No acute distress. Respiratory:  No increased work of breathing. No wheezes, rales, or rhonchi No tactile fremitus Cardiovascular:  Regular rate and rhythm No murmurs, ectopy, or gallups. No lateral PMI. No  thrills. Abdomen:  Abdomen is soft, slightly tender, non-distended No hernias, masses, or organomegaly Hypoactive bowel sounds.  Musculoskeletal:  No cyanosis, clubbing, or edema Skin:  No rashes, lesions, ulcers palpation of skin: no induration or nodules Neurologic:  CN 2-12 intact Sensation all 4 extremities intact Psychiatric:  Mental status Mood, affect appropriate Orientation to person, place, time  judgment and insight appear intact   I have personally reviewed the following:   Today's Data  Vitals  Lab Data  CBC BMP  Micro Data  Urine culture 02/12/2022 has had poly microbial growth.   Imaging  CT chest abdomen pelvis CT head  Cardiology Data  EKG Echocardiogram  Other Data    Scheduled Meds:  acetaminophen  1,000 mg Oral Q6H   diclofenac Sodium  4 g Topical QID   enoxaparin (LOVENOX) injection  40 mg Subcutaneous Q24H   fluconazole  100 mg Oral Daily   losartan  25 mg Oral Daily   pantoprazole  40 mg Oral Daily   pregabalin  50 mg Oral BID   sodium chloride  1 g Oral TID WC   sucralfate  1 g Oral QID    Principal Problem:   Hyponatremia Active Problems:   Syncope   Cholelithiasis   Abdominal pain   History of CVA (cerebrovascular accident)   Hypertension   HLD (hyperlipidemia)   UTI (urinary tract infection)   Hypothyroidism, adult   Esophagitis   Hypokalemia   Port-site hernia   Restless leg syndrome   LOS: 8 days   A & P  Assessment and Plan: * Hyponatremia Na 126 on admission.  Hypotonic.  On exam pt appears mildly hypovolemic.  Suspect due to poor oral intake and dehydration.  Pt reports drinking about three 12 oz glasses of water daily, sometimes less.  Other differential diagnosis includes SIADH.  TSH normal, free T4 mildly elevated. After initial fluids given, Na repeat was 122. Nephrology was consulted and recommended fluid restriction and salt tablets, suspecting SIADH.   The patient was initially continued on IV fluids  with fluid restriction and salt tablets given. Her sodium improved to 133. The fluids were then stopped on 02/13/2022. Na dropped to 130 on the morning of 02/14/2022. Then they dropped to 125 on the morning of 02/15/2022. Nephrology gave one dose of tolvaptan. Na is stable in the 129-130 range since 02/17/2022. Continue to monitor.    Syncope Etiology is not clear, suspect bradycardic episode or vasovagal syncope, orthostatic, TIA/stroke, cardiac arrhythmia, ACS (less likely, given no chest pain and normal trop) MRI brain negative. Likely related to cholecystitis.  Echo with normal EF, mild MR. -- Orthostatic vital signs  -- Neuro checks  --PT/OT --Telemetry  Cholelithiasis RUQ ultrasound on 01/18/2022 showed tiny gallstone, without evidence of cholecystitis.  On admission, no fever or leukocytosis.  Lipase normal 35, liver function normal. --General surgery following --Robotic assisted cholecystectomy on 02/14/2022.  --Supportive care - antiemetics, pain control  PRN    Abdominal pain Likely due to gallstone/cholecystitis.   CTA negative for dissection. --General surgery following --S/P Robotic assisted cholecystectomy on 02/14/2022.    History of CVA (cerebrovascular accident) Stroke is listed in medical problem list, but the patient is not aware of having prior stroke.   Not taking aspirin or pravastatin currently. MRI brain no acute findings, mild small vessel ischemic diesase. --Hold off aspirin due to possible esophagitis    Hypertension Will restart Cozaar.  --IV hydralazine as needed for now  HLD (hyperlipidemia) Patient is not taking pravastatin -Follow-up with PCP  UTI (urinary tract infection) Empiric IV Rocephin Urine culture has had polymicrobial growth. IV antibiotics have been stopped.  Hypothyroidism, adult Patient is not taking medications currently TSH normal. Free T4 mildly elevated. PCP follow up.  Restless leg syndrome The patient has complained of  continual discomfort in her legs bilaterally since admission. Nothing seems to help including morphine. I believe that this may be restless leg syndrome. I started the patient on low dose lyrica. She said that it improved a little. Lyrica was increased, and it seems to have helped.  Port-site hernia Overnight on 8/17-8/18 the patient developed a port hernia with bowel incarceration. She was taken back to the OR this morning for laparoscopic repair. The patient was found to have contusion of bowel without perforation or necrosis. She has tolerated the procedure well. Surgery  placed her on a clear liquid diet following surgery. She is now tolerating a regular diet. Anticipating discharge to home tomorrow.  Hypokalemia K 3.4 today. Monitor and supplement as necessary. Monitor BMP, replace K PRN.  Esophagitis - Continue Diflucan -Continue Protonix and Carafate   I have seen and examined this patient myself. I have spent 34 minutes in her evaluation and care.  DVT prophylaxis: Lovenox Code Status: Partial Code Family Communication: Family at bedside Disposition Plan: home    Malorie Bigford, DO [facility] Direct contact: see [email or web address]  7PM-7AM contact night coverage as above 02/20/2022, 4:19 PM  LOS: 3 days

## 2022-02-20 NOTE — Progress Notes (Signed)
[facility]  ROUNDING NOTE   Subjective:   Patient resting in bed, daughter at bedside Continues to complain of fatigue Appetite improving with solid meals  Sodium 130  Objective:  Vital signs in last 24 hours:  Temp:  [97.6 F (36.4 C)-98.5 F (36.9 C)] 97.9 F (36.6 C) (08/21 0911) Pulse Rate:  [86-94] 86 (08/21 0911) Resp:  [17-18] 18 (08/21 0911) BP: (126-163)/(68-91) 126/68 (08/21 0911) SpO2:  [98 %-99 %] 98 % (08/21 0911)  Weight change:  Filed Weights   02/12/22 0427 02/17/22 0835  Weight: 49 kg 49 kg    Intake/Output: I/O last 3 completed shifts: In: 360 [P.O.:360] Out: 900 [Urine:900]   Intake/Output this shift:  Total I/O In: 240 [P.O.:240] Out: -   Physical Exam: General: NAD  Head: Normocephalic, atraumatic. Moist oral mucosal membranes  Eyes: Anicteric  Lungs:  Clear to auscultation, normal effort, room air  Heart: Regular rate and rhythm  Abdomen:  Soft, tender, nondistended, surgical lap sites  Extremities: No peripheral edema.  Neurologic: Nonfocal, moving all four extremities, bilateral upper extremity tremors  Skin: No lesions  Access: None    Basic Metabolic Panel: Recent Labs  Lab 02/15/22 0529 02/15/22 2128 02/16/22 1306 02/17/22 0607 02/18/22 0627 02/19/22 0629 02/20/22 0619  NA 125*   < > 129* 129* 130* 129* 130*  K 4.0  --  3.9 3.5 3.6 3.5 3.4*  CL 91*  --  97* 99 96* 96* 96*  CO2 25  --  '23 23 25 27 25  '$ GLUCOSE 164*  --  151* 117* 110* 107* 108*  BUN 6*  --  '12 19 11 10 12  '$ CREATININE 0.48  --  0.59 0.54 0.59 0.65 0.56  CALCIUM 9.4  --  9.2 9.0 9.3 8.8* 9.1  MG 1.7  --   --  1.9  --   --   --    < > = values in this interval not displayed.     Liver Function Tests: Recent Labs  Lab 02/15/22 0529 02/17/22 0607  AST 50* 31  ALT 37 29  ALKPHOS 49 44  BILITOT 0.5 0.5  PROT 6.8 6.2*  ALBUMIN 4.1 3.5    No results for input(s): "LIPASE", "AMYLASE" in the last 168 hours.  No results for input(s):  "AMMONIA" in the last 168 hours.  CBC: Recent Labs  Lab 02/17/22 0257 02/17/22 0607 02/18/22 0627 02/19/22 0629 02/20/22 0619  WBC 13.2* 11.4* 11.1* 8.2 6.5  NEUTROABS  --  9.3* 8.9* 6.2 4.5  HGB 12.6 11.9* 11.6* 10.9* 11.1*  HCT 36.6 34.7* 33.7* 31.5* 32.8*  MCV 85.9 86.5 86.6 85.8 86.5  PLT 355 328 382 324 357     Cardiac Enzymes: No results for input(s): "CKTOTAL", "CKMB", "CKMBINDEX", "TROPONINI" in the last 168 hours.  BNP: Invalid input(s): "POCBNP"  CBG: No results for input(s): "GLUCAP" in the last 168 hours.   Microbiology: Results for orders placed or performed during the hospital encounter of 02/12/22  Urine Culture     Status: Abnormal   Collection Time: 02/12/22  6:22 AM   Specimen: Urine, Clean Catch  Result Value Ref Range Status   Specimen Description   Final    URINE, CLEAN CATCH Performed at [facility], [address], [place], [place] [address]    Special Requests   Final    NONE Performed at [facility], [address] [place], [place] [address]    Culture MULTIPLE SPECIES PRESENT, SUGGEST RECOLLECTION (  A)  Final   Report Status 02/13/2022 FINAL  Final    Coagulation Studies: No results for input(s): "LABPROT", "INR" in the last 72 hours.   Urinalysis: No results for input(s): "COLORURINE", "LABSPEC", "PHURINE", "GLUCOSEU", "HGBUR", "BILIRUBINUR", "KETONESUR", "PROTEINUR", "UROBILINOGEN", "NITRITE", "LEUKOCYTESUR" in the last 72 hours.  Invalid input(s): "APPERANCEUR"     Imaging: No results found.   Medications:      acetaminophen  1,000 mg Oral Q6H   diclofenac Sodium  4 g Topical QID   enoxaparin (LOVENOX) injection  40 mg Subcutaneous Q24H   fluconazole  100 mg Oral Daily   pantoprazole  40 mg Oral Daily   pregabalin  50 mg Oral BID   sodium chloride  1 g Oral TID WC   sucralfate  1 g Oral QID   alum & mag hydroxide-simeth, hydrALAZINE, ketorolac, morphine injection, ondansetron (ZOFRAN) IV,  oxyCODONE, senna  Assessment/ Plan:  [name] is a 81 y.o.  female with a PMHx of hypertension, coronary artery disease, hyperlipidemia, history of CVA, history of breast cancer s/p radiation and hysterectomy, diverticulosis and postherpetic neuralgia who is now admitted with history of abdominal pain and a syncopal episode.    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  Sodium on admission 126 with lowest point of 122. Sodium initially corrected to 133 with IV fluids and salt tablets.    Sodium 130 today, appears to be new baseline. Will continue salt tabs for additional week after discharge. Recommend follow up with PCP for labs and continued monitoring.   2.  Chronic cholecystitis.  Underwent laparoscopic cholecystectomy on 02/14/2022 with hernia repair on 02/17/2022.  Surgery following.    LOS: [place] 8/21/20232:31 PM

## 2022-02-20 NOTE — Progress Notes (Signed)
[facility] Day(s): 8.   Post op day(s): 3 Days Post-Op.   Interval History:  Patient seen and examined No acute events or new complaints overnight.  Patient reports she feels better this morning; feels like she got a good nights sleep Abdominal soreness No fever, chills, nausea, emesis She remains without leukocytosis; 6.5K Hyponatremia stable; 130  Renal function normal; sCr - 0.56; UO - unmeasured Mild hypokalemia to 3.4 She is on regular diet; having bowel function   Vital signs in last 24 hours: [min-max] current  Temp:  [97.6 F (36.4 C)-98.5 F (36.9 C)] 97.7 F (36.5 C) (08/21 0400) Pulse Rate:  [86-94] 89 (08/21 0400) Resp:  [16-18] 18 (08/21 0400) BP: (140-163)/(69-91) 163/91 (08/21 0400) SpO2:  [98 %-99 %] 98 % (08/21 0400)     Height: 5' (152.4 cm) Weight: 49 kg BMI (Calculated): 21.1   Intake/Output last 2 shifts:  08/20 0701 - 08/21 0700 In: 360 [P.O.:360] Out: -    Physical Exam:  Constitutional: alert, cooperative and no distress  Respiratory: breathing non-labored at rest  Cardiovascular: regular rate and sinus rhythm  Gastrointestinal: Soft, incisional soreness, non-distended, no rebound/guarding Integumentary: Laparoscopic incisions are CDI with dermabond, no erythema/drainage, some early ecchymosis   Labs:     Latest Ref Rng & Units 02/20/2022    6:19 AM 02/19/2022    6:29 AM 02/18/2022    6:27 AM  CBC  WBC 4.0 - 10.5 K/uL 6.5  8.2  11.1   Hemoglobin 12.0 - 15.0 g/dL 11.1  10.9  11.6   Hematocrit 36.0 - 46.0 % 32.8  31.5  33.7   Platelets 150 - 400 K/uL 357  324  382       Latest Ref Rng & Units 02/20/2022    6:19 AM 02/19/2022    6:29 AM 02/18/2022    6:27 AM  CMP  Glucose 70 - 99 mg/dL 108  107  110   BUN 8 - 23 mg/dL '12  10  11   '$ Creatinine 0.44 - 1.00 mg/dL 0.56  0.65  0.59   Sodium 135 - 145 mmol/L 130  129  130   Potassium 3.5 - 5.1 mmol/L 3.4  3.5  3.6   Chloride 98 - 111 mmol/L 96   96  96   CO2 22 - 32 mmol/L '25  27  25   '$ Calcium 8.9 - 10.3 mg/dL 9.1  8.8  9.3     Imaging studies: No new pertinent imaging studies   Assessment/Plan:  81 y.o. female 3 Days Post-Op s/p laparoscopic incisional hernia repair for incarcerated incisional hernia containing small bowel 6 days s/p  robotic assisted laparoscopic cholecystectomy for chronic cholecystitis, complicated by hyponatremia   - Okay to continue soft/regular diet            - Monitor abdominal examination - Pain control prn; antiemetics prn             - Monitor hyponatremia; improved and stable - Mobilization as tolerated             - Further management per primary service   - Discharge Planning: Okay for discharge from surgical perspective. She would like to stay another day for reassurance, which is reasonable. I will be happy to see her in ~2 weeks as outpatient    All of the above findings and recommendations were discussed with the patient, patient's family (daughter at bedside), and the medical team, and all of  patient's and family's questions were answered to their expressed satisfaction.  -- Edison Simon, PA-C [facility] 02/20/2022, 7:31 AM M-F: 7am - 4pm

## 2022-02-20 NOTE — Progress Notes (Signed)
Physical Therapy Treatment Patient Details Name: [name] [name] MRN: [id number] DOB: 13-Oct-1940 Today's Date: 02/20/2022   History of Present Illness [name] [name] is 81yoF admitted for syncope, hyponatremia, UTI, gallstones, and abdominal pain. PMH includes: HTN, HLD, CVA, hypothyroidism, breast CA, skin CA, diverticulosis, post herpetic neualgia, and esophagitis.8/18 incisional hernia repair, 8/15 lap-chole.    PT Comments    Pt in bed, DTR at bedside. Continues to feel better each day. AMB in hall ~ 120f, limited by leg fatigue. Pt and DTR go over stairs training, multiple configurations to address each potential scenario for home entry. Discussed obtaining SPC in future for stairs training with HHPT. DTR will assist with entry at DC.    Recommendations for follow up therapy are one component of a multi-disciplinary discharge planning process, led by the attending physician.  Recommendations may be updated based on patient status, additional functional criteria and insurance authorization.  Follow Up Recommendations  Home health PT     Assistance Recommended at Discharge Intermittent Supervision/Assistance  Patient can return home with the following A little help with walking and/or transfers;A little help with bathing/dressing/bathroom;Assistance with cooking/housework;Assistance with feeding;Direct supervision/assist for medications management;Direct supervision/assist for financial management;Assist for transportation;Help with stairs or ramp for entrance   Equipment Recommendations  Rolling walker (2 wheels)    Recommendations for Other Services       Precautions / Restrictions Precautions Precautions: Fall Restrictions Weight Bearing Restrictions: No     Mobility  Bed Mobility Overal bed mobility: Needs Assistance Bed Mobility: Supine to Sit, Sit to Supine     Supine to sit: Min guard Sit to supine: Min assist   General bed mobility comments: minA of leg into bed  still    Transfers Overall transfer level: Needs assistance Equipment used: Rolling walker (2 wheels) Transfers: Sit to/from Stand Sit to Stand: Supervision                Ambulation/Gait Ambulation/Gait assistance: Supervision Gait Distance (Feet): 160 Feet Assistive device: Rolling walker (2 wheels)         General Gait Details: feeling stronger, but reports her wlaking is a little less confident today without a chair follow   Stairs Stairs: Yes     Number of Stairs: 8 General stair comments: up 6 with 2 rails, then down 3 with 1 rail/1 hand, down 2 with 1 hand only, then 1 with hand and SPC; then attempts up 2 with hand and SPC, down backwards with same (less steady)   Wheelchair Mobility    Modified Rankin (Stroke Patients Only)       Balance                                            Cognition                                                Exercises      General Comments        Pertinent Vitals/Pain Pain Assessment Pain Assessment:  (just achiness in legs)    Home Living                          Prior Function  PT Goals (current goals can now be found in the care plan section) Acute Rehab PT Goals Patient Stated Goal: to get back to cooking PT Goal Formulation: With patient Time For Goal Achievement: 02/26/22 Potential to Achieve Goals: Good Progress towards PT goals: Progressing toward goals    Frequency    Min 2X/week      PT Plan Current plan remains appropriate    Co-evaluation              AM-PAC PT "6 Clicks" Mobility   Outcome Measure  Help needed turning from your back to your side while in a flat bed without using bedrails?: A Lot Help needed moving from lying on your back to sitting on the side of a flat bed without using bedrails?: A Lot Help needed moving to and from a bed to a chair (including a wheelchair)?: A Little Help needed standing up  from a chair using your arms (e.g., wheelchair or bedside chair)?: A Little Help needed to walk in hospital room?: A Little Help needed climbing 3-5 steps with a railing? : A Lot 6 Click Score: 15    End of Session Equipment Utilized During Treatment: Gait belt Activity Tolerance: Patient tolerated treatment well;No increased pain;Patient limited by fatigue Patient left: with call bell/phone within reach;with family/visitor present;in bed Nurse Communication: Mobility status PT Visit Diagnosis: Other abnormalities of gait and mobility (R26.89);Muscle weakness (generalized) (M62.81)     Time: 7619-5093 PT Time Calculation (min) (ACUTE ONLY): 28 min  Charges:  $Gait Training: 23-37 mins                    1:11 PM, 02/20/22 Etta Grandchild, PT, DPT Physical Therapist - [facility]  [phone] ([place])    Jeannetta Cerutti C 02/20/2022, 1:09 PM

## 2022-02-21 DIAGNOSIS — E871 Hypo-osmolality and hyponatremia: Secondary | ICD-10-CM | POA: Diagnosis not present

## 2022-02-21 LAB — CBC WITH DIFFERENTIAL/PLATELET
Abs Immature Granulocytes: 0.09 10*3/uL — ABNORMAL HIGH (ref 0.00–0.07)
Basophils Absolute: 0 10*3/uL (ref 0.0–0.1)
Basophils Relative: 0 %
Eosinophils Absolute: 0.2 10*3/uL (ref 0.0–0.5)
Eosinophils Relative: 3 %
HCT: 32.6 % — ABNORMAL LOW (ref 36.0–46.0)
Hemoglobin: 11.2 g/dL — ABNORMAL LOW (ref 12.0–15.0)
Immature Granulocytes: 2 %
Lymphocytes Relative: 24 %
Lymphs Abs: 1.4 10*3/uL (ref 0.7–4.0)
MCH: 29.9 pg (ref 26.0–34.0)
MCHC: 34.4 g/dL (ref 30.0–36.0)
MCV: 87.2 fL (ref 80.0–100.0)
Monocytes Absolute: 0.5 10*3/uL (ref 0.1–1.0)
Monocytes Relative: 9 %
Neutro Abs: 3.7 10*3/uL (ref 1.7–7.7)
Neutrophils Relative %: 62 %
Platelets: 379 10*3/uL (ref 150–400)
RBC: 3.74 MIL/uL — ABNORMAL LOW (ref 3.87–5.11)
RDW: 14.1 % (ref 11.5–15.5)
WBC: 5.8 10*3/uL (ref 4.0–10.5)
nRBC: 0 % (ref 0.0–0.2)

## 2022-02-21 LAB — BASIC METABOLIC PANEL
Anion gap: 6 (ref 5–15)
BUN: 8 mg/dL (ref 8–23)
CO2: 27 mmol/L (ref 22–32)
Calcium: 8.8 mg/dL — ABNORMAL LOW (ref 8.9–10.3)
Chloride: 98 mmol/L (ref 98–111)
Creatinine, Ser: 0.48 mg/dL (ref 0.44–1.00)
GFR, Estimated: >60 mL/min (ref >60)
Glucose, Bld: 104 mg/dL — ABNORMAL HIGH (ref 70–99)
Potassium: 3.3 mmol/L — ABNORMAL LOW (ref 3.5–5.1)
Sodium: 131 mmol/L — ABNORMAL LOW (ref 135–145)

## 2022-02-21 MED ORDER — PREGABALIN 50 MG PO CAPS: 50.0000 mg | ORAL_CAPSULE | Freq: Two times a day (BID) | ORAL | 0 refills | Status: AC

## 2022-02-21 MED ORDER — POTASSIUM CHLORIDE CRYS ER 20 MEQ PO TBCR
40.0000 meq | EXTENDED_RELEASE_TABLET | ORAL | Status: AC
  Administered 2022-02-21 (×2): 40 meq via ORAL
  Filled 2022-02-21 (×2): qty 2

## 2022-02-21 MED ORDER — SODIUM CHLORIDE 1 G PO TABS: 1.0000 g | ORAL_TABLET | Freq: Three times a day (TID) | ORAL | 0 refills | Status: AC

## 2022-02-21 NOTE — Progress Notes (Signed)
[facility]  ROUNDING NOTE   Subjective:   Patient seen resting in bed, daughter at bedside Alert and oriented Appetite remains poor but improving No lower extremity edema Patient remains on room air States she feels better and is ready for discharge  Sodium 131  Objective:  Vital signs in last 24 hours:  Temp:  [97.6 F (36.4 C)-98.5 F (36.9 C)] 98.3 F (36.8 C) (08/22 0757) Pulse Rate:  [79-91] 81 (08/22 0757) Resp:  [17-19] 18 (08/22 0757) BP: (139-161)/(76-93) 161/93 (08/22 0757) SpO2:  [98 %-100 %] 100 % (08/22 0757)  Weight change:  Filed Weights   02/12/22 0427 02/17/22 0835  Weight: 49 kg 49 kg    Intake/Output: I/O last 3 completed shifts: In: 240 [P.O.:240] Out: 400 [Urine:400]   Intake/Output this shift:  Total I/O In: -  Out: 300 [Urine:300]  Physical Exam: General: NAD  Head: Normocephalic, atraumatic. Moist oral mucosal membranes  Eyes: Anicteric  Lungs:  Clear to auscultation, normal effort, room air  Heart: Regular rate and rhythm  Abdomen:  Soft, tender, nondistended, surgical lap sites  Extremities: No peripheral edema.  Neurologic: Nonfocal, moving all four extremities, bilateral upper extremity tremors  Skin: No lesions  Access: None    Basic Metabolic Panel: Recent Labs  Lab 02/15/22 0529 02/15/22 2128 02/17/22 0607 02/18/22 0627 02/19/22 0629 02/20/22 0619 02/21/22 0307  NA 125*   < > 129* 130* 129* 130* 131*  K 4.0   < > 3.5 3.6 3.5 3.4* 3.3*  CL 91*   < > 99 96* 96* 96* 98  CO2 25   < > '23 25 27 25 27  '$ GLUCOSE 164*   < > 117* 110* 107* 108* 104*  BUN 6*   < > '19 11 10 12 8  '$ CREATININE 0.48   < > 0.54 0.59 0.65 0.56 0.48  CALCIUM 9.4   < > 9.0 9.3 8.8* 9.1 8.8*  MG 1.7  --  1.9  --   --   --   --    < > = values in this interval not displayed.     Liver Function Tests: Recent Labs  Lab 02/15/22 0529 02/17/22 0607  AST 50* 31  ALT 37 29  ALKPHOS 49 44  BILITOT 0.5 0.5  PROT 6.8 6.2*  ALBUMIN  4.1 3.5    No results for input(s): "LIPASE", "AMYLASE" in the last 168 hours.  No results for input(s): "AMMONIA" in the last 168 hours.  CBC: Recent Labs  Lab 02/17/22 0607 02/18/22 0627 02/19/22 0629 02/20/22 0619 02/21/22 0307  WBC 11.4* 11.1* 8.2 6.5 5.8  NEUTROABS 9.3* 8.9* 6.2 4.5 3.7  HGB 11.9* 11.6* 10.9* 11.1* 11.2*  HCT 34.7* 33.7* 31.5* 32.8* 32.6*  MCV 86.5 86.6 85.8 86.5 87.2  PLT 328 382 324 357 379     Cardiac Enzymes: No results for input(s): "CKTOTAL", "CKMB", "CKMBINDEX", "TROPONINI" in the last 168 hours.  BNP: Invalid input(s): "POCBNP"  CBG: No results for input(s): "GLUCAP" in the last 168 hours.   Microbiology: Results for orders placed or performed during the hospital encounter of 02/12/22  Urine Culture     Status: Abnormal   Collection Time: 02/12/22  6:22 AM   Specimen: Urine, Clean Catch  Result Value Ref Range Status   Specimen Description   Final    URINE, CLEAN CATCH Performed at [facility], [address] [place], [place] [address]    Special Requests   Final  NONE Performed at [facility], [address] [place], [place] [address]    Culture MULTIPLE SPECIES PRESENT, SUGGEST RECOLLECTION (A)  Final   Report Status 02/13/2022 FINAL  Final    Coagulation Studies: No results for input(s): "LABPROT", "INR" in the last 72 hours.   Urinalysis: No results for input(s): "COLORURINE", "LABSPEC", "PHURINE", "GLUCOSEU", "HGBUR", "BILIRUBINUR", "KETONESUR", "PROTEINUR", "UROBILINOGEN", "NITRITE", "LEUKOCYTESUR" in the last 72 hours.  Invalid input(s): "APPERANCEUR"     Imaging: No results found.   Medications:      acetaminophen  1,000 mg Oral Q6H   diclofenac Sodium  4 g Topical QID   enoxaparin (LOVENOX) injection  40 mg Subcutaneous Q24H   fluconazole  100 mg Oral Daily   losartan  25 mg Oral Daily   pantoprazole  40 mg Oral Daily   pregabalin  50 mg Oral BID   sodium chloride  1 g Oral  TID WC   sucralfate  1 g Oral QID   alum & mag hydroxide-simeth, hydrALAZINE, ketorolac, morphine injection, ondansetron (ZOFRAN) IV, oxyCODONE, senna  Assessment/ Plan:  [name] is a 81 y.o.  female with a PMHx of hypertension, coronary artery disease, hyperlipidemia, history of CVA, history of breast cancer s/p radiation and hysterectomy, diverticulosis and postherpetic neuralgia who is now admitted with history of abdominal pain and a syncopal episode.    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  Sodium on admission 126 with lowest point of 122. Sodium initially corrected to 133 with IV fluids and salt tablets.    Sodium 131. Will continue salt tabs for additional week after discharge. Recommend follow up with PCP for labs and continued monitoring.   2.  Chronic cholecystitis.  Underwent laparoscopic cholecystectomy on 02/14/2022 with hernia repair on 02/17/2022.  Surgery following.    LOS: 9 [place] 8/22/202312:23 PM

## 2022-02-21 NOTE — Discharge Summary (Signed)
Physician Discharge Summary  MARIYA MOTTLEY YWV:[id number] DOB: July 02, 1941 DOA: 02/12/2022  PCP: Idelle Crouch, MD  Admit date: 02/12/2022 Discharge date: 02/21/2022  Admitted From: Home Disposition:  Home with home health  Recommendations for Outpatient Follow-up:  Follow up with PCP in 1-2 weeks Follow-up outpatient general surgery  Home Health: Yes PT OT Equipment/Devices: None  Discharge Condition: Stable CODE STATUS: Partial Diet recommendation: Regular  Brief/Interim Summary: 81 y.o. female with medical history significant of HTN, HLD, stroke (patient is not aware of this diagnosis),hypothyroidism, left breast cancer (s/p for lumpectomy and radiation therapy), skin cancer, diverticulosis, post herpetic neuralgia, gallstone, possible esophagitis, who presents with syncope and abdominal pain.   Patient states that she has right upper quadrant abdominal pain for several months.  She was diagnosed as gallstone.  Right upper quadrant ultrasound on 01/18/2022 showed tiny gallstones without evidence of cholecystitis.  Patient was seen by Dr. Dahlia Byes of surgery on 7/24, planning to do surgery, however patient was found to have hyponatremia, therefore surgery has not been scheduled yet.  Patient states that her abdominal pain has worsened the last night. It is located in the right lower quadrant and epigastric area, constant, aching and burning-like pain, 8 out of 10 in severity, radiating to the middle back.  Associated with nausea, but no vomiting, diarrhea or abdominal pain.  No fever or chills.  Patient does not have chest pain, cough, shortness breath.  Denies symptoms of UTI. Of note, pt is started on Diflucan on 8/3 for possible esophagitis by GI."   She had a brief syncopal episode in the ED, found to be bradycardic, but not hypotensive.  Metoprolol held.  HR subsequently stable   Labs notable for hyponatremia with Na 126.  UA was concerning for infection, pt started on empiric  antibiotics pending cultures.  CT head negative.     Admitted to the hospital and started on IV fluids. Nephrology consulted after Na fell to 122 after initial fluids were given.  General surgery consulted given plan for cholecystectomy.   The patient underwent a robotic assisted laparoscopic cholecystectomy on 02/14/2022 with Dr. Dahlia Byes. She has tolerated the procedure well.   The patient's sodium dropped to 127 following surgery, but has recovered to 129 for the past 2 days.   Overnight the patient developed a port hernia with bowel incarceration. She was taken back to the OR this morning for laparoscopic repair. The patient was found to have contusion of bowel without perforation or necrosis. She has tolerated the procedure well. Surgery placed her on a clear liquid diet following surgery. She is tolerating a regular diet now.  On day of discharge patient was tolerating p.o. intake without nausea vomiting or abdominal pain.  Sodium improved, approaching reference range.  Per nephrology recommendations will discharge on 1 week of sodium tabs.  Patient will see her primary care physician for repeat lab work and further discussion regarding management of hyponatremia.  She will see general surgery in follow-up as well.  Home PT and OT ordered.  Discharge plan discussed at length with patient's daughter at bedside.    Discharge Diagnoses:  Principal Problem:   Hyponatremia Active Problems:   Syncope   Cholelithiasis   Abdominal pain   History of CVA (cerebrovascular accident)   Hypertension   HLD (hyperlipidemia)   UTI (urinary tract infection)   Hypothyroidism, adult   Esophagitis   Hypokalemia   Port-site hernia   Restless leg syndrome  Acute symptomatic hyponatremia So 126 on admission.  Appears hypotonic.  Patient appeared dehydrated.  Other differentials include SIADH.  Nephrology consulted.  Suspecting SI ADH.  Patient recommended fluid restriction and salt tablets.  Sodium  improved.  131 and day of discharge.  Will discharge on 1 week of sodium tabs and follow-up with PCP.  Incarcerated hernia Cholelithiasis status post laparoscopic cholecystectomy Hernia occurred postoperatively.  Patient taken back to the operating room.  Hernia reduced.  No bowel resection performed.  Patient tolerating regular diet at time of discharge.  We will follow-up outpatient general surgery  Discharge Instructions  Discharge Instructions     Diet - low sodium heart healthy   Complete by: As directed    Increase activity slowly   Complete by: As directed    No wound care   Complete by: As directed       Allergies as of 02/21/2022       Reactions   Levofloxacin Other (See Comments)   Causes muscle problems.   Demerol [meperidine] Nausea Only   Other Nausea And Vomiting   Sulfa Antibiotics Nausea Only        Medication List     STOP taking these medications    aspirin EC 81 MG tablet   fluconazole 100 MG tablet Commonly known as: DIFLUCAN   losartan 25 MG tablet Commonly known as: COZAAR   Multi-Vitamin tablet   pravastatin 40 MG tablet Commonly known as: PRAVACHOL       TAKE these medications    Alum Hydroxide-Mag Carbonate 160-105 MG Chew Chew 2 tablets by mouth daily as needed.   diclofenac Sodium 1 % Gel Commonly known as: VOLTAREN Apply 2 g topically 4 (four) times daily.   ondansetron 4 MG disintegrating tablet Commonly known as: ZOFRAN-ODT Take 1 tablet (4 mg total) by mouth every 6 (six) hours as needed for nausea or vomiting.   pantoprazole 40 MG tablet Commonly known as: PROTONIX Take by mouth.   polyethylene glycol powder 17 GM/SCOOP powder Commonly known as: GLYCOLAX/MIRALAX Take by mouth.   pregabalin 50 MG capsule Commonly known as: LYRICA Take 1 capsule (50 mg total) by mouth 2 (two) times daily for 10 days.   promethazine 25 MG suppository Commonly known as: PHENERGAN SMARTSIG:1 SUPPOS Rectally Every Night PRN    senna 8.6 MG tablet Commonly known as: SENOKOT Take 1 tablet by mouth daily.   simethicone 80 MG chewable tablet Commonly known as: MYLICON Chew 989 mg by mouth 4 (four) times daily as needed.   sodium chloride 1 g tablet Take 1 tablet (1 g total) by mouth 3 (three) times daily with meals for 7 days.   sucralfate 1 g tablet Commonly known as: Carafate Take 1 tablet (1 g total) by mouth 4 (four) times daily.               Durable Medical Equipment  (From admission, onward)           Start     Ordered   02/14/22 1105  For home use only DME Walker rolling  Once       Question Answer Comment  Walker: With [place]   Patient needs a walker to treat with the following condition Difficulty walking      02/14/22 1104            Follow-up Information     Tylene Fantasia, PA-C Follow up on 03/02/2022.   Specialty: Physician Assistant Why: s/p cholecystectomy and incisional hernia repair Contact information: [address]  [address] [place] [address] [phone]                Allergies  Allergen Reactions   Levofloxacin Other (See Comments)    Causes muscle problems.   Demerol [Meperidine] Nausea Only   Other Nausea And Vomiting   Sulfa Antibiotics Nausea Only    Consultations: Nephrology   Procedures/Studies: CT ABDOMEN PELVIS WO CONTRAST  Result Date: 02/17/2022 CLINICAL DATA:  Abdominal pain following recent cholecystectomy EXAM: CT ABDOMEN AND PELVIS WITHOUT CONTRAST TECHNIQUE: Multidetector CT imaging of the abdomen and pelvis was performed following the standard protocol without IV contrast. RADIATION DOSE REDUCTION: This exam was performed according to the departmental dose-optimization program which includes automated exposure control, adjustment of the mA and/or kV according to patient size and/or use of iterative reconstruction technique. COMPARISON:  02/12/2022 FINDINGS: Lower chest: Lung bases are free of acute infiltrate or  sizable effusion. Hepatobiliary: Gallbladder has been surgically removed. Liver is within normal limits. No biloma is noted in the gallbladder fossa. Pancreas: Unremarkable. No pancreatic ductal dilatation or surrounding inflammatory changes. Spleen: Normal in size without focal abnormality. Adrenals/Urinary Tract: Adrenal glands are within normal limits. Kidneys demonstrate no renal calculi or obstructive changes. The bladder is well distended. Stomach/Bowel: Scattered fecal material is noted throughout the colon. No obstructive or inflammatory changes of the colon are noted. The appendix has been surgically removed. Multiple mildly dilated fluid-filled loops of small bowel are noted. These changes are secondary to herniated loops of small bowel through a defect in the right anterior abdominal wall best seen on images 44 and 43 of series 2. Distal most small bowel is within normal limits. Vascular/Lymphatic: Aortic atherosclerosis. No enlarged abdominal or pelvic lymph nodes. Reproductive: Status post hysterectomy. No adnexal masses. Other: No abdominal wall hernia or abnormality. No abdominopelvic ascites. Mild free air is noted related to the recent laparoscopic surgery. Musculoskeletal: No acute or significant osseous findings. IMPRESSION: New defect in the right anterior abdominal wall which may be related to the recent laparoscopic surgery. Herniated small bowel loops are identified within this defect causing a very early partial small bowel obstruction. Changes of recent cholecystectomy without biloma. Electronically Signed   By: Inez Catalina M.D.   On: 02/17/2022 02:58   US Abdomen Limited RUQ (LIVER/GB)  Result Date: 02/13/2022 CLINICAL DATA:  Pain right upper quadrant EXAM: ULTRASOUND ABDOMEN LIMITED RIGHT UPPER QUADRANT COMPARISON:  01/18/2022 FINDINGS: Gallbladder: There are no definite demonstrable gallbladder stones. Small echogenic foci in the dependent portion of gallbladder lumen may suggest  minimal sludge in the lumen. Technologist did not observe any tenderness over the gallbladder. There is no fluid around the gallbladder. Common bile duct: Diameter: 4.1 mm Liver: No focal lesion identified. Within normal limits in parenchymal echogenicity. Portal vein is patent on color Doppler imaging with normal direction of blood flow towards the liver. Other: None. IMPRESSION: There are no definite demonstrable gallbladder stones. There is possible minimal amount of sludge in gallbladder. There are no sonographic signs of acute cholecystitis. There is no dilation of bile ducts. Electronically Signed   By: Elmer Picker M.D.   On: 02/13/2022 16:14   ECHOCARDIOGRAM COMPLETE  Result Date: 02/13/2022    ECHOCARDIOGRAM REPORT   Patient Name:   KAHLIE DEUTSCHER [facility] Date of Exam: 02/12/2022 Medical Rec #:  [id number]       Height:       60.0 in Accession #:    [id number]      Weight:  108.0 lb Date of Birth:  December 30, 1940       BSA:          1.437 m Patient Age:    40 years        BP:           169/88 mmHg Patient Gender: F               HR:           81 bpm. Exam Location:  [facility] Procedure: 2D Echo Indications:     Syncope R55  History:         Patient has no prior history of Echocardiogram examinations.  Sonographer:     Kathlen Brunswick RDCS Referring Phys:  [place] Diagnosing Phys: Serafina Royals MD IMPRESSIONS  1. Left ventricular ejection fraction, by estimation, is 55 to 60%. The left ventricle has normal function. The left ventricle has no regional wall motion abnormalities. Left ventricular diastolic parameters were normal.  2. Right ventricular systolic function is normal. The right ventricular size is normal.  3. The mitral valve is normal in structure. Mild mitral valve regurgitation.  4. The aortic valve is normal in structure. Aortic valve regurgitation is trivial. FINDINGS  Left Ventricle: Left ventricular ejection fraction, by estimation, is 55 to 60%. The left ventricle has normal function.  The left ventricle has no regional wall motion abnormalities. The left ventricular internal cavity size was normal in size. There is  no left ventricular hypertrophy. Left ventricular diastolic parameters were normal. Right Ventricle: The right ventricular size is normal. No increase in right ventricular wall thickness. Right ventricular systolic function is normal. Left Atrium: Left atrial size was normal in size. Right Atrium: Right atrial size was normal in size. Pericardium: There is no evidence of pericardial effusion. Mitral Valve: The mitral valve is normal in structure. Mild mitral valve regurgitation. Tricuspid Valve: The tricuspid valve is normal in structure. Tricuspid valve regurgitation is mild. Aortic Valve: The aortic valve is normal in structure. Aortic valve regurgitation is trivial. Aortic valve peak gradient measures 3.9 mmHg. Pulmonic Valve: The pulmonic valve was normal in structure. Pulmonic valve regurgitation is trivial. Aorta: The aortic root and ascending aorta are structurally normal, with no evidence of dilitation. IAS/Shunts: No atrial level shunt detected by color flow Doppler.  LEFT VENTRICLE PLAX 2D LVIDd:         3.25 cm     Diastology LVIDs:         2.32 cm     LV e' medial:    4.90 cm/s LV PW:         1.12 cm     LV E/e' medial:  14.2 LV IVS:        1.03 cm     LV e' lateral:   7.29 cm/s LVOT diam:     1.80 cm     LV E/e' lateral: 9.5 LV SV:         37 LV SV Index:   26 LVOT Area:     2.54 cm  LV Volumes (MOD) LV vol d, MOD A2C: 55.2 ml LV vol d, MOD A4C: 60.8 ml LV vol s, MOD A2C: 15.7 ml LV vol s, MOD A4C: 25.1 ml LV SV MOD A2C:     39.5 ml LV SV MOD A4C:     60.8 ml LV SV MOD BP:      36.9 ml RIGHT VENTRICLE RV Basal diam:  2.13 cm RV S prime:  14.80 cm/s TAPSE (M-mode): 1.7 cm LEFT ATRIUM           Index        RIGHT ATRIUM          Index LA diam:      2.40 cm 1.67 cm/m   RA Area:     7.05 cm LA Vol (A2C): 5.5 ml  3.84 ml/m   RA Volume:   12.70 ml 8.84 ml/m LA Vol  (A4C): 18.7 ml 13.02 ml/m  AORTIC VALVE                 PULMONIC VALVE AV Area (Vmax): 1.92 cm     PV Vmax:       0.72 m/s AV Vmax:        98.60 cm/s   PV Peak grad:  2.1 mmHg AV Peak Grad:   3.9 mmHg LVOT Vmax:      74.40 cm/s LVOT Vmean:     47.100 cm/s LVOT VTI:       0.146 m  AORTA Ao Root diam: 3.00 cm MITRAL VALVE                TRICUSPID VALVE MV Area (PHT): 4.99 cm     TV Peak grad:   22.8 mmHg MV Decel Time: 152 msec     TV Vmax:        2.39 m/s MV E velocity: 69.40 cm/s MV A velocity: 104.00 cm/s  SHUNTS MV E/A ratio:  0.67         Systemic VTI:  0.15 m                             Systemic Diam: 1.80 cm Serafina Royals MD Electronically signed by Serafina Royals MD Signature Date/Time: 02/13/2022/8:44:39 AM    Final    MR BRAIN WO CONTRAST  Result Date: 02/12/2022 CLINICAL DATA:  81 year old female with nausea, vomiting, abdominal pain, altered mental status. EXAM: MRI HEAD WITHOUT CONTRAST TECHNIQUE: Multiplanar, multiecho pulse sequences of the brain and surrounding structures were obtained without intravenous contrast. COMPARISON:  Head CT earlier today. FINDINGS: Brain: No restricted diffusion to suggest acute infarction. No midline shift, mass effect, evidence of mass lesion, ventriculomegaly, extra-axial collection or acute intracranial hemorrhage. Cervicomedullary junction and pituitary are within normal limits. Patchy bilateral scattered periventricular and subcortical white matter T2 and FLAIR hyperintensity is fairly mild for age. No cortical encephalomalacia or chronic cerebral blood products. Deep gray matter nuclei, brainstem and cerebellum are within normal limits for age. Vascular: Major intracranial vascular flow voids are preserved, the distal right vertebral artery appears dominant. Skull and upper cervical spine: Degenerative ligamentous hypertrophy about the odontoid. Otherwise negative for age visible cervical spine. Visualized bone marrow signal is within normal limits.  Sinuses/Orbits: Negative.  Postoperative changes to both globes. Other: Visible internal auditory structures appear normal. Mastoids are clear. Negative visible scalp and face. IMPRESSION: 1. No acute intracranial abnormality. 2. Mild for age nonspecific cerebral white matter signal changes, most commonly due to chronic small vessel disease. Electronically Signed   By: Genevie Ann M.D.   On: 02/12/2022 11:01   CT Angio Chest/Abd/Pel for Dissection W and/or W/WO  Result Date: 02/12/2022 CLINICAL DATA:  81 year old female with right upper quadrant pain radiating between the shoulder blades. Known cholelithiasis. Nausea and vomiting. EXAM: CT ANGIOGRAPHY CHEST, ABDOMEN AND PELVIS TECHNIQUE: Non-contrast CT of the chest was initially obtained. Multidetector CT imaging through the chest,  abdomen and pelvis was performed using the standard protocol during bolus administration of intravenous contrast. Multiplanar reconstructed images and MIPs were obtained and reviewed to evaluate the vascular anatomy. RADIATION DOSE REDUCTION: This exam was performed according to the departmental dose-optimization program which includes automated exposure control, adjustment of the mA and/or kV according to patient size and/or use of iterative reconstruction technique. CONTRAST:  151m OMNIPAQUE IOHEXOL 350 MG/ML SOLN COMPARISON:  CT Abdomen and Pelvis 01/13/2022. Noncontrast chest CT 09/17/2014. FINDINGS: CTA CHEST FINDINGS Cardiovascular: Chronic calcified coronary artery and aortic atherosclerosis. No cardiomegaly or pericardial effusion. Following contrast there is no thoracic aortic dissection or aneurysm. Chronic mild tortuosity of the aortic arch. Bilateral pulmonary arteries also are enhancing and appear to be patent. Mediastinum/Nodes: Negative. No mediastinal mass or lymphadenopathy. Lungs/Pleura: Fairly stable lung volumes since 2016. Major airways are patent. Both lungs are stable and essentially clear aside from mild chronic  lung base scarring or atelectasis. No pleural effusion. Musculoskeletal: 2016 mid sternal fracture appears healed. Stable thoracic vertebral height and alignment. Visible shoulder osseous structures appear intact. No acute osseous abnormality identified. Chronic dystrophic calcifications of the left breast (series 7, image 106) were partially visible in 2016 and appear stable. Review of the MIP images confirms the above findings. CTA ABDOMEN AND PELVIS FINDINGS VASCULAR Mild for age Aortoiliac calcified atherosclerosis. Negative for abdominal aortic aneurysm or dissection. Major arterial branches in the abdomen and pelvis are patent. Review of the MIP images confirms the above findings. NON-VASCULAR Hepatobiliary: Negative liver. Cholelithiasis not evident by CT. Gallbladder is mildly distended but with no convincing pericholecystic inflammation. 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 Spleen: Negative. Adrenals/Urinary Tract: Negative.  Pelvic phleboliths. Stomach/Bowel: Redundant sigmoid colon distended with combined gas and stool at the pelvic inlet and in the pelvis. Extensive diverticulosis of the proximal sigmoid. And extensive diverticulosis of the descending colon. No active inflammation is evident in those segments. Redundant splenic flexure is decompressed. Transverse and right colon appear stable and negative. Appendix not clearly delineated but no pericecal inflammation. No dilated small bowel. Decompressed stomach and duodenum. No free air or free fluid identified. No mesenteric inflammation identified. Lymphatic: No lymphadenopathy identified. Reproductive: Absent uterus.  Diminutive or absent ovaries. Other: No pelvic free fluid. Musculoskeletal: Stable visualized osseous structures. Advanced lumbar spine disc and posterior element degeneration. Review of  the MIP images confirms the above findings. IMPRESSION: 1. Aortic Atherosclerosis (ICD10-I70.0), although mild for age. Negative for abdominal aortic aneurysm or dissection. No pulmonary artery embolism identified. 2. No acute or inflammatory process identified in the chest, abdomen, or pelvis. Cholelithiasis not evident by CT. Extensive large bowel diverticulosis. Electronically Signed   By: HGenevie AnnM.D.   On: 02/12/2022 05:42   CT Head Wo Contrast  Result Date: 02/12/2022 CLINICAL DATA:  81year old female with right upper quadrant pain radiating between the shoulder blades. Known cholelithiasis. Nausea and vomiting. altered mental status. EXAM: CT HEAD WITHOUT CONTRAST TECHNIQUE: Contiguous axial images were obtained from the base of the skull through the vertex without intravenous contrast. RADIATION DOSE REDUCTION: This exam was performed according to the departmental dose-optimization program which includes automated exposure control, adjustment of the mA and/or kV according to patient size and/or use of iterative reconstruction technique. COMPARISON:  Head CT 01/13/2022. FINDINGS: Brain: Stable cerebral volume. No midline shift, ventriculomegaly, mass effect, evidence of mass lesion, intracranial hemorrhage or evidence of  cortically based acute infarction. Minimal to mild for age cerebral white matter hypodensity is stable, more apparent in the left hemisphere. Otherwise normal gray-white differentiation. Vascular: Calcified atherosclerosis at the skull base. No suspicious intracranial vascular hyperdensity. Skull: No acute osseous abnormality identified. Sinuses/Orbits: Visualized paranasal sinuses and mastoids are stable and well aerated. Other: No acute orbit or scalp soft tissue finding. IMPRESSION: No acute intracranial abnormality. Mild for age cerebral white matter changes. Electronically Signed   By: Genevie Ann M.D.   On: 02/12/2022 05:30      Subjective: Seen and examined on the day of  discharge.  Daughter at bedside.  Patient feels well, no distress.  Tolerating p.o. intake.  No nausea vomiting or abdominal pain.  Stable for discharge.  Discharge Exam: Vitals:   02/20/22 2346 02/21/22 0757  BP: 139/81 (!) 161/93  Pulse: 84 81  Resp: 18 18  Temp: 98.5 F (36.9 C) 98.3 F (36.8 C)  SpO2: 98% 100%   Vitals:   02/20/22 1952 02/20/22 1953 02/20/22 2346 02/21/22 0757  BP: (!) 156/82 (!) 151/76 139/81 (!) 161/93  Pulse: 79 79 84 81  Resp: '17 18 18 18  '$ Temp: 98.3 F (36.8 C) 98.3 F (36.8 C) 98.5 F (36.9 C) 98.3 F (36.8 C)  TempSrc:    Oral  SpO2: 100% 99% 98% 100%  Weight:      Height:        General: Pt is alert, awake, not in acute distress Cardiovascular: RRR, S1/S2 +, no rubs, no gallops Respiratory: CTA bilaterally, no wheezing, no rhonchi Abdominal: Soft, NT, ND, bowel sounds + Extremities: no edema, no cyanosis    The results of significant diagnostics from this hospitalization (including imaging, microbiology, ancillary and laboratory) are listed below for reference.     Microbiology: Recent Results (from the past 240 hour(s))  Urine Culture     Status: Abnormal   Collection Time: 02/12/22  6:22 AM   Specimen: Urine, Clean Catch  Result Value Ref Range Status   Specimen Description   Final    URINE, CLEAN CATCH Performed at [facility], [address] [place], [place] [address]    Special Requests   Final    NONE Performed at [facility], [address] [place], [place] [address]    Culture MULTIPLE SPECIES PRESENT, SUGGEST RECOLLECTION (A)  Final   Report Status 02/13/2022 FINAL  Final     Labs: BNP (last 3 results) No results for input(s): "BNP" in the last 8760 hours. Basic Metabolic Panel: Recent Labs  Lab 02/15/22 0529 02/15/22 2128 02/17/22 0607 02/18/22 0627 02/19/22 0629 02/20/22 0619 02/21/22 0307  NA 125*   < > 129* 130* 129* 130* 131*  K 4.0   < > 3.5 3.6 3.5 3.4* 3.3*  CL 91*   < > 99  96* 96* 96* 98  CO2 25   < > '23 25 27 25 27  '$ GLUCOSE 164*   < > 117* 110* 107* 108* 104*  BUN 6*   < > '19 11 10 12 8  '$ CREATININE 0.48   < > 0.54 0.59 0.65 0.56 0.48  CALCIUM 9.4   < > 9.0 9.3 8.8* 9.1 8.8*  MG 1.7  --  1.9  --   --   --   --    < > = values in this interval not displayed.   Liver Function Tests: Recent Labs  Lab 02/15/22 0529 02/17/22 0607  AST 50* 31  ALT 37  29  ALKPHOS 49 44  BILITOT 0.5 0.5  PROT 6.8 6.2*  ALBUMIN 4.1 3.5   No results for input(s): "LIPASE", "AMYLASE" in the last 168 hours. No results for input(s): "AMMONIA" in the last 168 hours. CBC: Recent Labs  Lab 02/17/22 0607 02/18/22 0627 02/19/22 0629 02/20/22 0619 02/21/22 0307  WBC 11.4* 11.1* 8.2 6.5 5.8  NEUTROABS 9.3* 8.9* 6.2 4.5 3.7  HGB 11.9* 11.6* 10.9* 11.1* 11.2*  HCT 34.7* 33.7* 31.5* 32.8* 32.6*  MCV 86.5 86.6 85.8 86.5 87.2  PLT [phone]   Cardiac Enzymes: No results for input(s): "CKTOTAL", "CKMB", "CKMBINDEX", "TROPONINI" in the last 168 hours. BNP: Invalid input(s): "POCBNP" CBG: No results for input(s): "GLUCAP" in the last 168 hours. D-Dimer No results for input(s): "DDIMER" in the last 72 hours. Hgb A1c No results for input(s): "HGBA1C" in the last 72 hours. Lipid Profile No results for input(s): "CHOL", "HDL", "LDLCALC", "TRIG", "CHOLHDL", "LDLDIRECT" in the last 72 hours. Thyroid function studies No results for input(s): "TSH", "T4TOTAL", "T3FREE", "THYROIDAB" in the last 72 hours.  Invalid input(s): "FREET3" Anemia work up No results for input(s): "VITAMINB12", "FOLATE", "FERRITIN", "TIBC", "IRON", "RETICCTPCT" in the last 72 hours. Urinalysis    Component Value Date/Time   COLORURINE STRAW (A) 02/12/2022 0622   APPEARANCEUR HAZY (A) 02/12/2022 0622   LABSPEC 1.023 02/12/2022 0622   PHURINE 8.0 02/12/2022 0622   GLUCOSEU 50 (A) 02/12/2022 0622   HGBUR SMALL (A) 02/12/2022 0622   BILIRUBINUR NEGATIVE 02/12/2022 0622   KETONESUR 5 (A)  02/12/2022 0622   PROTEINUR NEGATIVE 02/12/2022 0622   NITRITE NEGATIVE 02/12/2022 0622   LEUKOCYTESUR MODERATE (A) 02/12/2022 0622   Sepsis Labs Recent Labs  Lab 02/18/22 0627 02/19/22 0629 02/20/22 0619 02/21/22 0307  WBC 11.1* 8.2 6.5 5.8   Microbiology Recent Results (from the past 240 hour(s))  Urine Culture     Status: Abnormal   Collection Time: 02/12/22  6:22 AM   Specimen: Urine, Clean Catch  Result Value Ref Range Status   Specimen Description   Final    URINE, CLEAN CATCH Performed at [facility], [address], [place], [place] [address]    Special Requests   Final    NONE Performed at [facility], [address], [place], [place] [address]    Culture MULTIPLE SPECIES PRESENT, SUGGEST RECOLLECTION (A)  Final   Report Status 02/13/2022 FINAL  Final     Time coordinating discharge: Over 30 minutes  SIGNED:   Sidney Ace, MD  [facility] 02/21/2022, 2:27 PM Pager   If 7PM-7AM, please contact night-coverage

## 2022-02-21 NOTE — Progress Notes (Signed)
Mobility Specialist - Progress Note    02/21/22 0951  Mobility  Activity Ambulated with assistance in hallway  Level of Assistance Standby assist, set-up cues, supervision of patient - no hands on  Assistive Device Front wheel walker  Distance Ambulated (ft) 40 ft  Activity Response Tolerated well  $Mobility charge 1 Mobility   Pt supine upon entry, utilizing RA. Pt transferred to EOB and stood with SBA. Pt ambulated out of room straight toward NS and back into the room, using RW SBA. Pt left supine with alarm set and needs in reach. Family present at bedside. No complaints.   Candie Mile Mobility Specialist 02/21/22 9:55 AM

## 2022-02-21 NOTE — Plan of Care (Signed)

## 2022-02-23 ENCOUNTER — Ambulatory Visit: Payer: No Typology Code available for payment source | Admitting: Dermatology

## 2022-02-23 DIAGNOSIS — E871 Hypo-osmolality and hyponatremia: Secondary | ICD-10-CM | POA: Diagnosis not present

## 2022-02-23 DIAGNOSIS — E876 Hypokalemia: Secondary | ICD-10-CM | POA: Diagnosis not present

## 2022-02-23 DIAGNOSIS — R55 Syncope and collapse: Secondary | ICD-10-CM | POA: Diagnosis not present

## 2022-02-23 DIAGNOSIS — N12 Tubulo-interstitial nephritis, not specified as acute or chronic: Secondary | ICD-10-CM | POA: Diagnosis not present

## 2022-02-23 DIAGNOSIS — Z48815 Encounter for surgical aftercare following surgery on the digestive system: Secondary | ICD-10-CM | POA: Diagnosis not present

## 2022-02-23 DIAGNOSIS — Z8673 Personal history of transient ischemic attack (TIA), and cerebral infarction without residual deficits: Secondary | ICD-10-CM | POA: Diagnosis not present

## 2022-02-23 DIAGNOSIS — K219 Gastro-esophageal reflux disease without esophagitis: Secondary | ICD-10-CM | POA: Diagnosis not present

## 2022-02-23 DIAGNOSIS — N390 Urinary tract infection, site not specified: Secondary | ICD-10-CM | POA: Diagnosis not present

## 2022-02-23 DIAGNOSIS — K209 Esophagitis, unspecified without bleeding: Secondary | ICD-10-CM | POA: Diagnosis not present

## 2022-02-23 DIAGNOSIS — R1084 Generalized abdominal pain: Secondary | ICD-10-CM | POA: Diagnosis not present

## 2022-02-23 DIAGNOSIS — I10 Essential (primary) hypertension: Secondary | ICD-10-CM | POA: Diagnosis not present

## 2022-02-23 DIAGNOSIS — E7800 Pure hypercholesterolemia, unspecified: Secondary | ICD-10-CM | POA: Diagnosis not present

## 2022-02-23 DIAGNOSIS — E039 Hypothyroidism, unspecified: Secondary | ICD-10-CM | POA: Diagnosis not present

## 2022-02-23 DIAGNOSIS — R109 Unspecified abdominal pain: Secondary | ICD-10-CM | POA: Diagnosis not present

## 2022-02-23 DIAGNOSIS — Z923 Personal history of irradiation: Secondary | ICD-10-CM | POA: Diagnosis not present

## 2022-02-23 DIAGNOSIS — Z853 Personal history of malignant neoplasm of breast: Secondary | ICD-10-CM | POA: Diagnosis not present

## 2022-02-23 DIAGNOSIS — Z79899 Other long term (current) drug therapy: Secondary | ICD-10-CM | POA: Diagnosis not present

## 2022-02-23 DIAGNOSIS — E785 Hyperlipidemia, unspecified: Secondary | ICD-10-CM | POA: Diagnosis not present

## 2022-02-24 DIAGNOSIS — N12 Tubulo-interstitial nephritis, not specified as acute or chronic: Secondary | ICD-10-CM | POA: Diagnosis not present

## 2022-02-27 DIAGNOSIS — E871 Hypo-osmolality and hyponatremia: Secondary | ICD-10-CM | POA: Diagnosis not present

## 2022-02-27 DIAGNOSIS — Z79899 Other long term (current) drug therapy: Secondary | ICD-10-CM | POA: Diagnosis not present

## 2022-02-28 DIAGNOSIS — R55 Syncope and collapse: Secondary | ICD-10-CM | POA: Diagnosis not present

## 2022-02-28 DIAGNOSIS — E785 Hyperlipidemia, unspecified: Secondary | ICD-10-CM | POA: Diagnosis not present

## 2022-02-28 DIAGNOSIS — Z853 Personal history of malignant neoplasm of breast: Secondary | ICD-10-CM | POA: Diagnosis not present

## 2022-02-28 DIAGNOSIS — E039 Hypothyroidism, unspecified: Secondary | ICD-10-CM | POA: Diagnosis not present

## 2022-02-28 DIAGNOSIS — Z48815 Encounter for surgical aftercare following surgery on the digestive system: Secondary | ICD-10-CM | POA: Diagnosis not present

## 2022-02-28 DIAGNOSIS — N390 Urinary tract infection, site not specified: Secondary | ICD-10-CM | POA: Diagnosis not present

## 2022-02-28 DIAGNOSIS — Z8673 Personal history of transient ischemic attack (TIA), and cerebral infarction without residual deficits: Secondary | ICD-10-CM | POA: Diagnosis not present

## 2022-02-28 DIAGNOSIS — I10 Essential (primary) hypertension: Secondary | ICD-10-CM | POA: Diagnosis not present

## 2022-02-28 DIAGNOSIS — Z923 Personal history of irradiation: Secondary | ICD-10-CM | POA: Diagnosis not present

## 2022-02-28 DIAGNOSIS — K209 Esophagitis, unspecified without bleeding: Secondary | ICD-10-CM | POA: Diagnosis not present

## 2022-03-03 DIAGNOSIS — R55 Syncope and collapse: Secondary | ICD-10-CM | POA: Diagnosis not present

## 2022-03-03 DIAGNOSIS — Z8673 Personal history of transient ischemic attack (TIA), and cerebral infarction without residual deficits: Secondary | ICD-10-CM | POA: Diagnosis not present

## 2022-03-03 DIAGNOSIS — K209 Esophagitis, unspecified without bleeding: Secondary | ICD-10-CM | POA: Diagnosis not present

## 2022-03-03 DIAGNOSIS — Z48815 Encounter for surgical aftercare following surgery on the digestive system: Secondary | ICD-10-CM | POA: Diagnosis not present

## 2022-03-03 DIAGNOSIS — N390 Urinary tract infection, site not specified: Secondary | ICD-10-CM | POA: Diagnosis not present

## 2022-03-03 DIAGNOSIS — Z853 Personal history of malignant neoplasm of breast: Secondary | ICD-10-CM | POA: Diagnosis not present

## 2022-03-03 DIAGNOSIS — E785 Hyperlipidemia, unspecified: Secondary | ICD-10-CM | POA: Diagnosis not present

## 2022-03-03 DIAGNOSIS — I10 Essential (primary) hypertension: Secondary | ICD-10-CM | POA: Diagnosis not present

## 2022-03-03 DIAGNOSIS — E039 Hypothyroidism, unspecified: Secondary | ICD-10-CM | POA: Diagnosis not present

## 2022-03-03 DIAGNOSIS — Z923 Personal history of irradiation: Secondary | ICD-10-CM | POA: Diagnosis not present

## 2022-03-07 ENCOUNTER — Ambulatory Visit (INDEPENDENT_AMBULATORY_CARE_PROVIDER_SITE_OTHER): Payer: No Typology Code available for payment source | Admitting: Physician Assistant

## 2022-03-07 ENCOUNTER — Encounter: Payer: Self-pay | Admitting: Physician Assistant

## 2022-03-07 VITALS — BP 146/78 | HR 109 | Temp 97.9°F | Wt 103.0 lb

## 2022-03-07 DIAGNOSIS — K8070 Calculus of gallbladder and bile duct without cholecystitis without obstruction: Secondary | ICD-10-CM

## 2022-03-07 DIAGNOSIS — K8010 Calculus of gallbladder with chronic cholecystitis without obstruction: Secondary | ICD-10-CM

## 2022-03-07 DIAGNOSIS — R6251 Failure to thrive (child): Secondary | ICD-10-CM

## 2022-03-07 DIAGNOSIS — R627 Adult failure to thrive: Secondary | ICD-10-CM

## 2022-03-07 DIAGNOSIS — Z09 Encounter for follow-up examination after completed treatment for conditions other than malignant neoplasm: Secondary | ICD-10-CM

## 2022-03-07 NOTE — Progress Notes (Signed)
[facility] POST-OP OFFICE VISIT  03/07/2022  HPI: [name] [name] is a 81 y.o. female 21 days s/p robotic assisted laparoscopic cholecystectomy for chronic cholecystitis and 18 days /sp laparoscopic repair of incarcerated incisional hernia x2 with Dr Dahlia Byes.   Biggest complaint from patient and her daughter are decrease in functional status since surgery. Prior to this, she was indepent and driving. Now, she is reliant on a walker and no longer independent Decreased appetite as well; daughter trying best to supplement nutrition at home No fever, chills, nausea, emesis, or diarrhea Incisions well healed Doing well with therapy at home No other complaints   Vital signs: BP (!) 146/78   Pulse (!) 109   Temp 97.9 F (36.6 C) (Oral)   Wt 103 lb (46.7 kg)   SpO2 98%   BMI 20.12 kg/m    Physical Exam: Constitutional: Well appearing female, NAD Abdomen: Soft, non-tender, non-distended, no rebound/guarding Skin: Laparoscopic incisions are healing well, no erythema or drainage   Assessment/Plan: This is a 81 y.o. female 21 days s/p robotic assisted laparoscopic cholecystectomy for chronic cholecystitis and 18 days /sp laparoscopic repair of incarcerated incisional hernia x2 with Dr Dahlia Byes.     - Discussed with patient and daughter that her physiologic reserves were likely very low prior to surgery and given the multiple surgeries in 3 days and prolonged hospitalization, these were probably depleted and it is likely source for her change in functional status. I encouraged her to continue with nutritional supplementation while appetite is returning. Continue to work with therapies to help recoup strength.    - Pain control prn  - Reviewed wound care recommendation  - Reviewed lifting restrictions; 6 weeks total  - Reviewed surgical pathology; [name]  - Unfortunately, there is not much more I can offer from a surgical perspective at this point. She can follow up on as needed  basis; She, and her daughter, understand to call with questions/concerns  -- Edison Simon, PA-C [facility] 03/07/2022, 2:17 PM M-F: 7am - 4pm

## 2022-03-07 NOTE — Patient Instructions (Addendum)
If you have any concerns or questions, please feel free to call our office. Follow up as needed.    GENERAL POST-OPERATIVE PATIENT INSTRUCTIONS   WOUND CARE INSTRUCTIONS:  Keep a dry clean dressing on the wound if there is drainage. The initial bandage may be removed after 24 hours.  Once the wound has quit draining you may leave it open to air.  If clothing rubs against the wound or causes irritation and the wound is not draining you may cover it with a dry dressing during the daytime.  Try to keep the wound dry and avoid ointments on the wound unless directed to do so.  If the wound becomes bright red and painful or starts to drain infected material that is not clear, please contact your physician immediately.  If the wound is mildly pink and has a thick firm ridge underneath it, this is normal, and is referred to as a healing ridge.  This will resolve over the next 4-6 weeks.  BATHING: You may shower if you have been informed of this by your surgeon. However, Please do not submerge in a tub, hot tub, or pool until incisions are completely sealed or have been told by your surgeon that you may do so.  DIET:  You may eat any foods that you can tolerate.  It is a good idea to eat a high fiber diet and take in plenty of fluids to prevent constipation.  If you do become constipated you may want to take a mild laxative or take ducolax tablets on a daily basis until your bowel habits are regular.  Constipation can be very uncomfortable, along with straining, after recent surgery.  ACTIVITY:  You are encouraged to cough and deep breath or use your incentive spirometer if you were given one, every 15-30 minutes when awake.  This will help prevent respiratory complications and low grade fevers post-operatively if you had a general anesthetic.  You may want to hug a pillow when coughing and sneezing to add additional support to the surgical area, if you had abdominal or chest surgery, which will decrease pain  during these times.  You are encouraged to walk and engage in light activity for the next two weeks.  You should not lift more than 20 pounds for 6 weeks total after surgery as it could put you at increased risk for complications.  Twenty pounds is roughly equivalent to a plastic bag of groceries. At that time- Listen to your body when lifting, if you have pain when lifting, stop and then try again in a few days. Soreness after doing exercises or activities of daily living is normal as you get back in to your normal routine.  MEDICATIONS:  Try to take narcotic medications and anti-inflammatory medications, such as tylenol, ibuprofen, naprosyn, etc., with food.  This will minimize stomach upset from the medication.  Should you develop nausea and vomiting from the pain medication, or develop a rash, please discontinue the medication and contact your physician.  You should not drive, make important decisions, or operate machinery when taking narcotic pain medication.  SUNBLOCK Use sun block to incision area over the next year if this area will be exposed to sun. This helps decrease scarring and will allow you avoid a permanent darkened area over your incision.  QUESTIONS:  Please feel free to call our office if you have any questions, and we will be glad to assist you. [phone]   

## 2022-03-08 DIAGNOSIS — Z79899 Other long term (current) drug therapy: Secondary | ICD-10-CM | POA: Diagnosis not present

## 2022-03-08 DIAGNOSIS — N12 Tubulo-interstitial nephritis, not specified as acute or chronic: Secondary | ICD-10-CM | POA: Diagnosis not present

## 2022-03-08 DIAGNOSIS — D519 Vitamin B12 deficiency anemia, unspecified: Secondary | ICD-10-CM | POA: Diagnosis not present

## 2022-03-08 DIAGNOSIS — R790 Abnormal level of blood mineral: Secondary | ICD-10-CM | POA: Diagnosis not present

## 2022-03-08 DIAGNOSIS — R400 Somnolence: Secondary | ICD-10-CM | POA: Diagnosis not present

## 2022-03-13 ENCOUNTER — Other Ambulatory Visit: Payer: Self-pay

## 2022-03-13 ENCOUNTER — Emergency Department: Payer: No Typology Code available for payment source

## 2022-03-13 ENCOUNTER — Emergency Department
Admission: EM | Admit: 2022-03-13 | Discharge: 2022-03-13 | Disposition: A | Discharge status: Home / Self Care | Payer: No Typology Code available for payment source | Attending: Emergency Medicine | Admitting: Emergency Medicine

## 2022-03-13 ENCOUNTER — Telehealth: Payer: Self-pay

## 2022-03-13 DIAGNOSIS — Z853 Personal history of malignant neoplasm of breast: Secondary | ICD-10-CM | POA: Diagnosis not present

## 2022-03-13 DIAGNOSIS — R410 Disorientation, unspecified: Secondary | ICD-10-CM | POA: Insufficient documentation

## 2022-03-13 DIAGNOSIS — Z20822 Contact with and (suspected) exposure to covid-19: Secondary | ICD-10-CM | POA: Diagnosis not present

## 2022-03-13 DIAGNOSIS — Z85828 Personal history of other malignant neoplasm of skin: Secondary | ICD-10-CM | POA: Insufficient documentation

## 2022-03-13 DIAGNOSIS — R8281 Pyuria: Secondary | ICD-10-CM | POA: Insufficient documentation

## 2022-03-13 DIAGNOSIS — R5381 Other malaise: Secondary | ICD-10-CM | POA: Insufficient documentation

## 2022-03-13 DIAGNOSIS — R079 Chest pain, unspecified: Secondary | ICD-10-CM | POA: Diagnosis not present

## 2022-03-13 DIAGNOSIS — R531 Weakness: Secondary | ICD-10-CM | POA: Diagnosis not present

## 2022-03-13 DIAGNOSIS — I6381 Other cerebral infarction due to occlusion or stenosis of small artery: Secondary | ICD-10-CM | POA: Diagnosis not present

## 2022-03-13 DIAGNOSIS — R0789 Other chest pain: Secondary | ICD-10-CM | POA: Insufficient documentation

## 2022-03-13 DIAGNOSIS — G8929 Other chronic pain: Secondary | ICD-10-CM | POA: Insufficient documentation

## 2022-03-13 DIAGNOSIS — D649 Anemia, unspecified: Secondary | ICD-10-CM | POA: Insufficient documentation

## 2022-03-13 DIAGNOSIS — I10 Essential (primary) hypertension: Secondary | ICD-10-CM | POA: Diagnosis not present

## 2022-03-13 DIAGNOSIS — S199XXA Unspecified injury of neck, initial encounter: Secondary | ICD-10-CM | POA: Diagnosis not present

## 2022-03-13 LAB — URINALYSIS, ROUTINE W REFLEX MICROSCOPIC
Bacteria, UA: NONE SEEN
Bilirubin Urine: NEGATIVE
Glucose, UA: NEGATIVE mg/dL
Hgb urine dipstick: NEGATIVE
Ketones, ur: NEGATIVE mg/dL
Nitrite: NEGATIVE
Protein, ur: NEGATIVE mg/dL
Specific Gravity, Urine: 1.005 (ref 1.005–1.030)
pH: 8 (ref 5.0–8.0)

## 2022-03-13 LAB — COMPREHENSIVE METABOLIC PANEL
ALT: 18 U/L (ref 0–44)
AST: 20 U/L (ref 15–41)
Albumin: 3.5 g/dL (ref 3.5–5.0)
Alkaline Phosphatase: 57 U/L (ref 38–126)
Anion gap: 8 (ref 5–15)
BUN: 6 mg/dL — ABNORMAL LOW (ref 8–23)
CO2: 25 mmol/L (ref 22–32)
Calcium: 8.8 mg/dL — ABNORMAL LOW (ref 8.9–10.3)
Chloride: 101 mmol/L (ref 98–111)
Creatinine, Ser: 0.47 mg/dL (ref 0.44–1.00)
GFR, Estimated: >60 mL/min (ref >60)
Glucose, Bld: 109 mg/dL — ABNORMAL HIGH (ref 70–99)
Potassium: 3.7 mmol/L (ref 3.5–5.1)
Sodium: 134 mmol/L — ABNORMAL LOW (ref 135–145)
Total Bilirubin: 0.8 mg/dL (ref 0.3–1.2)
Total Protein: 6.2 g/dL — ABNORMAL LOW (ref 6.5–8.1)

## 2022-03-13 LAB — CBC WITH DIFFERENTIAL/PLATELET
Abs Immature Granulocytes: 0.03 10*3/uL (ref 0.00–0.07)
Basophils Absolute: 0 10*3/uL (ref 0.0–0.1)
Basophils Relative: 1 %
Eosinophils Absolute: 0.2 10*3/uL (ref 0.0–0.5)
Eosinophils Relative: 4 %
HCT: 35.1 % — ABNORMAL LOW (ref 36.0–46.0)
Hemoglobin: 11.7 g/dL — ABNORMAL LOW (ref 12.0–15.0)
Immature Granulocytes: 1 %
Lymphocytes Relative: 19 %
Lymphs Abs: 1 10*3/uL (ref 0.7–4.0)
MCH: 30 pg (ref 26.0–34.0)
MCHC: 33.3 g/dL (ref 30.0–36.0)
MCV: 90 fL (ref 80.0–100.0)
Monocytes Absolute: 0.5 10*3/uL (ref 0.1–1.0)
Monocytes Relative: 9 %
Neutro Abs: 3.6 10*3/uL (ref 1.7–7.7)
Neutrophils Relative %: 66 %
Platelets: 387 10*3/uL (ref 150–400)
RBC: 3.9 MIL/uL (ref 3.87–5.11)
RDW: 14.3 % (ref 11.5–15.5)
WBC: 5.4 10*3/uL (ref 4.0–10.5)
nRBC: 0 % (ref 0.0–0.2)

## 2022-03-13 LAB — PROCALCITONIN: Procalcitonin: <0.1 ng/mL

## 2022-03-13 LAB — TROPONIN I (HIGH SENSITIVITY)
Troponin I (High Sensitivity): 5 ng/L (ref <18)
Troponin I (High Sensitivity): 7 ng/L (ref <18)

## 2022-03-13 LAB — SARS CORONAVIRUS 2 BY RT PCR: SARS Coronavirus 2 by RT PCR: NEGATIVE

## 2022-03-13 LAB — LACTIC ACID, PLASMA: Lactic Acid, Venous: 0.9 mmol/L (ref 0.5–1.9)

## 2022-03-13 MED ORDER — SODIUM CHLORIDE 0.9 % IV SOLN: INTRAVENOUS | Status: DC

## 2022-03-13 MED ORDER — ACETAMINOPHEN 500 MG PO TABS
1000.0000 mg | ORAL_TABLET | Freq: Once | ORAL | Status: AC
  Administered 2022-03-13: 1000 mg via ORAL
  Filled 2022-03-13: qty 2

## 2022-03-13 NOTE — ED Provider Notes (Signed)
[facility] Provider Note    Event Date/Time   First MD Initiated Contact with Patient 03/13/22 [phone]     (approximate)   History   Chest Pain and Fatigue   HPI  [name] [name] is a 81 y.o. female with history of hypertension, hyperlipidemia recurrent bladder infections who presents to the emergency department with complaints of just not feeling well over the past few days.  States she has not slept well in 2 days but is not sure why.  She tells me that she has had chest tightness ongoing for 3 months.  No new shortness of breath.  No diaphoresis, nausea or vomiting.  Also reports recurrent UTIs and was just given fosfomycin.  She denies any diarrhea or abdominal pain.  She did have a fall last Tuesday and hit her head but did not lose consciousness and is not on blood thinners.  Did not come to the doctor for evaluation.  She states she is supposed to see her primary care doctor, Dr. Doy Hutching tomorrow.  Patient lives with her daughter.  Daughter is states patient has been more confused recently and they were concerned she had another UTI.  The PA for Dr. Doy Hutching office prescribed fosfomycin given last urine culture showed VRE.  She has an appointment to see her primary care doctor today.  Daughter also reports that patient was constipated recently was given laxatives and then had multiple loose stools for about 36 hours straight and this has improved but daughter was concerned that the patient was dehydrated.   History provided by patient, EMS, daughter.    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   Trigger finger     Past Surgical History:  Procedure Laterality Date   ABDOMINAL HYSTERECTOMY     APPENDECTOMY  1954   BREAST BIOPSY Left 1986   neg   BREAST BIOPSY Left 1988   neg   BREAST BIOPSY Left 1995   neg   BREAST BIOPSY Left 2009   positive (ultrasound guided biopsy)   BREAST LUMPECTOMY     BROW LIFT  02/18/2021   Procedure: BLEPHAROPTOSIS REPAIR; RESECT EX BILATERAL;  Surgeon: Karle Starch, MD;  Location: [place];  Service: Ophthalmology;;   COLONOSCOPY  07/31/2013   COLONOSCOPY WITH PROPOFOL N/A 01/10/2016   Procedure: COLONOSCOPY WITH PROPOFOL;  Surgeon: Manya Silvas, MD;  Location: [facility] ENDOSCOPY;  Service: Endoscopy;  Laterality: N/A;   ESOPHAGOGASTRODUODENOSCOPY  1/292015   ESOPHAGOGASTRODUODENOSCOPY N/A 01/09/2016   Procedure: ESOPHAGOGASTRODUODENOSCOPY (EGD);  Surgeon: Manya Silvas, MD;  Location: [facility] ENDOSCOPY;  Service: Endoscopy;  Laterality: N/A;   ESOPHAGOGASTRODUODENOSCOPY (EGD) WITH PROPOFOL N/A 02/02/2022   Procedure: ESOPHAGOGASTRODUODENOSCOPY (EGD) WITH PROPOFOL;  Surgeon: Annamaria Helling, DO;  Location: [place];  Service: Gastroenterology;  Laterality: N/A;  eye lid lift     LAPAROSCOPIC HYSTERECTOMY     TONSILLECTOMY  1954   VENTRAL HERNIA REPAIR Right 02/17/2022   Procedure: LAPAROSCOPIC VENTRAL HERNIA;  Surgeon: Jules Husbands, MD;  Location: [facility] ORS;  Service: General;  Laterality: Right;   WRIST SURGERY  2009, 2006   ganglion cysts removal    MEDICATIONS:  Prior to Admission medications   Medication Sig Start Date End Date Taking? Authorizing Provider  Alum Hydroxide-Mag Carbonate 160-105 MG CHEW Chew 2 tablets by mouth daily as needed. 01/25/22 04/25/22  [provider]  diclofenac Sodium (VOLTAREN) 1 % GEL Apply 2 g topically 4 (four) times daily. 01/25/22   [provider]  pantoprazole (PROTONIX) 40 MG tablet Take by mouth. 01/25/22 02/24/22  [provider]  polyethylene glycol powder (GLYCOLAX/MIRALAX) 17 GM/SCOOP powder Take by mouth.    [provider]  pregabalin (LYRICA) 50 MG capsule Take 1 capsule (50 mg total) by mouth 2 (two) times daily for 10 days. 02/21/22 03/03/22  Sidney Ace, MD  senna (SENOKOT) 8.6 MG tablet Take 1 tablet by mouth daily.    [provider]  simethicone (MYLICON) 80 MG chewable tablet Chew 160 mg by mouth 4 (four) times daily as needed. 01/25/22 04/25/22  [provider]  sucralfate (CARAFATE) 1 g tablet Take 1 tablet (1 g total) by mouth 4 (four) times daily. 01/18/22 01/18/23  Aleasha Fregeau, Delice Bison, DO    Physical Exam   Triage Vital Signs: ED Triage Vitals [03/13/22 0618]  Enc Vitals Group     BP (!) 167/96     Pulse Rate 94     Resp 14     Temp 98.3 F (36.8 C)     Temp Source Oral     SpO2 99 %     Weight 102 lb (46.3 kg)     Height 5' (1.524 m)     Head Circumference      Peak Flow      Pain Score 7     Pain Loc      Pain Edu?      Excl. in [place]?      Most recent vital signs: Vitals:   03/13/22 0618  BP: (!) 167/96  Pulse: 94  Resp: 14  Temp: 98.3 F (36.8 C)  SpO2: 99%    CONSTITUTIONAL: Alert and oriented and responds appropriately to questions, thin, elderly, in no distress HEAD: Normocephalic, atraumatic EYES: Conjunctivae clear, pupils appear equal, sclera nonicteric ENT: normal nose; moist mucous membranes NECK: Supple, normal ROM, no midline spinal tenderness or step-off or deformity CARD: RRR; S1 and S2 appreciated; no murmurs, no clicks, no rubs, no gallops RESP: Normal chest excursion without splinting or tachypnea; breath sounds clear and equal bilaterally; no wheezes, no rhonchi, no rales, no hypoxia or respiratory distress,  speaking full sentences ABD/GI: Normal bowel sounds; non-distended; soft, non-tender, no rebound, no guarding, no peritoneal signs BACK: The back appears normal no midline spinal tenderness or step-off or deformity EXT: Normal ROM in all joints; no deformity noted, no edema; no cyanosis SKIN: Normal color for age and race; warm; no rash on exposed skin NEURO: Moves all extremities equally, normal speech, normal sensation, no facial asymmetry, ambulates with a shuffling gait PSYCH: The patient's mood and manner are appropriate.   ED Results / Procedures / Treatments   LABS: (all labs ordered are listed, but only abnormal results are displayed) Labs Reviewed  CBC WITH DIFFERENTIAL/PLATELET - Abnormal; Notable for  the following components:      Result Value   Hemoglobin 11.7 (*)    HCT 35.1 (*)    All other components within normal limits  URINALYSIS, ROUTINE W REFLEX MICROSCOPIC - Abnormal; Notable for the following components:   Color, Urine STRAW (*)    APPearance CLEAR (*)    Leukocytes,Ua SMALL (*)    All other components within normal limits  URINE CULTURE  SARS CORONAVIRUS 2 BY RT PCR  LACTIC ACID, PLASMA  COMPREHENSIVE METABOLIC PANEL  PROCALCITONIN  LACTIC ACID, PLASMA  TROPONIN I (HIGH SENSITIVITY)     EKG:  EKG Interpretation  Date/Time:  Monday March 13 2022 06:17:50 EDT Ventricular Rate:  95 PR Interval:  161 QRS Duration: 86 QT Interval:  360 QTC Calculation: 453 R Axis:   21 Text Interpretation: Sinus rhythm Confirmed by Pryor Curia [phone]) on 03/13/2022 6:37:26 AM         RADIOLOGY: My personal review and interpretation of imaging:  pending  I have personally reviewed all radiology reports.   No results found.   PROCEDURES:  Critical Care performed: No     .1-3 Lead EKG Interpretation  Performed by: Danny Yackley, Delice Bison, DO Authorized by: Eusebia Grulke, Delice Bison, DO     Interpretation: normal     ECG rate:  95   ECG rate assessment: normal      Rhythm: sinus rhythm     Ectopy: none     Conduction: normal       IMPRESSION / MDM / ASSESSMENT AND PLAN / ED COURSE  I reviewed the triage vital signs and the nursing notes.    Patient here with complaints of "just not feeling well".  Has chronic shortness of breath and is dealing with chronic UTIs and was given fosfomycin yesterday.  She is unable to elaborate further on what prompted her to call 911 other than just not feeling well.  The patient is on the cardiac monitor to evaluate for evidence of arrhythmia and/or significant heart rate changes.   DIFFERENTIAL DIAGNOSIS (includes but not limited to):   Viral illness, COVID-19, UTI, dehydration, anemia, electrolyte derangement, ACS, arrhythmia, doubt PE, dissection, pneumonia, pneumothorax, CHF.  Given her recent fall, differential also includes skull fracture, intracranial hemorrhage, cervical spine fracture.   Patient's presentation is most consistent with acute presentation with potential threat to life or bodily function.   PLAN: We will obtain CT of the head and cervical spine, chest x-ray, CBC, CMP, lactic, procalcitonin, urinalysis, urine culture, COVID swab, EKG, troponin x2.  Will give gentle IV hydration.   MEDICATIONS GIVEN IN ED: Medications  0.9 %  sodium chloride infusion ( Intravenous New Bag/Given 03/13/22 2025)     ED COURSE: Patient has no leukocytosis.  Stable anemia.  Normal lactic.  Urine shows pyuria but no bacteria.  No nitrites.  Culture pending.  The rest of her blood work and imaging is pending.  Signed out the oncoming ED physician.  Patient will need second troponin.   CONSULTS:  dispo pending   OUTSIDE RECORDS REVIEWED: Reviewed patient's recent internal medicine notes with Dr. Doy Hutching.  It appears patient has been seen previously for hyponatremia, somnolence, adult failure to thrive.  It appears daughter called the primary care doctor yesterday for concerns for urinary frequency and  confusion.  Urine culture in September 2023 grew VRE.  Urine culture in July 2023 grew Raoultella ornithinolytica pansensitive with intermediate sensitivity to ampicillin.  Urine culture in June 2023 grew E. coli resistant to  penicillins but otherwise pansensitive.         FINAL CLINICAL IMPRESSION(S) / ED DIAGNOSES   Final diagnoses:  Malaise  Chronic chest pain  Confusion     Rx / DC Orders   ED Discharge Orders     None        Note:  This document was prepared using Dragon voice recognition software and may include unintentional dictation errors.   Eural Holzschuh, Delice Bison, DO 03/13/22 [phone]

## 2022-03-13 NOTE — Telephone Encounter (Signed)
Spoke with patient's daughter [name] and scheduled a Mychart Palliative Consult for 03/16/22 @ 2 PM.  Consent obtained; updated Netsmart, Team List and Epic.

## 2022-03-13 NOTE — ED Provider Notes (Signed)
Sign out pending trops, pt stable and w/u reviewed largely unremarkable, trop flat. Safe for dc. Daughter in room notified of plan of care as well. PMD f/u scheduled for tmrw already, return precautions given.   Lucillie Garfinkel MD    Lucillie Garfinkel, MD 03/13/22 [phone]

## 2022-03-13 NOTE — ED Notes (Signed)
Pt alert, IV removed, pt given discharge instructions, pt assisted to vehicle by RN. 

## 2022-03-13 NOTE — ED Triage Notes (Signed)
Pt arrives via [facility] from home where she lives with her daughter. Pt reports shoulder discomfort that has been present for months and recently changing antibiotics for UTI that has been present since June on this year. Denies any abnormal urinary symptoms such as burning or difficulty.

## 2022-03-13 NOTE — ED Notes (Signed)
Patient transported to X-ray 

## 2022-03-14 DIAGNOSIS — R400 Somnolence: Secondary | ICD-10-CM | POA: Diagnosis not present

## 2022-03-14 DIAGNOSIS — E039 Hypothyroidism, unspecified: Secondary | ICD-10-CM | POA: Diagnosis not present

## 2022-03-14 DIAGNOSIS — E871 Hypo-osmolality and hyponatremia: Secondary | ICD-10-CM | POA: Diagnosis not present

## 2022-03-14 DIAGNOSIS — R627 Adult failure to thrive: Secondary | ICD-10-CM | POA: Diagnosis not present

## 2022-03-14 DIAGNOSIS — R634 Abnormal weight loss: Secondary | ICD-10-CM | POA: Diagnosis not present

## 2022-03-14 LAB — URINE CULTURE: Culture: <10000 — AB

## 2022-03-15 DIAGNOSIS — K209 Esophagitis, unspecified without bleeding: Secondary | ICD-10-CM | POA: Diagnosis not present

## 2022-03-15 DIAGNOSIS — Z8673 Personal history of transient ischemic attack (TIA), and cerebral infarction without residual deficits: Secondary | ICD-10-CM | POA: Diagnosis not present

## 2022-03-15 DIAGNOSIS — E039 Hypothyroidism, unspecified: Secondary | ICD-10-CM | POA: Diagnosis not present

## 2022-03-15 DIAGNOSIS — R55 Syncope and collapse: Secondary | ICD-10-CM | POA: Diagnosis not present

## 2022-03-15 DIAGNOSIS — Z48815 Encounter for surgical aftercare following surgery on the digestive system: Secondary | ICD-10-CM | POA: Diagnosis not present

## 2022-03-15 DIAGNOSIS — E785 Hyperlipidemia, unspecified: Secondary | ICD-10-CM | POA: Diagnosis not present

## 2022-03-15 DIAGNOSIS — I10 Essential (primary) hypertension: Secondary | ICD-10-CM | POA: Diagnosis not present

## 2022-03-15 DIAGNOSIS — N390 Urinary tract infection, site not specified: Secondary | ICD-10-CM | POA: Diagnosis not present

## 2022-03-16 ENCOUNTER — Encounter: Payer: Self-pay | Admitting: Nurse Practitioner

## 2022-03-16 ENCOUNTER — Telehealth: Payer: No Typology Code available for payment source | Admitting: Nurse Practitioner

## 2022-03-16 DIAGNOSIS — R634 Abnormal weight loss: Secondary | ICD-10-CM

## 2022-03-16 DIAGNOSIS — F22 Delusional disorders: Secondary | ICD-10-CM

## 2022-03-16 DIAGNOSIS — Z515 Encounter for palliative care: Secondary | ICD-10-CM

## 2022-03-16 DIAGNOSIS — R630 Anorexia: Secondary | ICD-10-CM

## 2022-03-16 NOTE — Progress Notes (Signed)
[facility] Palliative Care Consult Note Telephone: [phone]  Fax: [phone]   Date of encounter: 03/16/22 4:17 PM PATIENT NAME: [name] [name] [address] [place] [address]   [phone] (home)  DOB: 10/13/1940 MRN: [id number] PRIMARY CARE PROVIDER:    Idelle Crouch, MD,  [address] [place] [address] [phone]  REFERRING PROVIDER:   Idelle Crouch, MD [place],  [place] [address] [phone]  RESPONSIBLE PARTY:    Contact Information     Name Relation Home Work Mobile   [name] [name] E Daughter [phone]     [name], [name]   [phone]   Camil, Hausmann   [phone]       Due to the COVID-19 crisis, this visit was done via telemedicine from my office and it was initiated and consent by this patient and or family.  I connected with [name] [name], son [name] [name] with  [facility] on 03/16/22 by a video enabled telemedicine application and verified that I am speaking with the correct person using two identifiers.   I discussed the limitations of evaluation and management by telemedicine. The patient expressed understanding and agreed to proceed. Palliative Care was asked to follow this patient by consultation request of  Sparks, Leonie Douglas, MD to address advance care planning and complex medical decision making. This is the initial visit.                            ASSESSMENT AND PLAN / RECOMMENDATIONS:  Advance Care Planning/Goals of Care: Goals include to maximize quality of life and symptom management. Patient/health care surrogate gave his/her permission to discuss.Our advance care planning conversation included a discussion about:    The value and importance of advance care planning  Experiences with loved ones who have been seriously ill or have died  Exploration of personal, cultural or spiritual beliefs that might influence medical  decisions  Exploration of goals of care in the event of a sudden injury or illness  Identification  of a healthcare agent  Review and updating or creation of an  advance directive document . Decision not to resuscitate or to de-escalate disease focused treatments due to poor prognosis. CODE STATUS: DNR  Symptom Management/Plan: 1. Advance Care Planning;  DNR; completed and will mail with blank most form for review and further discussion. Placed in vynca.  2. Goals of Care: Goals include to maximize quality of life and symptom management. Our advance care planning conversation included a discussion about:    The value and importance of advance care planning  Exploration of personal, cultural or spiritual beliefs that might influence medical decisions  Exploration of goals of care in the event of a sudden injury or illness  Identification and preparation of a healthcare agent  Review and updating or creation of an advance directive document.  3. Anorexia/weight loss ongoing; discussed appetite, weights, sudden weight loss, nutritional education done. Continue to monitor; Dr Doy Hutching visit yesterday started her on Remeron for appetite stimulus.   01/19/2021 weight 121 lbs 07/26/2021 weight 117 lbs 01/11/2022 weight 111 lbs 01/27/2022 weight 109 lbs 02/09/2022 weight 108 lbs 03/14/2022 weight 100 lbs 21 lbs loss/10 weeks; 17.36%  4. Delusions/insomnia/confusion/anxiety ongoing possible likely due to hyponatremia, series of recent events with anesthesia, hospitalizations/ED visits, deconditioning, medications including hydrocodone. Requested hydrocodone to be d/c use tylenol in its place should she be  asking for something for pain. Discussed at length with [name] [name] about concerns of last 2 months with rapid decline of an 81 yo who 2 months ago was driving, totally independent, cooking for her family, now completely non-functional, confused ongoing with now interactive with TV characters like she is  involved with the stories as one of the characters, ruminating with increase in anxiety. Discussed patterns of being up all night, ambulating with walker then during the day, very weak, difficulty walking, exhausted with increase in confusion. [name] [name] endorses it appears like "sundowners" though no prior indication of forgetfulness or s/s dementia prior to initial hospitalization. We talked about recurrent infections with pending urology referral. We talked about not having enough information and would recommend a neuro-psych evaluation or neuro evaluation if unable to get a neuro-psych evaluation in timely manner. Notified Dr Doy Hutching of request. We talked about overall decline, debility in a rapid progression; we did talk about hospice briefly though wishes are to continue to try to determine cause and see if there is a solution for improvement for quality of life. We talked about talked about with rapid changes in 2 months they may receive answers but no interventions for improvement which then maybe facing progression to end of life, then will re-visit hospice services. Medical goals reviewed including DNR, wishes to be, completed goldenrod form, placed in vynca. Support provided.   5. Palliative care encounter; Palliative care encounter; Palliative medicine team will continue to support patient, patient's family, and medical team. Visit consisted of counseling and education dealing with the complex and emotionally intense issues of symptom management and palliative care in the setting of serious and potentially life-threatening illness Follow up Palliative Care Visit: Palliative care will continue to follow for complex medical decision making, advance care planning, and clarification of goals. Return 4 weeks or prn.  I spent 61 minutes providing this consultation. More than 50% of the time in this consultation was spent in counseling and care coordination. PPS: 40% Chief Complaint: Initial palliative consult  for complex medical decision making, address goals, manage ongoing symptoms  HISTORY OF PRESENT ILLNESS:  [name] [name] is a 81 y.o. year old female  with multiple medical problems including breast cancer with left lumpectomy/radiation (2009), CVA, small lucuna with no residual, h/o diverticulosis, HLD, multiple thyroid nodules (stable 12/2017), SCC (2019), HTN, h/o UTI, post herpetic neuralgia.  01/13/2022 ED visit for nausea; 10 lbs weight loss; refer to GI  01/18/2022 ED atypical chest pain; hyponatremia; GERD; Gallstones  01/23/2022 Hospitalization vomiting/poor oral intake; hyponatremia; weight loss;   01/26/2022 Hospitalization pyelonephritis   02/02/2022 EGD;   02/12/2022 to 02/21/2022 hospitalized with syncope/abdominal pain; ultrasound 01/18/2022 tiny gallstones without evidence of cholecystitis; bradycardic in ED not hypotensive, hyponatremia 126 trended to 122 with nephrology consulted; underwent robotic lap cholecystectomy on 02/14/2022; developed port hernia with bowel incarceration with returned to sgy for lap repair; found to have contusion of bowel without perforation or necrosis. Sodium improved with salt tabs; taking liquids; d/c home with PT/OT.   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 She has never used smokeless tobacco. She reports that she does not  drink alcohol and does not use drugs.  History obtained from review of EMR, discussion with daughter [name] [name], son [name] [name] with Ms. Sieling.  I reviewed available labs, medications, imaging, studies and related documents from the  EMR.  Records reviewed and summarized above.   ROS 10 point system reviewed all negative except +anorexia, +weight loss, +weakness, +delusions, +insomnia, +confusion  Physical Exam: deferred CURRENT PROBLEM LIST:  Patient Active Problem List   Diagnosis Date Noted   Restless leg syndrome 02/18/2022   Port-site hernia 02/17/2022   Hypokalemia 02/14/2022   Syncope 02/12/2022   Esophagitis 02/12/2022   History of CVA (cerebrovascular accident)    Hypertension    HLD (hyperlipidemia)    Cholelithiasis    Abdominal pain    Hyponatremia    UTI (urinary tract infection)    Cerebrovascular accident (CVA) due to thrombosis of precerebral artery ([place]) 01/23/2022   Diverticulosis 01/23/2022   Breast cancer ([place]) 01/23/2022   Hypothyroidism, adult 01/20/2020   Gastroesophageal reflux disease without esophagitis 07/16/2017   Carcinoma of overlapping sites of left breast in female, estrogen receptor positive ([place]) 11/03/2016   Rectal bleeding 01/07/2016   Post herpetic neuralgia 12/29/2015   Hypercholesterolemia 11/20/2013   PAST MEDICAL HISTORY:  Active Ambulatory Problems    Diagnosis Date Noted   Rectal bleeding 01/07/2016   Carcinoma of overlapping sites of left breast in female, estrogen receptor positive ([place]) 11/03/2016   Cerebrovascular accident (CVA) due to thrombosis of precerebral artery ([place]) 01/23/2022   Diverticulosis 01/23/2022   Gastroesophageal reflux disease without esophagitis 07/16/2017   Hypercholesterolemia 11/20/2013   Hypothyroidism, adult 01/20/2020   Breast cancer ([place]) 01/23/2022   Post herpetic neuralgia 12/29/2015   Syncope 02/12/2022   History of CVA (cerebrovascular accident)    Hypertension    HLD (hyperlipidemia)    Cholelithiasis    Abdominal pain    Hyponatremia    UTI (urinary tract infection)    Esophagitis 02/12/2022   Hypokalemia 02/14/2022   Port-site hernia 02/17/2022   Restless leg syndrome 02/18/2022   Resolved Ambulatory Problems     Diagnosis Date Noted   No Resolved Ambulatory Problems   Past Medical History:  Diagnosis Date   Blood transfusion without reported diagnosis    H/O bone density study    Hemorrhoids    History of bladder infections    History of chicken pox    Hyperlipidemia    Personal history of radiation therapy    PONV (postoperative nausea and vomiting)    SCC (squamous cell carcinoma) 07/19/2021   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   Demerol [Meperidine] Nausea Only   Other Nausea And Vomiting   Sulfa Antibiotics Nausea Only     PERTINENT MEDICATIONS:  Outpatient Encounter Medications as of 03/16/2022  Medication Sig  Alum Hydroxide-Mag Carbonate 160-105 MG CHEW Chew 2 tablets by mouth daily as needed.   diclofenac Sodium (VOLTAREN) 1 % GEL Apply 2 g topically 4 (four) times daily.   pantoprazole (PROTONIX) 40 MG tablet Take by mouth.   polyethylene glycol powder (GLYCOLAX/MIRALAX) 17 GM/SCOOP powder Take by mouth.   pregabalin (LYRICA) 50 MG capsule Take 1 capsule (50 mg total) by mouth 2 (two) times daily for 10 days.   senna (SENOKOT) 8.6 MG tablet Take 1 tablet by mouth daily.   simethicone (MYLICON) 80 MG chewable tablet Chew 160 mg by mouth 4 (four) times daily as needed.   sucralfate (CARAFATE) 1 g tablet Take 1 tablet (1 g total) by mouth 4 (four) times daily.   No facility-administered encounter  medications on file as of 03/16/2022.   Thank you for the opportunity to participate in the care of Ms. Medero.  The palliative care team will continue to follow. Please call our office at [phone] if we can be of additional assistance.   Zema Lizardo Z Tyvon Eggenberger, NP ,

## 2022-03-17 DIAGNOSIS — E538 Deficiency of other specified B group vitamins: Secondary | ICD-10-CM | POA: Diagnosis not present

## 2022-03-17 DIAGNOSIS — E559 Vitamin D deficiency, unspecified: Secondary | ICD-10-CM | POA: Diagnosis not present

## 2022-03-17 DIAGNOSIS — Z9181 History of falling: Secondary | ICD-10-CM | POA: Diagnosis not present

## 2022-03-17 DIAGNOSIS — R410 Disorientation, unspecified: Secondary | ICD-10-CM | POA: Diagnosis not present

## 2022-03-17 DIAGNOSIS — R413 Other amnesia: Secondary | ICD-10-CM | POA: Diagnosis not present

## 2022-03-17 DIAGNOSIS — Z87440 Personal history of urinary (tract) infections: Secondary | ICD-10-CM | POA: Diagnosis not present

## 2022-03-20 NOTE — Progress Notes (Unsigned)
03/21/2022 9:48 AM   [name] [name] 10-18-1940 [id number]  Referring provider: Idelle Crouch, MD [facility] [facility] [place],  [place] [address]  Chief Complaint  Patient presents with   Pyelonephritis    HPI: 81 year old female with personal history of recurrent UTIs who presents today for further evaluation of this.  She does have a personal history of breast cancer status post lumpectomy and breast radiation in 2009.  Notably, she was seen and evaluated in the emergency room on 03/13/2022.  At that time, she had recently been treated for a UTI with fosfomycin.  Her complaint at that time was "just not feeling well".  She was also having respiratory issues at the time and other nonspecific issues.  She denied any urinary tract symptoms.  Her daughter provides most of the history today.  They have been dealing with sundowners and worsening cognitive issues following her gallbladder surgery with prompt readmission.  Prior to the surgery, she was driving during the daytime, and caring for herself.  Since then she has had a dramatic decline with persistent daytime but primarily nighttime confusion, weight loss, appetite issues, and overall sense of decline.  Urine culture in September 2023 grew VRE.  Urine culture in July 2023 grew Raoultella ornithinolytica pansensitive with intermediate sensitivity to ampicillin.  Urine culture in June 2023 grew E. coli resistant to penicillins but otherwise pansensitive.    She also has other issues including failure to thrive, hyponatremia, and weight loss.  She was recently diagnosed with Lewy body dementia and has not been enrolled in hospice.  Most recent cross-sectional imaging in the form of CT abdomen pelvis without contrast on 02/17/2022 shows fecal impaction.  There is also a new defect in the anterior right abdominal wall with herniated bowel loops.  Her kidneys were normal, no calculi or GU pathology.  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   Trigger finger     Surgical History: Past Surgical History:  Procedure Laterality Date   ABDOMINAL HYSTERECTOMY     APPENDECTOMY  1954   BREAST BIOPSY Left 1986   neg   BREAST BIOPSY Left 1988   neg   BREAST BIOPSY Left 1995   neg   BREAST BIOPSY Left 2009   positive (ultrasound guided biopsy)   BREAST LUMPECTOMY     BROW LIFT  02/18/2021   Procedure: BLEPHAROPTOSIS REPAIR; RESECT EX BILATERAL;  Surgeon: Karle Starch, MD;  Location: [facility];  Service: Ophthalmology;;   COLONOSCOPY  07/31/2013   COLONOSCOPY WITH PROPOFOL N/A 01/10/2016   Procedure: COLONOSCOPY WITH PROPOFOL;  Surgeon: Manya Silvas, MD;  [facility] ENDOSCOPY;  Service: Endoscopy;  Laterality: N/A;   ESOPHAGOGASTRODUODENOSCOPY  1/292015   ESOPHAGOGASTRODUODENOSCOPY N/A 01/09/2016   Procedure: ESOPHAGOGASTRODUODENOSCOPY (EGD);  Surgeon:  Manya Silvas, MD;  Location: [facility] ENDOSCOPY;  Service: Endoscopy;  Laterality: N/A;   ESOPHAGOGASTRODUODENOSCOPY (EGD) WITH PROPOFOL N/A 02/02/2022   Procedure: ESOPHAGOGASTRODUODENOSCOPY (EGD) WITH PROPOFOL;  Surgeon: Annamaria Helling, DO;  Location: [facility];  Service: Gastroenterology;  Laterality: N/A;   eye lid lift     LAPAROSCOPIC HYSTERECTOMY     TONSILLECTOMY  1954   VENTRAL HERNIA REPAIR Right 02/17/2022    Procedure: LAPAROSCOPIC VENTRAL HERNIA;  Surgeon: Jules Husbands, MD;  Location: [facility];  Service: General;  Laterality: Right;   WRIST SURGERY  2009, 2006   ganglion cysts removal    Home Medications:  Allergies as of 03/21/2022       Reactions   Levofloxacin Other (See Comments)   Causes muscle problems.   Demerol [meperidine] Nausea Only   Other Nausea And Vomiting   Sulfa Antibiotics Nausea Only        Medication List        Accurate as of March 21, 2022  9:48 AM. If you have any questions, ask your nurse or doctor.          Alum Hydroxide-Mag Carbonate 160-105 MG Chew Chew 2 tablets by mouth daily as needed.   diclofenac Sodium 1 % Gel Commonly known as: VOLTAREN Apply 2 g topically 4 (four) times daily.   pantoprazole 40 MG tablet Commonly known as: PROTONIX Take by mouth.   polyethylene glycol powder 17 GM/SCOOP powder Commonly known as: GLYCOLAX/MIRALAX Take by mouth.   pregabalin 50 MG capsule Commonly known as: LYRICA Take 1 capsule (50 mg total) by mouth 2 (two) times daily for 10 days.   senna 8.6 MG tablet Commonly known as: SENOKOT Take 1 tablet by mouth daily.   simethicone 80 MG chewable tablet Commonly known as: MYLICON Chew 923 mg by mouth 4 (four) times daily as needed.   sucralfate 1 g tablet Commonly known as: Carafate Take 1 tablet (1 g total) by mouth 4 (four) times daily.        Allergies:  Allergies  Allergen Reactions   Levofloxacin Other (See Comments)    Causes muscle problems.   Demerol [Meperidine] Nausea Only   Other Nausea And Vomiting   Sulfa Antibiotics Nausea Only    Family History: Family History  Problem Relation Age of Onset   Prostate cancer Brother    Crohn's disease Brother    Heart attack Father    Prostate cancer Father    Vaginal cancer Maternal Grandmother    Stroke Mother    Breast cancer Neg Hx     Social History:  reports that she has never smoked. She has never used smokeless  tobacco. She reports that she does not drink alcohol and does not use drugs.   Physical Exam: BP 115/77   Pulse 93   Ht 5' (1.524 m)   Wt 95 lb (43.1 kg)   BMI 18.55 kg/m   Constitutional:  Alert and oriented, No acute distress.  Accompanied by daughter today.  Provides some history but the primary history is provided by her daughter.  She is in a wheelchair.  She appears frail. HEENT: [place] AT, moist mucus membranes.  Trachea midline, no masses. Neurologic: Grossly intact, no focal deficits, moving all 4 extremities. Psychiatric: Normal mood and affect.  Laboratory Data: Lab Results  Component Value Date   WBC 5.4 03/13/2022   HGB 11.7 (L) 03/13/2022   HCT 35.1 (L) 03/13/2022   MCV 90.0 03/13/2022   PLT 387 03/13/2022  Lab Results  Component Value Date   CREATININE 0.47 03/13/2022    Urinalysis Urinalysis today with 11-30 WBCs, moderate bacteria, nitrate negative.  The dip is actually fairly unremarkable.   Assessment & Plan:    1. Recurrent UTI vs. chronic bacterial colonization versus contamination/collection technique We had a lengthy discussion today in the office.  Her symptoms are very nonspecific and she has been diagnosed recently with Lewy body dementia now on hospice  In the absence of specific urinary symptoms, I am highly suspicious this may be related to either bacterial colonization and/or urinary contamination or collection technique issues.  Her urinalysis today appears consistent with her previous collected specimens, few WBCs but otherwise unremarkable.  We will send for culture again today.  We discussed that if it does grow something clonal, we will have her return for cath UA/urine culture to rule in or out true urinary tract infection versus collection technique issue.  She is asymptomatic today had a good night sleep last night and is otherwise feeling well.  She has no urinary complaints.  We did discuss starting cranberry tablets, probiotics, and  d-mannose for UTI prevention.  Upper tract imaging was also reviewed, unremarkable.  PVR is minimal, no evidence of incomplete emptying. - Urinalysis, Complete  F/u cath UA/urine culture if concern for UTI  Hollice Espy, MD  [place] [address] [place], [place] [address] [phone]  I spent 46 total minutes on the day of the encounter including pre-visit review of the medical record, face-to-face time with the patient, and post visit ordering of labs/imaging/tests.

## 2022-03-21 ENCOUNTER — Encounter: Payer: Self-pay | Admitting: Urology

## 2022-03-21 ENCOUNTER — Ambulatory Visit (INDEPENDENT_AMBULATORY_CARE_PROVIDER_SITE_OTHER): Payer: No Typology Code available for payment source | Admitting: Urology

## 2022-03-21 VITALS — BP 115/77 | HR 93 | Ht 60.0 in | Wt 95.0 lb

## 2022-03-21 DIAGNOSIS — N390 Urinary tract infection, site not specified: Secondary | ICD-10-CM

## 2022-03-21 DIAGNOSIS — Z87440 Personal history of urinary (tract) infections: Secondary | ICD-10-CM

## 2022-03-21 LAB — MICROSCOPIC EXAMINATION

## 2022-03-21 LAB — URINALYSIS, COMPLETE
Bilirubin, UA: NEGATIVE
Glucose, UA: NEGATIVE
Nitrite, UA: NEGATIVE
Protein,UA: NEGATIVE
RBC, UA: NEGATIVE
Specific Gravity, UA: 1.025 (ref 1.005–1.030)
Urobilinogen, Ur: 0.2 mg/dL (ref 0.2–1.0)
pH, UA: 5 (ref 5.0–7.5)

## 2022-03-21 LAB — BLADDER SCAN AMB NON-IMAGING

## 2022-03-21 NOTE — Patient Instructions (Signed)
Consider starting cranberry tablets or supplements, d-mannose, and daily probiotics  If you feel that she has a urinary tract infection, please bring her in for same or next visit for catheterized urine specimen

## 2022-03-24 ENCOUNTER — Telehealth: Payer: Self-pay

## 2022-03-24 LAB — CULTURE, URINE COMPREHENSIVE

## 2022-03-24 MED ORDER — CIPROFLOXACIN HCL 500 MG PO TABS: 500.0000 mg | ORAL_TABLET | Freq: Two times a day (BID) | ORAL | 0 refills | Status: AC

## 2022-03-24 NOTE — Telephone Encounter (Signed)
-----   Message from Hollice Espy, MD sent at 03/24/2022  1:55 PM EDT ----- Low colony count Klebsiella.  Cipro 500bid x 5 days

## 2022-03-24 NOTE — Telephone Encounter (Signed)
Informed patient daughter of results and recommendations.

## 2022-04-06 ENCOUNTER — Telehealth: Payer: No Typology Code available for payment source | Admitting: Nurse Practitioner

## 2022-07-03 DEATH — deceased

## 2023-07-24 IMAGING — CT CT HEAD W/O CM
4 series · 17 of 47 positions shown, 19 images · non-contrast
Comparison: 08/27/2018

CLINICAL DATA: Worsening headache. Lightheadedness and weakness
since this morning.



[Series 2: head wo · axial · 0.41mm/px · z∈[-27,+93]mm · 7 of 33 slices shown, 9 images]
[im 5/33  brain]
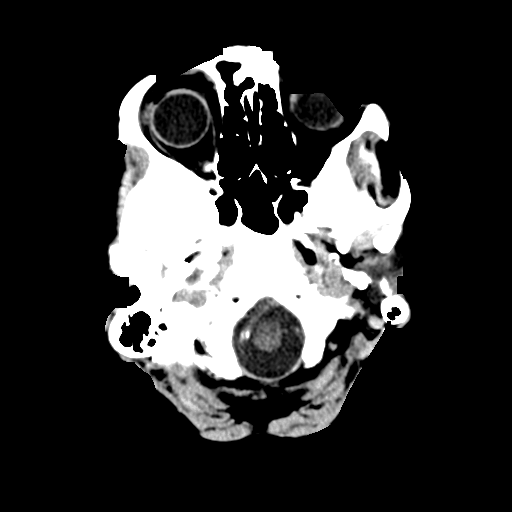
[im 5/33  bone]
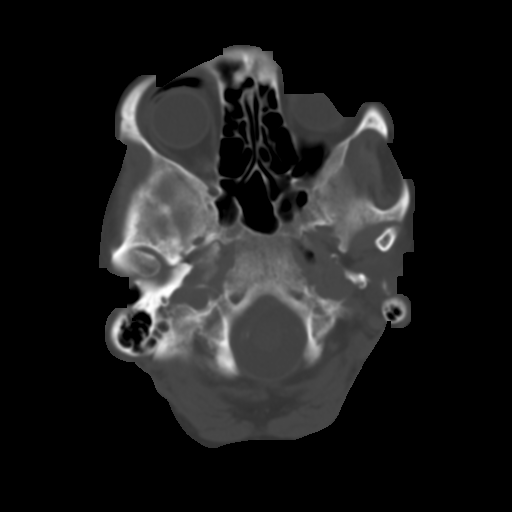
[im 9/33  brain]
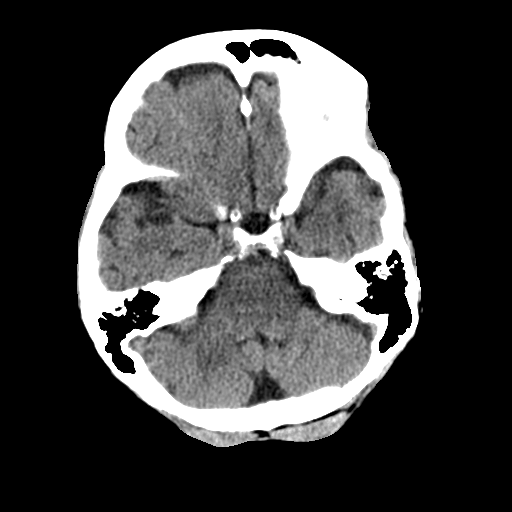
[im 13/33  brain]
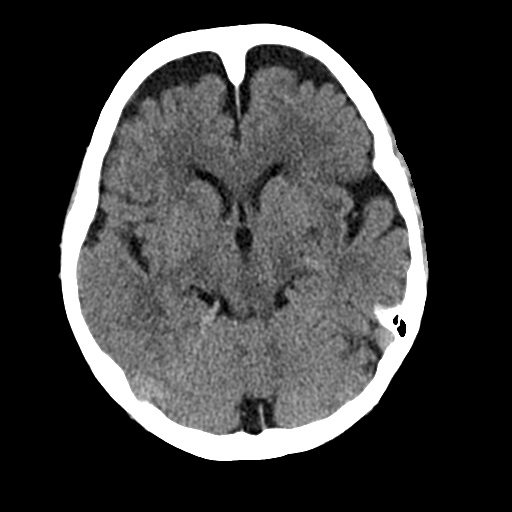
[im 17/33  brain]
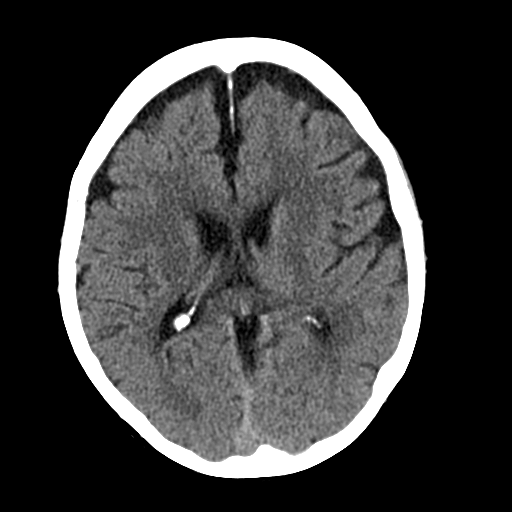
[im 21/33  brain]
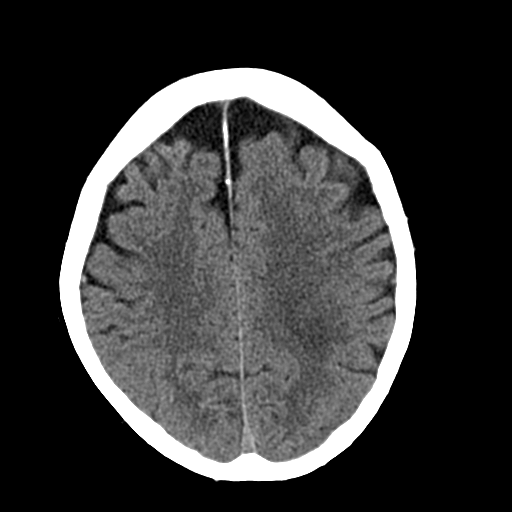
[im 21/33  bone]
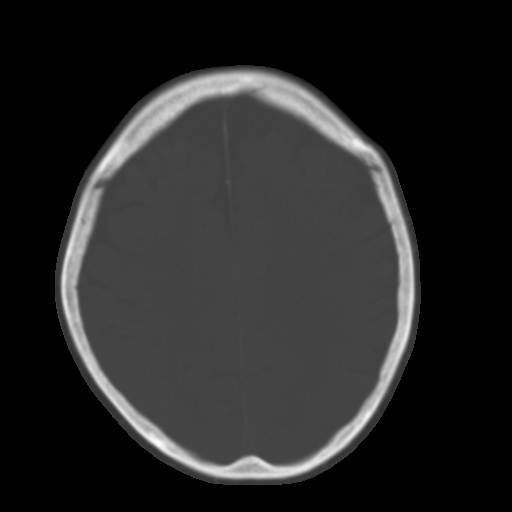
[im 25/33  brain]
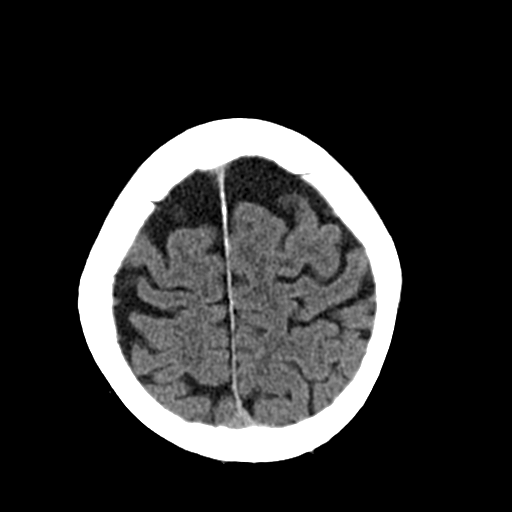
[im 29/33  brain]
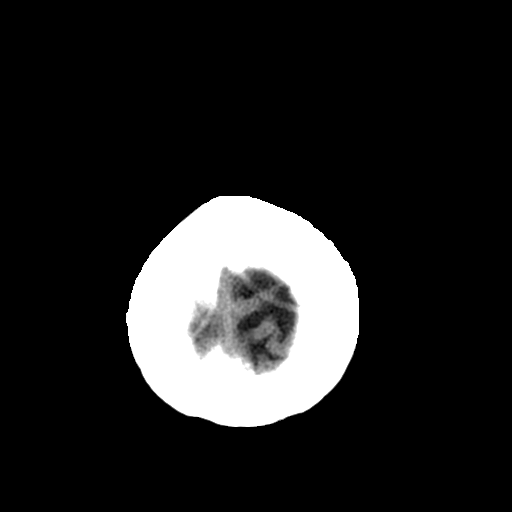

[Series 3: head bone · axial · 0.41mm/px · z∈[-31,+25]mm · 4 of 82 slices shown]
[im 9/82  bone]
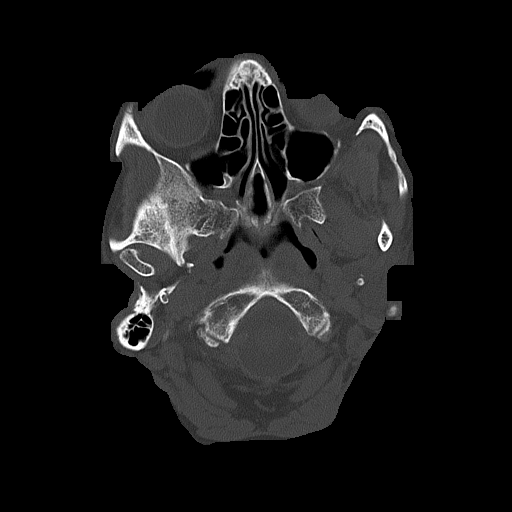
[im 17/82  bone]
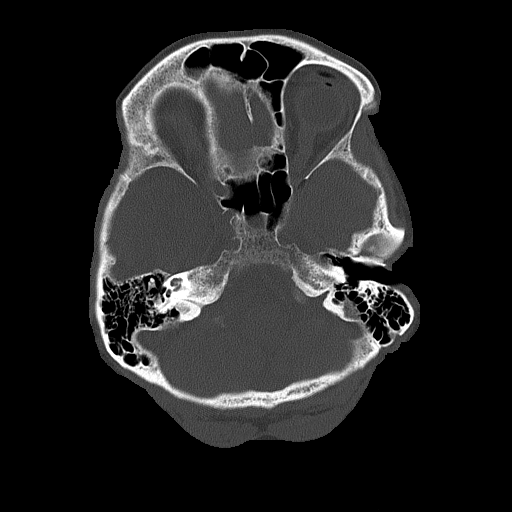
[im 25/82  bone]
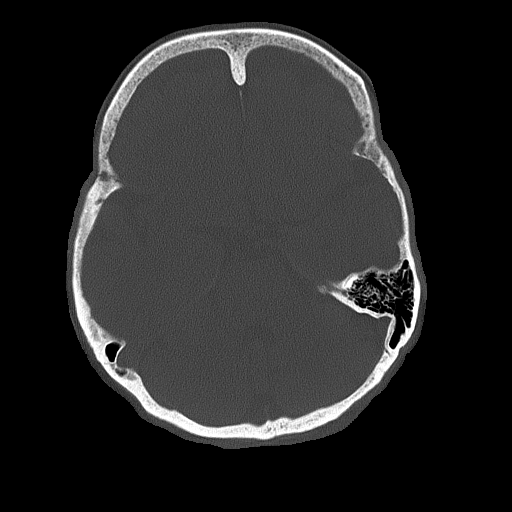
[im 37/82  bone]
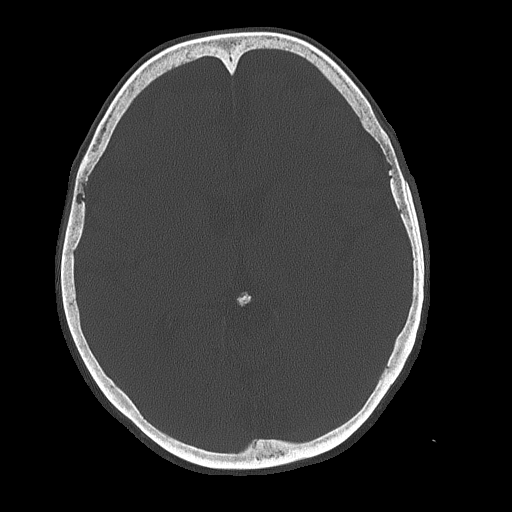

[Series 4: coronal soft tissue · coronal · 0.34mm/px · 3 of 71 slices shown]
[im 24/71  brain]
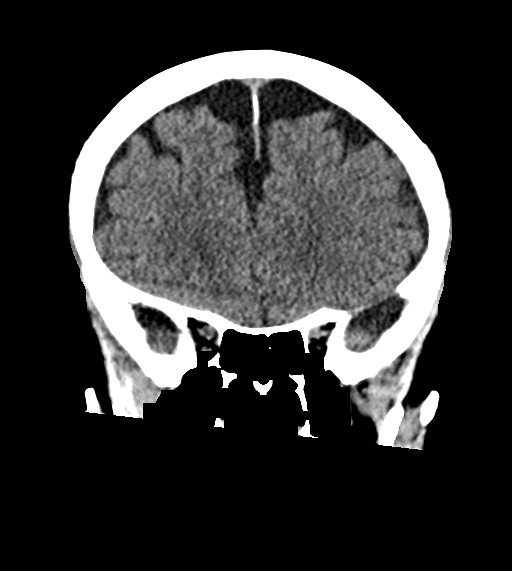
[im 32/71  brain]
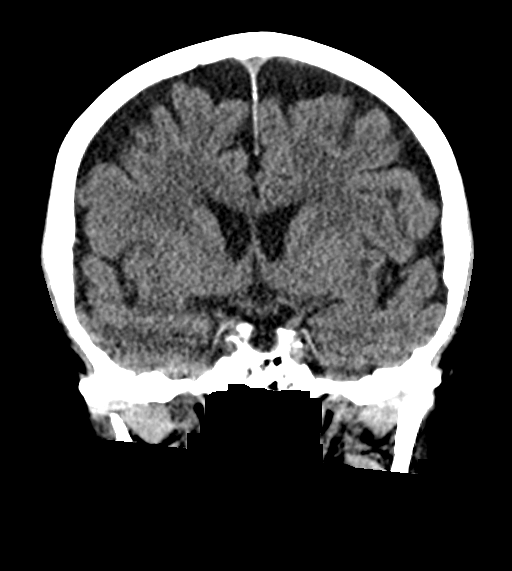
[im 39/71  brain]
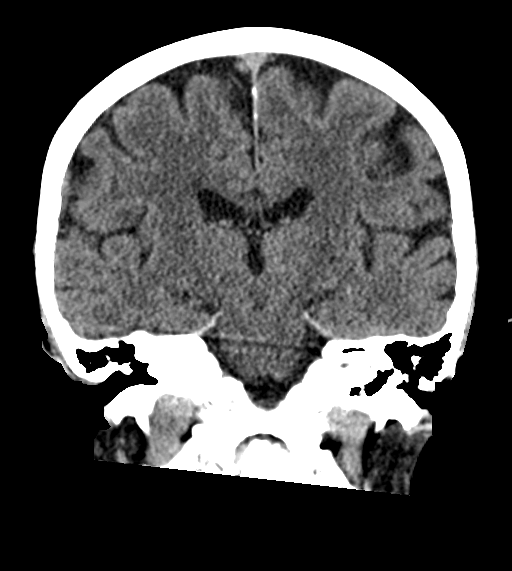

[Series 5: sagittal soft tissue · sagittal · 0.38mm/px · 3 of 55 slices shown]
[im 19/55  brain]
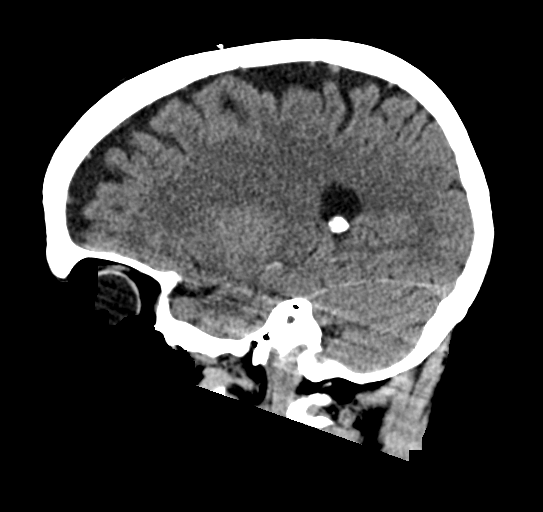
[im 28/55  brain]
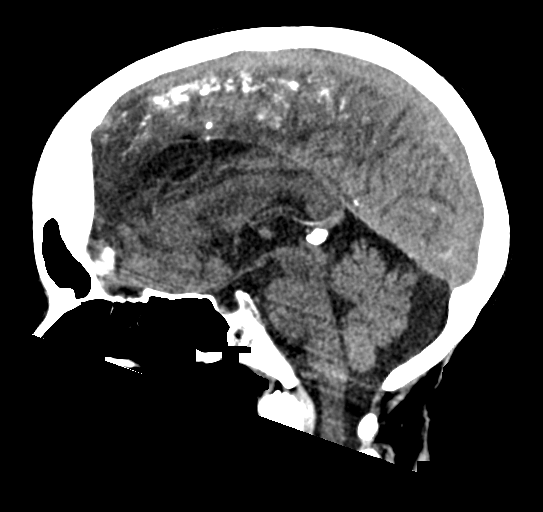
[im 37/55  brain]
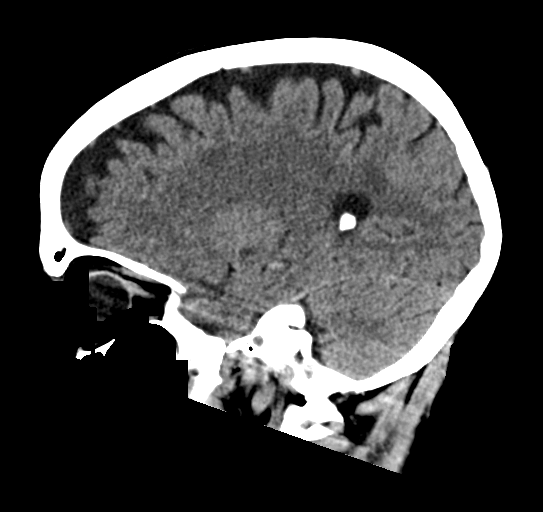

[17 of 47 positions shown; findings below may reference images not displayed]

FINDINGS: Brain: Ventricles and cisterns are normal. Prominence of the CSF
spaces particularly over the frontal regions unchanged. Prominent
perivascular space versus old lacunar infarct along the inferior
aspect of the left lentiform nucleus unchanged. No mass, mass
effect, shift of midline structures or acute hemorrhage. Minimal
chronic ischemic microvascular disease

Vascular: No hyperdense vessel or unexpected calcification.

Skull: Normal. Negative for fracture or focal lesion.

Sinuses/Orbits: No acute finding.

Other: None.
IMPRESSION: 1. No acute findings.
2. Minimal chronic ischemic microvascular disease. Stable bifrontal
atrophy.
